# Patient Record
Sex: Female | Born: 1945 | Race: White | Hispanic: No | Marital: Married | Smoking: Former smoker
Health system: Western US, Academic
[De-identification: ages and names within clinical notes are randomized; demographics above are authoritative.]

## PROBLEM LIST (undated history)

## (undated) DIAGNOSIS — M8580 Other specified disorders of bone density and structure, unspecified site: Secondary | ICD-10-CM

## (undated) DIAGNOSIS — G8929 Other chronic pain: Secondary | ICD-10-CM

## (undated) DIAGNOSIS — N3281 Overactive bladder: Secondary | ICD-10-CM

## (undated) DIAGNOSIS — F209 Schizophrenia, unspecified: Secondary | ICD-10-CM

## (undated) DIAGNOSIS — E039 Hypothyroidism, unspecified: Secondary | ICD-10-CM

## (undated) DIAGNOSIS — G4700 Insomnia, unspecified: Secondary | ICD-10-CM

## (undated) DIAGNOSIS — F3130 Bipolar disorder, current episode depressed, mild or moderate severity, unspecified: Secondary | ICD-10-CM

## (undated) DIAGNOSIS — F429 Obsessive-compulsive disorder, unspecified: Secondary | ICD-10-CM

## (undated) HISTORY — DX: Schizophrenia, unspecified (CMS-HCC): F20.9

## (undated) HISTORY — DX: Other chronic pain: G89.29

## (undated) HISTORY — DX: Obsessive-compulsive disorder, unspecified: F42.9

## (undated) HISTORY — DX: Other specified disorders of bone density and structure, unspecified site: M85.80

## (undated) HISTORY — DX: Insomnia, unspecified: G47.00

## (undated) HISTORY — PX: TOTAL KNEE ARTHROPLASTY: SHX125

## (undated) HISTORY — DX: Hypothyroidism, unspecified: E03.9

## (undated) HISTORY — DX: Bipolar disorder, current episode depressed, mild or moderate severity, unspecified (CMS-HCC): F31.30

## (undated) HISTORY — PX: OTHER PROCEDURE: U1053

## (undated) HISTORY — DX: Overactive bladder: N32.81

## (undated) MED ORDER — TRIHEXYPHENIDYL HCL 2 MG OR TABS
ORAL_TABLET | ORAL | Status: AC
Start: 2018-04-29 — End: ?

## (undated) MED ORDER — TRIHEXYPHENIDYL HCL 1 MG OR (HALF TABLET)
1.00 mg | ORAL_TABLET | Freq: Two times a day (BID) | ORAL | 3 refills | Status: AC
Start: 2018-09-17 — End: 2019-01-15

## (undated) MED ORDER — DESVENLAFAXINE SUCCINATE 50 MG OR TB24
50.00 mg | ORAL_TABLET | Freq: Every day | ORAL | 3 refills | Status: AC
Start: 2018-09-17 — End: ?

## (undated) MED ORDER — BUPROPION XL (DAILY) 150 MG OR TB24
150.0000 mg | ORAL_TABLET | Freq: Every morning | ORAL | 0 refills | Status: AC
Start: 2019-05-26 — End: ?

## (undated) MED ORDER — ZONISAMIDE 100 MG OR CAPS
ORAL_CAPSULE | ORAL | 1 refills | Status: AC
Start: 2018-04-18 — End: ?

## (undated) MED ORDER — GABAPENTIN 600 MG OR TABS
1200.00 mg | ORAL_TABLET | Freq: Every evening | ORAL | 3 refills | Status: AC
Start: 2018-09-17 — End: 2019-09-12

## (undated) MED ORDER — SODIUM CHLORIDE FLUSH 0.9 % IV SOLN
10.0000 mL | Freq: Once | INTRAVENOUS | Status: AC
Start: 2019-07-07 — End: 2019-07-07

## (undated) MED ORDER — SODIUM CHLORIDE FLUSH 0.9 % IV SOLN
10.00 mL | Freq: Once | INTRAVENOUS | Status: AC
Start: 2018-12-16 — End: 2018-12-16

## (undated) MED ORDER — PERFLUTREN LIPID MICROSPHERE 1.1 MG/ML IV SUSP
0.4000 mL | Freq: Once | INTRAVENOUS | Status: AC
Start: 2019-07-07 — End: 2019-07-07

## (undated) MED ORDER — SERTRALINE HCL 100 MG OR TABS
100.00 mg | ORAL_TABLET | Freq: Every day | ORAL | 3 refills | Status: AC
Start: 2018-09-17 — End: ?

## (undated) MED ORDER — FLUPHENAZINE HCL 2.5 MG OR TABS
2.50 mg | ORAL_TABLET | Freq: Two times a day (BID) | ORAL | Status: AC
Start: 2018-01-31 — End: ?

## (undated) MED ORDER — PERFLUTREN LIPID MICROSPHERE 1.1 MG/ML IV SUSP
0.40 mL | Freq: Once | INTRAVENOUS | Status: AC
Start: 2018-12-16 — End: 2018-12-16

## (undated) MED ORDER — BUPROPION XL (DAILY) 150 MG OR TB24
ORAL_TABLET | ORAL | 0 refills | Status: AC
Start: 2019-05-26 — End: ?

## (undated) MED ORDER — BUPROPION XL (DAILY) 150 MG OR TB24
150.0000 mg | ORAL_TABLET | Freq: Every morning | ORAL | 0 refills | Status: AC
Start: 2019-05-23 — End: ?

## (undated) MED ORDER — GABAPENTIN 600 MG OR TABS
1200.0000 mg | ORAL_TABLET | Freq: Every evening | ORAL | 3 refills | Status: AC
Start: 2019-05-23 — End: 2020-05-17

---

## 2014-07-21 ENCOUNTER — Inpatient Hospital Stay: Admit: 2014-07-21 | Discharge: 2014-07-21 | Disposition: A | Discharge status: Home Health | Payer: Self-pay

## 2015-03-25 ENCOUNTER — Ambulatory Visit: Payer: Self-pay | Admitting: Dermatology

## 2015-06-17 ENCOUNTER — Inpatient Hospital Stay: Admit: 2015-06-17 | Discharge: 2015-06-17 | Disposition: A | Discharge status: Home Health | Payer: Self-pay

## 2016-07-28 ENCOUNTER — Inpatient Hospital Stay: Admit: 2016-07-28 | Discharge: 2016-07-28 | Disposition: A | Discharge status: Home Health | Payer: Self-pay

## 2017-05-09 ENCOUNTER — Ambulatory Visit: Payer: Medicare Other | Admitting: Psychiatry

## 2017-05-22 ENCOUNTER — Ambulatory Visit: Payer: Medicare Other | Admitting: Psychiatry

## 2017-05-24 ENCOUNTER — Ambulatory Visit: Payer: Self-pay | Admitting: Psychiatry

## 2017-06-06 ENCOUNTER — Ambulatory Visit: Payer: Medicare Other | Admitting: Psychiatry

## 2017-06-22 ENCOUNTER — Ambulatory Visit: Payer: Medicare Other | Admitting: Psychiatry

## 2017-07-02 LAB — HEMOGLOBIN A1C - EXTERNAL: Hemoglobin A1C: 5.2 % — NL (ref 4.8–5.9)

## 2017-07-04 ENCOUNTER — Ambulatory Visit (INDEPENDENT_AMBULATORY_CARE_PROVIDER_SITE_OTHER): Payer: Medicare Other | Admitting: Psychiatry

## 2017-07-04 ENCOUNTER — Encounter: Payer: Self-pay | Admitting: Psychiatry

## 2017-07-04 DIAGNOSIS — F250 Schizoaffective disorder, bipolar type: Principal | ICD-10-CM

## 2017-07-04 MED ORDER — FLUPHENAZINE HCL 2.5 MG OR TABS
2.5000 mg | ORAL_TABLET | Freq: Every evening | ORAL | 2 refills | Status: DC
Start: 2017-07-14 — End: 2017-11-09

## 2017-07-04 MED ORDER — ARIPIPRAZOLE 30 MG OR TABS
30.00 mg | ORAL_TABLET | Freq: Every day | ORAL | Status: DC
Start: ? — End: 2017-07-04

## 2017-07-04 MED ORDER — LIOTHYRONINE SODIUM 5 MCG OR TABS
5.00 ug | ORAL_TABLET | Freq: Every morning | ORAL | Status: DC
Start: ? — End: 2017-08-28

## 2017-07-04 MED ORDER — DESVENLAFAXINE SUCCINATE 50 MG OR TB24
50.0000 mg | ORAL_TABLET | Freq: Every morning | ORAL | 2 refills | Status: DC
Start: 2017-07-14 — End: 2017-10-09

## 2017-07-04 MED ORDER — ZONISAMIDE 100 MG OR CAPS
200.00 mg | ORAL_CAPSULE | Freq: Two times a day (BID) | ORAL | Status: DC
Start: ? — End: 2017-10-09

## 2017-07-04 MED ORDER — DESVENLAFAXINE SUCCINATE 50 MG OR TB24
50.00 mg | ORAL_TABLET | Freq: Every morning | ORAL | 3 refills | Status: DC
Start: 2017-06-14 — End: 2017-07-04

## 2017-07-04 MED ORDER — ARIPIPRAZOLE 30 MG OR TABS
30.0000 mg | ORAL_TABLET | Freq: Every day | ORAL | 2 refills | Status: DC
Start: 2017-07-14 — End: 2017-10-09

## 2017-07-04 MED ORDER — ZIPRASIDONE HCL 40 MG OR CAPS
80.00 mg | ORAL_CAPSULE | Freq: Every evening | ORAL | Status: DC
Start: ? — End: 2017-07-04

## 2017-07-04 MED ORDER — GABAPENTIN 600 MG OR TABS
600.0000 mg | ORAL_TABLET | Freq: Every evening | ORAL | 2 refills | Status: DC
Start: 2017-07-14 — End: 2017-07-13

## 2017-07-04 MED ORDER — LEVOTHYROXINE SODIUM 100 MCG OR TABS
112.50 ug | ORAL_TABLET | Freq: Every morning | ORAL | Status: DC
Start: ? — End: 2017-08-28

## 2017-07-04 MED ORDER — SERTRALINE HCL 50 MG OR TABS
50.0000 mg | ORAL_TABLET | Freq: Every day | ORAL | 2 refills | Status: DC
Start: 2017-07-14 — End: 2017-11-29

## 2017-07-04 MED ORDER — QUETIAPINE FUMARATE 400 MG OR TABS
400.00 mg | ORAL_TABLET | Freq: Every evening | ORAL | Status: DC
Start: ? — End: 2017-07-04

## 2017-07-04 MED ORDER — SERTRALINE HCL 50 MG OR TABS
50.00 mg | ORAL_TABLET | Freq: Every morning | ORAL | Status: DC
Start: ? — End: 2017-07-04

## 2017-07-04 MED ORDER — CLOBETASOL PROPIONATE 0.025 % EX CREA: TOPICAL_CREAM | CUTANEOUS | Status: AC

## 2017-07-04 MED ORDER — GABAPENTIN 600 MG OR TABS
600.00 mg | ORAL_TABLET | Freq: Every evening | ORAL | 4 refills | Status: DC
Start: 2017-06-14 — End: 2017-07-04

## 2017-07-04 MED ORDER — ZIPRASIDONE HCL 40 MG OR CAPS
80.0000 mg | ORAL_CAPSULE | Freq: Every evening | ORAL | 2 refills | Status: DC
Start: 2017-07-14 — End: 2017-07-13

## 2017-07-04 MED ORDER — FLUPHENAZINE HCL 2.5 MG OR TABS
2.50 mg | ORAL_TABLET | Freq: Every evening | ORAL | 3 refills | Status: DC
Start: 2017-06-14 — End: 2017-07-04

## 2017-07-04 MED ORDER — QUETIAPINE FUMARATE 400 MG OR TABS
400.0000 mg | ORAL_TABLET | Freq: Every evening | ORAL | 2 refills | Status: DC
Start: 2017-07-14 — End: 2017-10-09

## 2017-07-04 NOTE — Progress Notes (Signed)
[facility] [address] [facility] [address]  [place], [place] [address]  Fax: [phone]    Psychiatric Evaluation    Identifying Information  Julie Monroe  72 year old  [birth year]    Accompanied by other: No Referred By: Self     Chief Complaint/Reason for Referral: Initial psychiatric evaluation for schizophrenia    History of Present Illness/CURRENT CONCERNS  I had the pleasure of meeting today with Ms. Julie Monroe for an initial psychiatric evaluation. Ms. Andrew comes in today due to concern for the following:     # Schizophrenia vs Schizoaffective  - reports a h/o schizoaffective disorder vs schizophrenia since early 36s  - early 101s: onset symptoms included delusional thinking including paranoid thinking, delusions of reference, delusions about being pregant, erotomanic delusions, overlapping with manic sxs including increased energy, decreased sleep, psychomotor agitation, which culminated with hospitalization to the [facility] and 1972; context was significant for using diet pills and cannabis  - reports a life long hx of multiple episodes of depression and mania  - reports chronic symptoms of psychosis, including chronic ideation about mind reading  Current sxs:  -- rates current depression as 4/10 depression, 6/10 tiredness, critical view of self but normal levels of level of motivation, interest, ability to enjoy things   -- with persistent delusions about one specific individual inserting thoughts and being after her resulting in a degree of functional impairment [locks clothes in suitcases as she believes this individual shrinks her clothes] but otherwise not interfering with others domains of functioning  -- agrees with diagnosis and treatment recommendations and reports adherence with medications     # OCD  - also reports a long h/o checking behaviors, severe obsessive ideation age mid 76s-40s  - current OCD rated as mild, spends about 10 minutes engaging in OCD     #Neurovegative sxs  * Sleep:   - total amount: average 4-5 hrs, no interruptions  - requires sedatives: gabapentin  - time to fall asleep: average minutes to fall asleep  - feels rested upon awakening  * Appetite: "good"    Screening for other major mental health issues  GAD: denies a distinct period or currentexcessive anxiety and worry     Panic Disorder: denies past or current panic attacks    Level of functioning  *social/relationships: with husband and family  *productive activity: retired  *leisure: reading, traveling    GAD-7 4/21  Score Symptom Severity Comments  5-9 Mild   Monitor  10*-14 Moderate  Possible clinically significant condition  >15 Severe   Active treatment probably warranted  *For Panic Disorder, Social Phobia, & PTSD, cutoff score of 8 may be used    PHQ-9 15/27  Score Depression severity Comments  0-4 Minimal or none Monitor - may not require treatment  5-9 Mild   Use clinical judgment (sxs duration, functional impairment) to determine need for Rx  10-14 Moderate  15-19 Moderately severe Warrants active Rx with psychotherapy, meds, or combination  20-27 Severe    Past Psychiatric History  Past suicidal ideation or previous attempts: 37s. SA by OD to "draw attention"  Violence (to self, others/objects): denies  Past diagnoses: schizoaffective    Past Hospitalizations:   - about 7 total psychiatric hospitalizan  - 1st  - Most recently hospitalized in 2012 at [facility] for erotomanic delusions. First hospitalization: age 39    Past Outpatient Treatment: Dr. Heide Scales, Phone: [phone] Date of Last Examination: December 2018  Substance Use/Abuse History  Coffee: about 8 caffeinated drinks a day  Alcohol: negative  Tobacco: negative  Cannabis: negative  Amphetamine: negative  Cocaine: negative  Opioid: negative  Inhalants: negative  Other: negative    Past history of seizures, traumatic brain injury or prolonged loss of consciousness: denies     HIV Risk factors: (Multiple Sex Partners/Unprotected Sexual Activity/IV Drug Use/Blood Transfusions/STD): denies    Reproductive status  Sexually active: denies  Date of LMP: 42    Past Medical History:   Diagnosis Date    Hypothyroidism     Obesity        Past Surgical History:   Procedure Laterality Date    TOTAL KNEE ARTHROPLASTY Bilateral        Allergies: No Known Allergies    Current Meds  Current Outpatient Medications on File Prior to Visit   Medication Sig Dispense Refill    [DISCONTINUED] ARIPiprazole (ABILIFY) 30 MG tablet 30 mg by Oral route daily.      Clobetasol Propionate (IMPOYZ) 0.025 % CREA Apply topically.      [DISCONTINUED] desvenlafaxine (PRISTIQ) 50 MG TB24 Take 50 mg by mouth every morning.  3    [DISCONTINUED] fluphenazine (PROLIXIN) 2.5 MG tablet Take 2.5 mg by mouth at bedtime.  3    [DISCONTINUED] gabapentin (NEURONTIN) 600 MG tablet Take 600 mg by mouth at bedtime.  4    levothyroxine (SYNTHROID) 100 MCG tablet 100 mcg by Oral route every morning.      liothyronine (CYTOMEL) 5 MCG tablet 5 mcg by Oral route every morning.      [DISCONTINUED] quetiapine (SEROQUEL) 400 MG tablet 400 mg by Oral route at bedtime.      [DISCONTINUED] sertraline (ZOLOFT) 50 MG tablet 50 mg by Oral route every morning.      [DISCONTINUED] ziprasidone (GEODON) 40 MG capsule 80 mg by Oral route at bedtime.      zonisamide (ZONEGRAN) 50 MG capsule 100 mg by Oral route 2 times daily.       No current facility-administered medications on file prior to visit.        Family History of Psychiatric Illness:  Bipolar Affective Disorder: negative  Depression: negative  OCD: negative  Anxiety Disorders: negative  Anger Issues: negative  History of Suicide Attempts: negative  History of Suicide Completion: negative  History of Self-Harm: negative  History of Violence: brother  Schizophrenia: two brothers  PTSD: negative  ETOH Abuse:  mother  Other Substance Abuse: negative  Other: negative     PsychoSocial History  Current psychosocial context/Stressors::  - stable living situation, with husband  - stable finances  - married x 35 years; reports supportive relationshi[  - long hx of mental advocacy  - Three children ages 16 , 62, 58 -     Developmental Hx:   Significant maternal pregnancy or delivery events: negative  Missed milestones: negative  Childhood mood: military family; moved every 3 yrs - from birth to 9th grade  Parental conflict/divorce:   - volatile mother; lots of arguments    Trauma Hx (physical/emotional/sexual): negative    Educational Hx:  Highest educational level: college  Special Ed: denies  Learning disability: denies  Repeated grades: denies    Other:  Current Living Situation, Cohabitants: husband  Family and Social Support: husband is supportive  Relationship Hx: (single, married & number of years, number of divorces, widowed, children): married x 35 yrs   Legal Problems: denies  Financial planner: denies  Occupational History: Diplomatic Services operational officer; 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    Examination:  There were no vitals taken for this visit.  General: Cooperative and Good Eye Contact  Muscle strength/tone: No Atrophy or Abnormal Movements  Appearance: Well Groomed and Well Dressed  Speech: Normal Rate, Normal Rhythm, Normal Volume and No pressured speech  Mood: Euthymic  Affect: Restricted and Appropriate  Thought process: Linear and Goal Directed  Thought content: Denies Auditory Hallucinations, Visual Hallucinations; +Delusional Thought Reading  Suicidal Ideation: None  Homicidal Ideation: None  Cognition: Attention: Within normal limits and Ability to abstract: Intact  Insight: Fair  Judgement: Fair  Impulse control: Good    LABS/ OTHER STUDIES  CHEM 7   No results found for: NA, K, CL, CO2, BUN, CR, GLU, [place], MG  LFT's  No results found for: AST, ALT, ALP, ALB, TP, TBIL  CBC  No results found for: WBC, HGB, HCT, PLT  LIPID PANEL  No results found for: CHOL, LDLD, HDL, TRIG  HGBA1c  No components found for: HGBA1C  Miscellaneous   No results found for: TSH, FT4    Assessment   Ms. Woehler is a 72 year old year old woman with a diagnosis of schizoaffective disorder vs. schizopphrenia, presenting with stable mood and chronic, mild psychotic symptoms, on current polypharmacy psychotropic Rx.    From a biological perspective, Ms. Hooley's family history is significant for schizophrenia in two brothers and alcoholism in mother. Ms. Sans symptoms course and current presentation are consistent with a diagnosis of schizoaffective disorder.     Not enough is known at this time to present a comprehensive psychosocial formulation but from a psychological perspective, Ms. Pruiett's childhood was significant for a high level of expressed emotions, in context of mother's alcoholism and a general lack of stability in outside of home relationships as the family moved every 3 years [military].  From a social perspective, except for the chronic stress of chronic mental illness, Ms. Brooker denies any acute stress/stressors.    Suicide risk assessment:  Acute risk factors: none  Modifiable risk factors include psychiatric symptoms  Non-modifiable risk factors include one remote prior suicidal ideation and attempt by OD and history of primary psychotic disorder   Protective factors:future life plans, coping skills, therapeutic relationships, and access to health care.   Risk is mitigated by compliance with medications, engagement with treatment and sobriety.   Overall, Ms. Vanleeuwen suicide risk is assessed to be low in an outpatient treatment setting, with medication compliance and psychiatric follow-up.    Ms. Primo is not an imminent danger to self, danger to others, or gravely  disabled at this time and is safe for continued outpatient treatment.    Diagnosis    ICD-10-CM ICD-9-CM   1. Schizoaffective disorder, bipolar type (CMS-HCC) F25.0 295.70       Treatment Plan  * Level of care: Not a danger to self/others or gravely disabled. Future oriented, contracts for safety. Continue outpatient.    * Labs: CBC_WITH_DIFF, CMP, Thyroid Cascade, B12, homocysteine, folate, thiamine, vitamin D levels to rule out medical contributors - to obtain and review from PCP     * Medical:   # Obesity:  - reviewed metabolic adverse effects of quetiapine with rec for long term goal decrease/stop quetiapine    * Substance/Medication Misuse: N/A    *  Psychiatric:  - Reviewed diagnosis and prognosis. Monitor symptoms.  - Reviewed rationale for treatment, risks and benefits, and alternative treatment options, including no medications alternative. Patient verbally consented for the following psychotropic medications.  - Psychiatric Medication Management:  -- CONTINUE current psychotropics  -- Reviewed increased risk for metabolic complication including obesity with quetiapine  -- Reviewed increased risk for adverse effects with concomitant use of multiple agents from same class  Future directions  Discussed future goal of consolidating to one agent from same class; in light of prolonged stability on these meds and overall good tolerability will not make any changes today but plan for slow and gradual changes to discontiuation of 1. Quetiapine, 2. Sertraline, 3. Another neuroleptic, in this order  - Due to weigh loss benefits consider switching from gabapentin to topiramate  - Informed that provider may be contacted if symptoms worsen or side effects became problematic.    * Emergency: Instructed to call 911 or go to the nearest ED if feeling psychiatrically unstable, suicidal, or homicidal.      Cathie Hoops, MD  Attending Physician

## 2017-07-10 ENCOUNTER — Encounter

## 2017-07-13 ENCOUNTER — Telehealth: Payer: Self-pay | Admitting: Psychiatry

## 2017-07-13 ENCOUNTER — Encounter: Payer: Self-pay | Admitting: Psychiatry

## 2017-07-13 MED ORDER — ZIPRASIDONE HCL 40 MG OR CAPS
80.0000 mg | ORAL_CAPSULE | Freq: Two times a day (BID) | ORAL | 2 refills | Status: DC
Start: 2017-07-13 — End: 2017-10-09

## 2017-07-13 MED ORDER — GABAPENTIN 600 MG OR TABS
600.0000 mg | ORAL_TABLET | Freq: Every evening | ORAL | 2 refills | Status: DC
Start: 2017-07-14 — End: 2017-11-09

## 2017-07-13 NOTE — Telephone Encounter (Signed)
PHARMACY NEEDS TO VERIFY PRESCRIPTION

## 2017-07-13 NOTE — Progress Notes (Signed)
Per [facility], correct dose for ziprazidone is 80 mg BID, gabapentin 600 mg QHS. Updated Rx.

## 2017-07-31 ENCOUNTER — Inpatient Hospital Stay: Admit: 2017-07-31 | Discharge: 2017-07-31 | Disposition: A | Discharge status: Home Health | Payer: Self-pay

## 2017-08-28 ENCOUNTER — Encounter: Payer: Self-pay | Admitting: Psychiatry

## 2017-08-28 ENCOUNTER — Ambulatory Visit (INDEPENDENT_AMBULATORY_CARE_PROVIDER_SITE_OTHER): Payer: Medicare Other | Admitting: Psychiatry

## 2017-08-28 DIAGNOSIS — F250 Schizoaffective disorder, bipolar type: Principal | ICD-10-CM

## 2017-08-28 MED ORDER — LEVOTHYROXINE SODIUM 112 MCG OR TABS
112.00 ug | ORAL_TABLET | Freq: Every day | ORAL | 0 refills | Status: DC
Start: 2017-08-06 — End: 2018-09-17

## 2017-08-28 MED ORDER — CHOLECALCIFEROL 1000 UNIT OR CAPS
1000.00 [IU] | ORAL_CAPSULE | Freq: Every day | ORAL | Status: DC
Start: ? — End: 2018-10-10

## 2017-08-28 NOTE — Patient Instructions (Signed)
-   DECREASE zonisamide/Zonergan to 100 mg twice daily  - Continue other medication without change

## 2017-08-28 NOTE — Progress Notes (Signed)
[facility] [address]  [place], [place] [id number]  Office: [phone]  Fax:  [phone]      Outpatient Progress Note    Lillard AnesChristine Shehadeh   DOB: 1945/08/22    Accompanied by other: No    Chief Complaint: "about the same"/Regularly scheduled follow-up appointment    Identifying Information  Ms. Lillard AnesChristine Fecher is a 72 year old year old woman with a h/o schizoaffective disorder, OCD, and insomnia who presents today for regularly scheduled follow-up appointment.    Interval History  Ms. Habig was last seen in the clinic on 07/04/2017, when she presented with  stability in psychotic and mood symptoms.  At that time, the plan was to   Medication Adherence: Reports adherence  Adverse effects: Weight gain, decreased libido    Context/Stressors  - stable living situation  - supportive marriage    Problems addressed at this visit  #Schizoaffective Disorder  - highlight of past hx:  -- early 3820s: onset symptoms included delusional thinking including paranoid thinking, delusions of reference, delusions about being pregant, erotomanic delusions, overlapping with manic sxs including increased energy, decreased sleep, psychomotor agitation, which culminated with hospitalization to [facility] and 1972; context was significant for using diet pills and cannabis  -- life long hx of multiple episodes of depression and mania  -- reports chronic symptoms of psychosis, including chronic ideation about mind reading    - current sxs:  -- reports stable mood; denies significant depression, anxiety, panic attacks, mood lability or irritability.  -- rates current depression as stable, average 2-3/10 depression, critical view of self but normal levels of level of motivation, interest, ability to enjoy things  - rates energy decreased to 4/10 tiredness  -- denies feelings of hopelessness or SI  -- with persistent delusions about one specific individual inserting  thoughts and being after her resulting in a degree of functional impairment [locks clothes in suitcases as she believes this individual shrinks her clothes] but otherwise not interfering with others domains of functioning  - denies feelings of hopelessness or current suicidal ideation, homicidal ideation, paranoid ideation, auditory and visual hallucinations    # OCD  - long h/o checking behaviors, severe obsessive ideation age mid 5420s-40s  - current OCD rated as persistent but mild, with up 10 minutes engaging in OCD    #Neurovegative sxs  * Sleep:   - total amount: average 4-5 hrs, no interruptions  - requires sedatives: gabapentin  - time to fall asleep: average minutes to fall asleep  - feels rested upon awakening  * Appetite: "good"    Level of functioning  *Social: social isolation, spends time with husband  *Occupational functioning: disability   *Leisure: traveling    Substance Use  Alcohol: none  Coffee: 7 caffeinated drinks a day  Nicotine: none  Cannabis: none  Illicit substances: none    Allergies  No Known Allergies      Current Outpatient Medications on File Prior to Visit   Medication Sig Dispense Refill    ARIPiprazole (ABILIFY) 30 MG tablet Take 1 tablet (30 mg) by mouth daily. 30 tablet 2    Clobetasol Propionate (IMPOYZ) 0.025 % CREA Apply topically.      desvenlafaxine (PRISTIQ) 50 MG TB24 Take 1 tablet (50 mg) by mouth every morning. 30 tablet 2    fluphenazine (PROLIXIN) 2.5 MG tablet Take 1 tablet (2.5 mg) by mouth nightly. 30 tablet 2    gabapentin (NEURONTIN) 600 MG tablet  Take 1 tablet (600 mg) by mouth at bedtime. (Patient taking differently: Take 1,200 mg by mouth at bedtime. ) 60 tablet 2    [DISCONTINUED] levothyroxine (SYNTHROID) 100 MCG tablet 112.5 mcg by Oral route every morning.       levothyroxine (SYNTHROID) 112 MCG tablet Take 112 mcg by mouth daily.  0    [DISCONTINUED] liothyronine (CYTOMEL) 5 MCG tablet 5 mcg by Oral route every morning.       quetiapine (SEROQUEL) 400 MG tablet Take 1 tablet (400 mg) by mouth nightly. 30 tablet 2    sertraline (ZOLOFT) 50 MG tablet Take 1 tablet (50 mg) by mouth daily. 30 tablet 2    vitamin D3 (VITAMIN D3) 1000 units capsule Take 1,000 Units by mouth daily.      ziprasidone (GEODON) 40 MG capsule Take 2 capsules (80 mg) by mouth 2 times daily (with meals). 60 capsule 2    zonisamide (ZONEGRAN) 100 MG capsule 200 mg by Oral route 2 times daily.        No current facility-administered medications on file prior to visit.        Other Social History  Exercise: not regularly  Diet: iregular meals  Lives with: husband; stable living situation  Family/Relationships: main source of support: husband    Additional/Highlights of Past Psychiatric History  No updates    Past Psych Medication Trials  - feels quetiapine is "very effective" together with desvenlafaxine and aripiprazole  - not sure gabapentin is effective      Past Medical History  No updates    Past Surgical History  No updates    Review of Systems  Denies headaches, light-headedness, drowsiness, nausea, vomiting, constipation, diarrhea, SOB or pain    Examination  There were no vitals taken for this visit.  General: Cooperative and Good Eye Contact  Muscle strength/tone: No Atrophy or Abnormal Movements  Appearance: Well Groomed and Well Dressed  Speech: Normal Rate, Normal Rhythm, Normal Volume and No pressured speech  Mood: Euthymic  Affect: Full and Appropriate  Thought process: Linear and Goal Directed  Thought content: No Auditory Hallucinations, Visual Hallucinations, Delusions, or Obsessions  Suicidal Ideation: None  Homicidal Ideation: None  Cognition: Attention: Within normal limits, Ability to abstract: Intact and No overt cognitive deficits noted  Insight: Good  Judgement: Good  Impulse control: Good    LABS/ OTHER STUDIES  CHEM 7  No results found for: NA, K, CL, CO2, BUN, CR, GLU, [place], MG  LFT's  No results found for: AST, ALT, ALP, ALB, TP, TBIL   CBC  No results found for: WBC, HGB, HCT, PLT  LIPID PANEL  No results found for: CHOL, LDLD, HDL, TRIG  HGBA1c  No components found for: HGBA1C  Miscellaneous   No results found for: TSH, FT4    Assessment  Ms. Trivett is a 72 year old year old woman with a diagnosis of schizoaffective disorder vs. schizopphrenia, presenting with stable mood and chronic, mild psychotic symptoms, on current polypharmacy psychotropic Rx.    From a biological perspective, Ms. Loos's family history is significant for schizophrenia in two brothers and alcoholism in mother. Ms. Takach symptoms course and current presentation are consistent with a diagnosis of schizoaffective disorder. She is currently stable on a high-dose complex polypharmacy Rx including 2 atypical neuroleptics, 2 antidepressants, and 2 2nd line mood stabilizers. This combination of psychotropics appears effective but has resulted in obesity, decreased libido, and chronic tiredness.    Ms. Planck childhood was significant  for a high level of expressed emotions, in context of mother's alcoholism and a general lack of stability in outside of home relationships as the family moved every 3 years [military].  From a social perspective, except for the chronic stress of chronic mental illness, Ms. Fleisher denies any acute stress/stressors.    The goal of treatment at this time is to maximize benefits while consolidating and simplifying current psychotropic Rx to improve tolerability.    Suicide risk assessment:  Acute risk factors: none  Modifiable risk factors include psychiatric symptoms  Non-modifiable risk factors include one remote prior suicidal ideation and attempt by OD and history of primary psychotic disorder   Protective factors:future life plans, coping skills, therapeutic relationships, and access to health care.  Risk is mitigated by compliance with medications, engagement with treatment and sobriety.    Overall, Ms. Wissmann suicide risk is assessed to below in an outpatient treatment setting, with medication compliance and psychiatric follow-up.    Ms. Riga is not an imminent danger to self, danger to others, or gravely disabled at this time and is safe for continued outpatient treatment..    Diagnosis    ICD-10-CM ICD-9-CM   1. Schizoaffective disorder, bipolar type (CMS-HCC) F25.0 295.70   2. Obsessive-compulsive disorder, unspecified type F42.9 300.3   3. Insomnia, unspecified type G47.00 780.52   4. Morbid obesity (CMS-HCC) E66.01 278.01       Treatment Plan  * Level of care: Not a danger to self/others or gravely disabled. Future oriented, contracts for safety. Continue outpatient.    * Labs: CBC_WITH_DIFF, CMP, Thyroid Cascade, B12, homocysteine, folate, thiamine, vitamin D - to obtain and review from PCP     * Medical:  # Obesity:  - reviewed diet + exercise recs with goal of 15-30 minutes exercise QD  - reviewed metabolic adverse effects of quetiapine with rec for long term goal decrease/stop quetiapine    * Substance/Medication Misuse: N/A    * Psychiatric:  - Reviewed diagnosis and prognosis. Monitor symptoms.  - Rationale for treatment, risks and benefits, and alternatives treatment options, including no medications alternative were previously discussed with Ms. Raz and she consents to regimen.  Psychiatric Medication Management:  -- DECREASE zonisamide to 100 mg BID  -- Continue fluphenazine 2.5 mg QD, aripiprazole 30 mg QD, ziprazidone 80 mg BID, and quetiapine 400 mg QHS for psychotic symptoms  -- Continue sertraline 50 mg Qd and desvenlafaxine 50 mg QD for OCD and depression  -- Continue gabapentin 1200 mg QHS for mood stabilization and insomnia  Informed that provider may be contacted if symptoms worsen or side effects became problematic.    Therapy:  Continue therapy with me.    * Psychoeducation:  - Reviewed importance of regular physical exercise and diet controlled in  sugar, fat, and calories in context of primary mental health issues and secondary metabolic problems due to psychotropics adverse effects.    * Emergency: Instructed to call 911 or go to the nearest ED if feeling psychiatrically unstable, suicidal, or homicidal.    In-Session Psychotherapy:  I spend 38 minutes providing psychotherapy for this encounter.    Treatment modality(ies) employed: Psychoeducation and supportive    Progress to date: arginal coping skills    Treatment plan: Continue psychoeducation and supportive    Follow-up  Return to follow up in 1 month    Cathie Hoops, MD  Attending Physician

## 2017-10-09 ENCOUNTER — Ambulatory Visit (INDEPENDENT_AMBULATORY_CARE_PROVIDER_SITE_OTHER): Payer: Medicare Other | Admitting: Psychiatry

## 2017-10-09 ENCOUNTER — Encounter: Payer: Self-pay | Admitting: Psychiatry

## 2017-10-09 ENCOUNTER — Other Ambulatory Visit: Payer: Self-pay | Admitting: Psychiatry

## 2017-10-09 DIAGNOSIS — F429 Obsessive-compulsive disorder, unspecified: Secondary | ICD-10-CM

## 2017-10-09 DIAGNOSIS — F250 Schizoaffective disorder, bipolar type: Secondary | ICD-10-CM

## 2017-10-09 DIAGNOSIS — G4700 Insomnia, unspecified: Secondary | ICD-10-CM

## 2017-10-09 MED ORDER — ZONISAMIDE 100 MG OR CAPS
200.0000 mg | ORAL_CAPSULE | Freq: Two times a day (BID) | ORAL | 1 refills | Status: DC
Start: 2017-10-09 — End: 2017-10-09

## 2017-10-09 MED ORDER — DESVENLAFAXINE SUCCINATE 50 MG OR TB24
ORAL_TABLET | ORAL | 2 refills | Status: DC
Start: 2017-10-09 — End: 2018-01-02

## 2017-10-09 MED ORDER — ARIPIPRAZOLE 30 MG OR TABS
30.0000 mg | ORAL_TABLET | Freq: Every day | ORAL | 2 refills | Status: DC
Start: 2017-10-09 — End: 2017-10-09

## 2017-10-09 MED ORDER — ZIPRASIDONE HCL 40 MG OR CAPS
80.0000 mg | ORAL_CAPSULE | Freq: Two times a day (BID) | ORAL | 2 refills | Status: DC
Start: 2017-10-09 — End: 2018-01-02

## 2017-10-09 MED ORDER — QUETIAPINE FUMARATE 400 MG OR TABS
ORAL_TABLET | ORAL | 2 refills | Status: DC
Start: 2017-10-09 — End: 2018-02-20

## 2017-10-09 MED ORDER — QUETIAPINE FUMARATE 400 MG OR TABS
400.0000 mg | ORAL_TABLET | Freq: Every evening | ORAL | 2 refills | Status: DC
Start: 2017-10-09 — End: 2017-10-09

## 2017-10-09 MED ORDER — DESVENLAFAXINE SUCCINATE 50 MG OR TB24
50.0000 mg | ORAL_TABLET | Freq: Every morning | ORAL | 2 refills | Status: DC
Start: 2017-10-09 — End: 2017-10-09

## 2017-10-09 MED ORDER — ZONISAMIDE 100 MG OR CAPS
ORAL_CAPSULE | ORAL | 1 refills | Status: DC
Start: 2017-10-09 — End: 2017-11-29

## 2017-10-09 MED ORDER — ARIPIPRAZOLE 30 MG OR TABS
ORAL_TABLET | ORAL | 2 refills | Status: DC
Start: 2017-10-09 — End: 2017-11-29

## 2017-10-09 NOTE — Progress Notes (Signed)
Outpatient Psychiatry Progress Note      Chief Complaint: Regularly scheduled follow-up appointment      Identifying Information:  Julie Monroe is a 72 year old female last seen on Visit date not found who presents today for a follow-up appointment.     Pt is a 72 y/o F with hx of schizoaffective disorder and obsessive compulsive disorder who transferred care recently to Dr. Hazle Nordmann. Goal has been to simplify her very complex med regimen.     Interval History:   Patient presents for follow up today (regularly sees Dr. Hazle Nordmann as outpatient provider). At time of last visit her zonisamide was decreased and today she reports she tolerated this med adjustment without issue. General plan is to clean up her very complex medication regimen to optimize treatment and reduce pill burden.     Patient without specific complaints today and reports she is feeling well.     Patient has had a hard time getting motivated to exercise. Reports even a ten minute walk is very challenging for her. Asked her to explore other options and she reports she could use the recumbent bike at the gym or use the swimming pool. Validated these as excellent ideas.     Reports a desire to reduce sertraline at present given sexual dysfunction as a side effect.     Reports good sleep and does not endorse depressed mood, s/s c/w mania or any psychotic symptoms.    She denies side effects from and is compliant with current medication regimen.     Substance Use: Denies all.    Reproduction: Not currently pregnant nor is planning on becoming pregnant  Contraception used: Not applicable      Review of Systems:  Denies headaches, nausea, and light-headedness.    Current Outpatient Medications:    Current Outpatient Medications:   .  ARIPiprazole (ABILIFY) 30 MG tablet, Take 1 tablet (30 mg) by mouth daily., Disp: 30 tablet, Rfl: 2  .  Clobetasol Propionate (IMPOYZ) 0.025 % CREA, Apply topically., Disp: , Rfl:   .  desvenlafaxine (PRISTIQ) 50 MG TB24, Take 1  tablet (50 mg) by mouth every morning., Disp: 30 tablet, Rfl: 2  .  fluphenazine (PROLIXIN) 2.5 MG tablet, Take 1 tablet (2.5 mg) by mouth nightly., Disp: 30 tablet, Rfl: 2  .  gabapentin (NEURONTIN) 600 MG tablet, Take 1 tablet (600 mg) by mouth at bedtime. (Patient taking differently: Take 1,200 mg by mouth at bedtime. ), Disp: 60 tablet, Rfl: 2  .  levothyroxine (SYNTHROID) 112 MCG tablet, Take 112 mcg by mouth daily., Disp: , Rfl: 0  .  quetiapine (SEROQUEL) 400 MG tablet, Take 1 tablet (400 mg) by mouth nightly., Disp: 30 tablet, Rfl: 2  .  sertraline (ZOLOFT) 50 MG tablet, Take 1 tablet (50 mg) by mouth daily., Disp: 30 tablet, Rfl: 2  .  vitamin D3 (VITAMIN D3) 1000 units capsule, Take 1,000 Units by mouth daily., Disp: , Rfl:   .  ziprasidone (GEODON) 40 MG capsule, Take 2 capsules (80 mg) by mouth 2 times daily (with meals)., Disp: 60 capsule, Rfl: 2  .  zonisamide (ZONEGRAN) 100 MG capsule, 200 mg by Oral route 2 times daily. , Disp: , Rfl:     Vital Signs:  There were no vitals taken for this visit.    Mental Status Exam:    Appearance: As stated age, good grooming/hygiene, wearing appropriate clothing, obese caucasian female.  Behavior: Cooperative. Fair eye contact. Calm.  Gait: Steady gait  Muscle Strength/Tone: No atrophy or abnormal movements  Speech: Normal rate, and volume. Monotone for the most part.   Mood: "alright"  Affect: Constricted.   Thought Process: Linear, goal-directed         - Associations: Intact  Thought Content: No suicidal or homicidal ideation. Not responding to internal stimuli. No auditory or visual hallucinations.  Attention Span and Concentration: Not distracted in conversation  Language: Able to name objects  Fund of Knowledge: Average  Memory: Both recent and remote memories are intact in context of conversation  Orientation: Intact to person, place  Insight/Judgment: fair      LABS/ OTHER STUDIES    CHEM 7  No results found for: NA, K, CL, CO2, BUN, CR, GLU, [place],  MG  LFT's  No results found for: AST, ALT, ALP, ALB, TP, TBIL  CBC  No results found for: WBC, HGB, HCT, PLT  LIPID PANEL  No results found for: CHOL, LDLD, HDL, TRIG  HGBA1c  No components found for: A1C;3  Miscellaneous   No results found for: TSH, FT4         Scales:   Did not complete    Assessment:   Julie Monroe is demonstrating stability in psychiatric symptoms. Pt is a [age] year old schizoaffective disorder and obsessive compulsive disorder who presents for follow up. Patient responded well to med adjustments at time of last visit and will benefit from continued reduction in the number and dose of medications she is currently taking. Not endorsing affective or psychotic symptoms at present. Will decrease sertraline to 25 mg for two weeks and then have patient discontinue.     She is assessed to be at a low acute risk of suicide.     Clinical Global Impression - Severity: 4= Moderately ill  :Suggested Guidelines= Overt symptomatology causing noticeable, but modest, functional impairment. .    Diagnosis/Diagnoses:     ICD-10-CM ICD-9-CM   1. Obsessive-compulsive disorder, unspecified type F42.9 300.3   2. Schizoaffective disorder, bipolar type (CMS-HCC) F25.0 295.70   3. Insomnia, unspecified type G47.00 780.52       Plan:   Psychiatric Medication Management:    Continue Zonisamide 100 mg BID   Ctoninue fluphenazine 2.5 mg qdaily   Continue aripiprazole30 mg qdaily   Continue ziprasidone 80 mg qdialy    Decrase sertraline to 25 mg qdaily for 2 weeks and then discontinue   Conitnue gabapentin 1200 mg qhs for mood and insomnia   Continue desvenlafaxine 50 mg qdaily   Plan to continue to reduce antipsychotic load at f/u   Medical Issues: Obesity; discussed importance of diet and exercise. Hypothyroidism.   Labs: reviewed.   Psychotherapy: Continue therapy with me.   Counseled on diet and exercise   Instructed patient to call 911 or go to the nearest ED if feeling psychiatrically unstable, suicidal, or  homicidal.   RTC in 4 weeks or sooner PRN    Risks, benefits, and alternatives of medications were discussed today with Julie Monroe and she consents to regimen.    Therapy:  Continue therapy with me.       In-session add-on psychotherapy:  Goals: improve psychoeducation, self-esteem.    Treatment modality(ies) employed: Supportive therapy    Progress to date: more assertive    Treatment plan: Continue psychotherapy to augment medication management      I spent 17 minutes providing psychotherapy during this encounter.    The patient was seen and examined with Attending Physician, Dr. Hazle NordmannPreda, who was present and  participated in the key and critical portions of the exam. We jointly formulated the assessment and plan, discussed the patient's case, and agree with the finding as documented above.    Note Author: Frutoso Chase, MD  Resident Physician  Pager ID: [phone]

## 2017-10-09 NOTE — Telephone Encounter (Signed)
90 day supply approved

## 2017-10-14 NOTE — Addendum Note (Signed)
Addended by: Cathie Hoops on: 10/14/2017 11:06 AM     Modules accepted: Level of Service

## 2017-10-14 NOTE — Progress Notes (Signed)
Attending Attestation:  I was present with the resident during the the key and critical portions of history and exam. I discussed the case with the resident and agree with the findings, assessment, and plan as documented by the resident. I provided supervision for supportive and other psychotherapy modalities employed to augment medication management. My additions or revisions are included in the record.     Julie Monroe presents for scheduled follow-up in our outpatient clinic. She endorses stability in her psychiatric symptoms.     Assessment  The primary encounter diagnosis was Obsessive-compulsive disorder, unspecified type. Diagnoses of Schizoaffective disorder, bipolar type (CMS-HCC) and Insomnia, unspecified type were also pertinent to this visit.     Plan  Continue outpatient.  I agree with plan to:  ? Continue Zonisamide 100 mg BID  ? Ctoninue fluphenazine 2.5 mg qdaily  ? Continue aripiprazole30 mg qdaily  ? Continue ziprasidone 80 mg qdialy   ? Decrase sertraline to 25 mg qdaily for 2 weeks and then discontinue  ? Conitnue gabapentin 1200 mg qhs for mood and insomnia  ? Continue desvenlafaxine 50 mg qdaily    Reviewed rationale for treatment, risks and benefits, and alternative treatment options, including no medications alternative.    Other recommendations as per the resident's note.    Cathie Hoops, MD   Attending Physician

## 2017-11-06 ENCOUNTER — Ambulatory Visit: Payer: Medicare Other | Admitting: Psychiatry

## 2017-11-09 ENCOUNTER — Encounter: Payer: Self-pay | Admitting: Psychiatry

## 2017-11-09 MED ORDER — GABAPENTIN 600 MG OR TABS
1200.0000 mg | ORAL_TABLET | Freq: Every evening | ORAL | 0 refills | Status: DC
Start: 2017-11-09 — End: 2017-11-29

## 2017-11-09 MED ORDER — FLUPHENAZINE HCL 2.5 MG OR TABS
2.5000 mg | ORAL_TABLET | Freq: Every evening | ORAL | 0 refills | Status: DC
Start: 2017-11-09 — End: 2017-11-29

## 2017-11-09 NOTE — Progress Notes (Signed)
Renewed Rx 90-day for fluphenazine 2.5 mg QD and gabapentin 1200 mg QHS.

## 2017-11-16 ENCOUNTER — Ambulatory Visit: Payer: Medicare Other | Admitting: Psychiatry

## 2017-11-29 ENCOUNTER — Encounter: Payer: Self-pay | Admitting: Psychiatry

## 2017-11-29 ENCOUNTER — Ambulatory Visit (INDEPENDENT_AMBULATORY_CARE_PROVIDER_SITE_OTHER): Payer: Medicare Other | Admitting: Psychiatry

## 2017-11-29 DIAGNOSIS — G4700 Insomnia, unspecified: Secondary | ICD-10-CM

## 2017-11-29 DIAGNOSIS — F250 Schizoaffective disorder, bipolar type: Secondary | ICD-10-CM

## 2017-11-29 DIAGNOSIS — F429 Obsessive-compulsive disorder, unspecified: Secondary | ICD-10-CM

## 2017-11-29 MED ORDER — ZONISAMIDE 100 MG OR CAPS
200.0000 mg | ORAL_CAPSULE | Freq: Every day | ORAL | 1 refills | Status: DC
Start: 2017-11-29 — End: 2018-04-03

## 2017-11-29 MED ORDER — ARIPIPRAZOLE 30 MG OR TABS
30.00 mg | ORAL_TABLET | Freq: Every day | ORAL | 2 refills | Status: DC
Start: 2017-11-29 — End: 2018-01-02

## 2017-11-29 MED ORDER — TRIHEXYPHENIDYL HCL 1 MG OR (HALF TABLET)
1.0000 mg | ORAL_TABLET | Freq: Every day | ORAL | 2 refills | Status: DC
Start: 2017-11-29 — End: 2018-01-02

## 2017-11-29 MED ORDER — SERTRALINE HCL 50 MG OR TABS
50.0000 mg | ORAL_TABLET | Freq: Every day | ORAL | 2 refills | Status: DC
Start: 2017-11-29 — End: 2018-02-07

## 2017-11-29 MED ORDER — FLUPHENAZINE HCL 2.5 MG OR TABS
2.5000 mg | ORAL_TABLET | Freq: Every evening | ORAL | 0 refills | Status: DC
Start: 2017-11-29 — End: 2018-01-31

## 2017-11-29 MED ORDER — GABAPENTIN 600 MG OR TABS
1200.0000 mg | ORAL_TABLET | Freq: Every evening | ORAL | 1 refills | Status: DC
Start: 2017-11-29 — End: 2018-03-12

## 2017-11-29 NOTE — Progress Notes (Signed)
Outpatient Psychiatry Progress Note      Chief Complaint: Regularly scheduled follow-up appointment      Identifying Information:  Julie Monroe is a 72 year old female last seen on Visit date not found who presents today for a follow-up appointment.     Pt is a 72 y/o F with hx of schizoaffective disorder and obsessive compulsive disorder who transferred care recently to Dr. Hazle Nordmann. Goal has been to simplify her very complex med regimen.     Interval History:   Patient presents for follow up today. At time of last visit sertraline was discontinued (sexual side effects).     Today pt reports some lower mood w/o improved libido since stopping the sertraline.     Mood: notes lower mood since stopping the sertraline. Sleep is okay but some sadness and feeling withdrawn. Does not endorse SI/HI. Does not endorse hopelessness or guilt.     Sleep: no impairments at present.     Psychosis: does not endorse AVH/PI today. Appears preoccupied at times.     Mania: does not endorse grandiosity, does not endorse reduced need for sleep, does not endorse pressured speech, does not endorse increased impulsive behavior.     She denies side effects from and is compliant with current medication regimen.     Substance Use: Denies all.    Reproduction: Not currently pregnant nor is planning on becoming pregnant  Contraception used: Not applicable      Review of Systems:  Denies headaches, nausea, and light-headedness.    Current Outpatient Medications:    Current Outpatient Medications:   .  ARIPiprazole (ABILIFY) 30 MG tablet, TAKE 1 TABLET(30 MG) BY MOUTH DAILY, Disp: 90 tablet, Rfl: 2  .  Clobetasol Propionate (IMPOYZ) 0.025 % CREA, Apply topically., Disp: , Rfl:   .  desvenlafaxine (PRISTIQ) 50 MG TB24, TAKE 1 TABLET(50 MG) BY MOUTH EVERY MORNING, Disp: 90 tablet, Rfl: 2  .  fluphenazine (PROLIXIN) 2.5 MG tablet, Take 1 tablet (2.5 mg) by mouth nightly., Disp: 90 tablet, Rfl: 0  .  gabapentin (NEURONTIN) 600 MG tablet, Take 2  tablets (1,200 mg) by mouth at bedtime., Disp: 180 tablet, Rfl: 0  .  levothyroxine (SYNTHROID) 112 MCG tablet, Take 112 mcg by mouth daily., Disp: , Rfl: 0  .  quetiapine (SEROQUEL) 400 MG tablet, TAKE 1 TABLET(400 MG) BY MOUTH EVERY NIGHT, Disp: 90 tablet, Rfl: 2  .  sertraline (ZOLOFT) 50 MG tablet, Take 1 tablet (50 mg) by mouth daily., Disp: 30 tablet, Rfl: 2  .  vitamin D3 (VITAMIN D3) 1000 units capsule, Take 1,000 Units by mouth daily., Disp: , Rfl:   .  ziprasidone (GEODON) 40 MG capsule, Take 2 capsules (80 mg) by mouth 2 times daily (with meals)., Disp: 60 capsule, Rfl: 2  .  zonisamide (ZONEGRAN) 100 MG capsule, TAKE 2 CAPSULES(200 MG) BY MOUTH TWICE DAILY, Disp: 385 capsule, Rfl: 1    Vital Signs:  There were no vitals taken for this visit.    Mental Status Exam:    Appearance: As stated age, good grooming/hygiene, wearing appropriate clothing, obese caucasian female.  Behavior: Cooperative. Fair eye contact. Calm.  Gait: Steady gait  Muscle Strength/Tone: No atrophy or abnormal movements  Speech: Normal rate, and volume. Monotone for the most part.   Mood: pretty good but a little low"  Affect: Constricted.   Thought Process: Linear, goal-directed         - Associations: Intact  Thought Content: No suicidal or homicidal  ideation. Not responding to internal stimuli. No auditory or visual hallucinations.  Attention Span and Concentration: Not distracted in conversation  Language: Able to name objects  Fund of Knowledge: Average  Memory: Both recent and remote memories are intact in context of conversation  Orientation: Intact to person, place  Insight/Judgment: fair      LABS/ OTHER STUDIES    CHEM 7  No results found for: NA, K, CL, CO2, BUN, CR, GLU, [place], MG  LFT's  No results found for: AST, ALT, ALP, ALB, TP, TBIL  CBC  No results found for: WBC, HGB, HCT, PLT  LIPID PANEL  No results found for: CHOL, LDLD, HDL, TRIG  HGBA1c  No components found for: A1C;3  Miscellaneous   No results found for: TSH,  FT4         Scales:   Did not complete    Assessment:   [name] is demonstrating stability in psychiatric symptoms. Pt is a 73 year old schizoaffective disorder and obsessive compulsive disorder who presents for follow up. Patient responded well to med adjustments at time of last visit and will benefit from continued reduction in the number and dose of medications she is currently taking.     Today patient reports some worsening depression since stopping the sertraline without benefit with respect to libido. Otherwise without complaint and stable. Will restart sertraline and assess at follow up.     She is assessed to be at a low acute risk of suicide.     Clinical Global Impression - Severity: 4= Moderately ill  :Suggested Guidelines= Overt symptomatology causing noticeable, but modest, functional impairment. .    Diagnosis/Diagnoses:     ICD-10-CM ICD-9-CM   1. Schizoaffective disorder, bipolar type (CMS-HCC) F25.0 295.70   2. Obsessive-compulsive disorder, unspecified type F42.9 300.3   3. Insomnia, unspecified type G47.00 780.52       Plan:   Psychiatric Medication Management:    Continue Zonisamide 100 mg BID   Ctoninue fluphenazine 2.5 mg qdaily   Continue aripiprazole30 mg qdaily   Continue ziprasidone 80 mg qdialy    Conitnue gabapentin 1200 mg qhs for mood and insomnia   Continue desvenlafaxine 50 mg qdaily   Restart Artane 1 mg qdaily for EPS   Restart sertraline 50 mg qdaily (depression worsened after stopping medication).   Medical Issues: Obesity; discussed importance of diet and exercise. Hypothyroidism.   Labs: reviewed.   Psychotherapy: Continue therapy with me.   Counseled on diet and exercise   Instructed patient to call 911 or go to the nearest ED if feeling psychiatrically unstable, suicidal, or homicidal.   RTC in 4 weeks or sooner PRN    Risks, benefits, and alternatives of medications were discussed today with [name] and she consents to regimen.    Therapy:  Continue  therapy with me.       In-session add-on psychotherapy:  Goals: improve psychoeducation, self-esteem.    Treatment modality(ies) employed: Supportive therapy    Progress to date: more assertive    Treatment plan: Continue psychotherapy to augment medication management      I spent 17 minutes providing psychotherapy during this encounter.    The patient was seen and examined with Attending Physician, Dr. Hazle Nordmann, who was present and participated in the key and critical portions of the exam. We jointly formulated the assessment and plan, discussed the patient's case, and agree with the finding as documented above.    Note Author: Jenne Campus, MD  Resident Physician  Pager ID: [phone]

## 2017-12-03 NOTE — Progress Notes (Signed)
Attending Attestation:  I was present with the resident during the the key and critical portions of history and exam. I discussed the case with the resident and agree with the findings, assessment, and plan as documented by the resident. I provided supervision for supportive and other psychotherapy modalities employed to augment medication management. My additions or revisions are included in the record.     Julie Monroe presents for scheduled follow-up in our outpatient clinic. She endorses stability in her psychiatric symptoms.     Assessment  The primary encounter diagnosis was Schizoaffective disorder, bipolar type (CMS-HCC). Diagnoses of Obsessive-compulsive disorder, unspecified type and Insomnia, unspecified type were also pertinent to this visit.     Plan  Continue outpatient.  I agree with plan to:   ? Continue Zonisamide 100 mg BID  ? Ctoninue fluphenazine 2.5 mg qdaily  ? Continue aripiprazole30 mg qdaily  ? Continue ziprasidone 80 mg qdialy   ? Conitnue gabapentin 1200 mg qhs for mood and insomnia  ? Continue desvenlafaxine 50 mg qdaily  ? Restart Artane 1 mg qdaily for EPS  ? Restart sertraline 50 mg qdaily (depression worsened after stopping medication).    Reviewed rationale for treatment, risks and benefits, and alternative treatment options, including no medications alternative.    Other recommendations as per the resident's note.    Cathie Hoops, MD   Attending Physician

## 2017-12-03 NOTE — Addendum Note (Signed)
Addended by: Cathie Hoops on: 12/03/2017 12:54 PM     Modules accepted: Level of Service

## 2017-12-05 LAB — HEMOGLOBIN A1C - EXTERNAL: Hemoglobin A1C: 5.2 % — NL (ref 4.8–5.9)

## 2018-01-02 ENCOUNTER — Ambulatory Visit (INDEPENDENT_AMBULATORY_CARE_PROVIDER_SITE_OTHER): Payer: Medicare Other | Admitting: Psychiatry

## 2018-01-02 DIAGNOSIS — F250 Schizoaffective disorder, bipolar type: Secondary | ICD-10-CM

## 2018-01-02 DIAGNOSIS — F419 Anxiety disorder, unspecified: Secondary | ICD-10-CM | POA: Insufficient documentation

## 2018-01-02 DIAGNOSIS — F429 Obsessive-compulsive disorder, unspecified: Secondary | ICD-10-CM

## 2018-01-02 MED ORDER — ARIPIPRAZOLE 30 MG OR TABS
30.0000 mg | ORAL_TABLET | Freq: Every day | ORAL | 2 refills | Status: DC
Start: 2018-01-02 — End: 2018-07-11

## 2018-01-02 MED ORDER — DESVENLAFAXINE SUCCINATE 50 MG OR TB24
50.0000 mg | ORAL_TABLET | Freq: Every day | ORAL | 2 refills | Status: DC
Start: 2018-01-02 — End: 2018-03-12

## 2018-01-02 MED ORDER — TRIHEXYPHENIDYL HCL 1 MG OR (HALF TABLET)
1.0000 mg | ORAL_TABLET | Freq: Every day | ORAL | 2 refills | Status: DC
Start: 2018-01-02 — End: 2018-02-07

## 2018-01-02 MED ORDER — ZIPRASIDONE HCL 40 MG OR CAPS
80.0000 mg | ORAL_CAPSULE | Freq: Two times a day (BID) | ORAL | 2 refills | Status: DC
Start: 2018-01-02 — End: 2018-08-27

## 2018-01-02 NOTE — Progress Notes (Addendum)
Outpatient Psychiatry Progress Note      Chief Complaint:   Regularly scheduled follow-up appointment    Identifying Information:  Julie Monroe is a 72 year old female last seen on Visit date not found who presents today for a follow-up appointment.     Interval History:  Julie Monroe is a 72 year old F with a past psychiatric history of schizoaffective d/o, bipolar type, OCD and unspecified insomnia who presents for follow-up, accompanied by pt husband. Pt reports mood, "Good", and denies suicidal ideation, intent and plan. Pt reports mood "good" overall, with intermittent "depression and anxiety". Pt vaguely reports on "a relationship I've had since the 1980s", and states that it is "a complicated relationship". Pt unwilling to provide more details, but reports that pt husband is aware of "relationship". Pt husband, Julie Monroe, and previous provider (Dr. Cathie Hoops) later verified that pt is referring to long-standing delusion. Pt reports that other partner in this relationship is "transmitting thoughts", though pt denies pt is being commanded, esp to take self-harm or assaultive action. Pt denies suicidal, homicidal and assaultive ideation, intent and plan. Pt reports compliance with medications as directed, and denies AEs. Pt reports, however, ongoing lip and L>R upper extremity tremulousness, intervally improved since starting Artane. Pt reports tremor does not impair her functionality. Pt reports first psychotic episode 1971, and reports no past neuroleptic trial of clozapine. Pt and pt husband discussed risks, benefits and alternate treatments wrt clozapine, incl discussion of scheduled labs and increased contact with provider. Pt and pt husband also provided printed materials regarding clozapine. Pt and pt husband amenable to considering starting at future encounter.    Psychiatric Review of Systems: Pt does not endorse current or recent decreased need for sleep or increased energy > 4 d,  hyperverbality, racing thoughts, grandiosity and goal-directed bevavior c/w manic or hypomanic episode at this encounter.    Depressive Neurovegetative Symptoms Screen: Pt does not endorse decreased energy, difficulty sleeping, decreased interest and noticeable psychomotor retardation at this encounter.    Review of Systems:  Denies fever, chills, chest pain, dyspnea, headaches, nausea, and light-headedness.      Current Outpatient Medications:   .  ARIPiprazole (ABILIFY) 30 MG tablet, Take 1 tablet (30 mg) by mouth daily., Disp: 90 tablet, Rfl: 2  .  Clobetasol Propionate (IMPOYZ) 0.025 % CREA, Apply topically., Disp: , Rfl:   .  desvenlafaxine (PRISTIQ) 50 MG TB24, Take 1 tablet (50 mg) by mouth daily., Disp: 90 tablet, Rfl: 2  .  fluphenazine (PROLIXIN) 2.5 MG tablet, Take 1 tablet (2.5 mg) by mouth nightly., Disp: 90 tablet, Rfl: 0  .  gabapentin (NEURONTIN) 600 MG tablet, Take 2 tablets (1,200 mg) by mouth at bedtime., Disp: 180 tablet, Rfl: 1  .  levothyroxine (SYNTHROID) 112 MCG tablet, Take 112 mcg by mouth daily., Disp: , Rfl: 0  .  quetiapine (SEROQUEL) 400 MG tablet, TAKE 1 TABLET(400 MG) BY MOUTH EVERY NIGHT, Disp: 90 tablet, Rfl: 2  .  sertraline (ZOLOFT) 50 MG tablet, Take 1 tablet (50 mg) by mouth daily., Disp: 90 tablet, Rfl: 2  .  trihexyphenidyl (ARTANE) 1 MG tablet, Take 1 each (1 mg) by mouth daily., Disp: 90 tablet, Rfl: 2  .  vitamin D3 (VITAMIN D3) 1000 units capsule, Take 1,000 Units by mouth daily., Disp: , Rfl:   .  ziprasidone (GEODON) 40 MG capsule, Take 2 capsules (80 mg) by mouth 2 times daily (with meals)., Disp: 60 capsule, Rfl: 2  .  zonisamide (ZONEGRAN) 100 MG capsule, Take 2 capsules (200 mg) by mouth daily., Disp: 180 capsule, Rfl: 1      Vital Signs:  There were no vitals taken for this visit.      Mental Status Exam:    Appearance: As stated age, good grooming/hygiene, wearing appropriate clothing  Behavior: Cooperative. Fair eye contact. Calm.  Gait: Steady gait  Muscle  Strength/Tone: No atrophy or abnormal movements  Speech: Normal rate, tone and volume  Mood: "Good", though reports intermittent "depression and anxiety", as above  Affect: Mildly to moderately blunted  Thought Process: Superficially linear       - Associations: Intact, with mild loosening  Thought Content: No auditory or visual hallucinations, though reports thought transmission and endorses stable, long-standing delusion.    Denies suicidal or homicidal ideation. Not responding to internal stimuli.   Attention Span and Concentration: Not distracted in conversation  Language: Able to name objects  Fund of Knowledge: Average  Memory: Both recent and remote memories are intact in context of conversation  Orientation: Intact to person, place and situation  Insight/Judgment: Impaired    Gross Neurological Exam:  Lip, lingular and L>R upper extremity tremor  Blunted facies  No muscle stiffness, and mild bradykinesia oberved    LABS/ OTHER STUDIES    CHEM 7  No results found for: NA, K, CL, CO2, BUN, CR, GLU, [place], MG  LFT's  No results found for: AST, ALT, ALP, ALB, TP, TBIL  CBC  No results found for: WBC, HGB, HCT, PLT  LIPID PANEL  No results found for: CHOL, LDLD, HDL, TRIG  HGBA1c  No components found for: A1C;3  Miscellaneous   No results found for: TSH, FT4     Scales:   No flowsheet data found.  No flowsheet data found.      Assessment:   Julie Monroe is a 72 year old F with a past psychiatric history of schizoaffective d/o, bipolar type, OCD and unspecified insomnia who presents for follow-up, accompanied by pt husband. Pt demonstrates chronic, stable psychotic delusion, with some evidence of cognitive decline, thought process disorganization and affective blunting. Pt started and stabilized on multiple antipsychotics by outside provider. 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 Risk-benefit analysis is considered to favor continued trihedyphenidyl use esp given improvement in EPS, though anticholinergic burden should be minimized when able.     She is assessed to be at a low acute risk of suicide.   Clinical Global Impression - Severity: 4= Moderately ill  :Suggested Guidelines= Overt symptomatology causing noticeable, but modest, functional impairment. .    Diagnosis/Diagnoses:     ICD-10-CM ICD-9-CM   1. Schizoaffective disorder, bipolar type (CMS-HCC) F25.0 295.70   2. Obsessive-compulsive disorder, unspecified type F42.9 300.3   3. Anxiety F41.9 300.00       Plan:  Medication Management: Risks, benefits, and alternatives of medications were discussed today with Julie Monroe and she consents to regimen.   Order CBC + diff & CMP   Order EKG  ? Defer to PCP   Consider clozapine, pending baseline labs and EKG   Continue fluphenazine 2.5 mg PO qDaily   Continue aripiprazole 30 mg PO qDaily   Continue ziprasidone 80 mg PO qDaily    Continue trihedyphenidyl (Artane) 1 mg PO qDaily for EPS   Continue gabapentin 1200 mg PO QHS for insomnia   Continue desvenlafaxine 50 mg  qdaily   Continue sertraline 50 mg qdaily   ? Note: Depression worsened after stopping medication    In-session psychotherapy:  -- Extensive psychoeducation re clozapine and cognitive effects of anticholinergics discussed  Progress to date: Improving  Treatment plan: Continue psychoeducation and supportive    Note Author:  Elayne GuerinAndrew Chunkil Jaylissa Felty, MD  Resident/Fellow Physician

## 2018-01-09 NOTE — Addendum Note (Signed)
Addended by: Cathie HoopsPREDA, Kareen Hitsman on: 01/09/2018 04:21 PM     Modules accepted: Level of Service

## 2018-01-09 NOTE — Progress Notes (Signed)
Attending Attestation:  I was present with the trainee during the the key portions of the exam.  I participated in the medical decision making and management of the patient. I agree with the findings and plan as documented.  I provided supervision for supportive and other psychotherapy modalities employed. My additions or revisions are included in the record as needed.     Jane Birkel, MD  Attending Physician  Professor of Clinical Psychiatry

## 2018-01-21 ENCOUNTER — Ambulatory Visit: Payer: Medicare Other | Admitting: Urology

## 2018-01-28 ENCOUNTER — Telehealth: Payer: Self-pay | Admitting: Urology

## 2018-01-28 ENCOUNTER — Ambulatory Visit (INDEPENDENT_AMBULATORY_CARE_PROVIDER_SITE_OTHER): Payer: Medicare Other | Admitting: Urology

## 2018-01-28 ENCOUNTER — Encounter: Payer: Self-pay | Admitting: Urology

## 2018-01-28 ENCOUNTER — Ambulatory Visit: Payer: Medicare Other | Admitting: Urology

## 2018-01-28 VITALS — BP 138/77 | HR 68 | Temp 98.3°F | Ht 66.0 in | Wt 250.0 lb

## 2018-01-28 DIAGNOSIS — N3941 Urge incontinence: Principal | ICD-10-CM

## 2018-01-28 LAB — URINALYSIS 8, POINT OF CARE TESTING
Bld, UA POCT: NEGATIVE
Glucose, UA POCT: NEGATIVE mg/dL
Ketone, UA POCT: NEGATIVE mg/dL
Leukocytes, UA POCT: NEGATIVE
Nitrite, UA POCT: NEGATIVE
Protein, UA POCT: NEGATIVE mg/dL
Specific Gravity, UA (POCT): 1.02 (ref 1.003–1.030)
pH, UA POCT: 7 (ref 5.0–8.0)

## 2018-01-28 MED ORDER — LIDOCAINE HCL 2 % EX GEL (UROJET) (~~LOC~~)
1.00 | Freq: Once | CUTANEOUS | Status: AC
Start: 2018-01-28 — End: 2018-01-28
  Administered 2018-01-28: 1 via URETHRAL

## 2018-01-28 MED ORDER — TROSPIUM CHLORIDE 60 MG OR CP24
60.0000 mg | ORAL_CAPSULE | Freq: Every day | ORAL | 2 refills | Status: DC
Start: 2018-01-28 — End: 2018-01-30

## 2018-01-28 NOTE — Patient Instructions (Signed)
Conservative Treatment for Overactive Bladder      Many people experience symptoms of overactive bladder, including urinary frequency, urgency, and urine leakage. Approximately 50% will improve with conservative measures only.     1. Monitor your fluid intake  a. Most people require 4-8 glasses of fluid daily. Drink to thirst and stay hydrated such that your urine is a light lemonade color.   b. Avoid drinking large volumes of fluid all at once.   c. Avoid caffeinated beverages and alcohol, especially at night.  d. If you wake up more than once per night to urinate, stop drinking fluids after dinner time. If you find that you are thirsty or need to take a medication, take very small sips of fluid only.    2. Bladder Retraining  a. Keep a voiding diary to record your frequency   b. Use a timed voiding schedule, typically starting with 15 minutes longer than your normal frequency. For example, if you usually void every hour start with every hour and 15 minutes.  c. If you feel the urge to urinate prior to the designated time try to distract yourself. Typically, the urge will subside in several minutes. Try counting backwards from 100 until the urge subsides.  d. When you feel an urge to urinate try to stay relaxed and still. Some people find it helpful to cross their legs or lean forward a bit.  e. Start by extending the voiding interval by 5 minutes. Once you are able to go an extra 5 minutes gradually increase the interval by 15 minutes each week.  f. When possible, avoid "convenience" trips to the bathroom when you have just emptied your bladder. Most people can hold more than 12 ounces of urine and you are teaching your bladder that it can do the same.  g. Bladder retraining can take 6-8 weeks prior to seeing results. Avoid feeling discouraged and keep trying!    3. Pelvic Floor Muscle Exercises may be used with or without pelvic floor physical therapy    4. Weight Loss can be very helpful for patients who are  overweight or obese.       We will discuss your progress at your follow up appointment. If you do not have satisfactory improvement in your symptoms, many more options are available.

## 2018-01-28 NOTE — Progress Notes (Signed)
[facility]  [address]  [place], [place] [id number]  [phone]      Urology Initial Visit    Visit Date: 01/28/2018    Referring Provider: Ronalee RedBilan, Natalia, MD  [phone]  [place], [place] [id number]    Dear Dr. Cecilio AsperBilan,    Thank you for your referral of Ms. Ramp to our office. My progress note is below.  Please contact me with any questions or concerns, and I will keep you apprised of her progress.    Sincerely,  Dennie Bibleena E Alianna Wurster, MD    Primary Care Provider: Ronalee RedBilan, Natalia    History of Present Illness    Ms. Kesecker is a 72 year old G4P3  who describes a 1 year history of  urinary incontinence requiring 1 pad per day for protection which is about 50%soaked. She says she usually does not feel the urge to urinate and leaks.           She  denies dysuria or hematuria.  She feels as though she is emptying her bladder to completion.  She has denies obstructive voiding symptoms with hesitancy,  intermittency, and a decreased force of stream.       Daytime frequency is every 2 hours. Denies nocturia but has enuresis because she does not feel the urge to urinate.   She consumes 16oz of caffeine and 2 cans of soda daily. She does not drink water. Denies alcohol intake.      She denies symptoms of pelvic prolapse.  She denies stool trapping requiring splinting with bowel movements.      She denies fecal incontinence.    She is not sexually active    Past Medical History:   Diagnosis Date   . Hypothyroid    . Hypothyroidism    . Schizophrenia (CMS-HCC)        Past Surgical History:   Procedure Laterality Date   . Biilat. Knee replacement     . Rt toe surgery     . TOTAL KNEE ARTHROPLASTY Bilateral          Current Outpatient Medications:   .  ARIPiprazole (ABILIFY) 30 MG tablet, Take 1 tablet (30 mg) by mouth daily., Disp: 90 tablet, Rfl: 2  .  Clobetasol Propionate (IMPOYZ) 0.025 % CREA, Apply topically., Disp: , Rfl:   .  desvenlafaxine (PRISTIQ) 50 MG TB24, Take 1 tablet  (50 mg) by mouth daily., Disp: 90 tablet, Rfl: 2  .  fluphenazine (PROLIXIN) 2.5 MG tablet, Take 1 tablet (2.5 mg) by mouth nightly., Disp: 90 tablet, Rfl: 0  .  gabapentin (NEURONTIN) 600 MG tablet, Take 2 tablets (1,200 mg) by mouth at bedtime., Disp: 180 tablet, Rfl: 1  .  levothyroxine (SYNTHROID) 112 MCG tablet, Take 112 mcg by mouth daily., Disp: , Rfl: 0  .  quetiapine (SEROQUEL) 400 MG tablet, TAKE 1 TABLET(400 MG) BY MOUTH EVERY NIGHT, Disp: 90 tablet, Rfl: 2  .  sertraline (ZOLOFT) 50 MG tablet, Take 1 tablet (50 mg) by mouth daily., Disp: 90 tablet, Rfl: 2  .  trihexyphenidyl (ARTANE) 1 MG tablet, Take 1 each (1 mg) by mouth daily., Disp: 90 tablet, Rfl: 2  .  trospium (SANCTURA XR) 60 MG Sustained-Release capsule, Take 1 capsule (60 mg) by mouth daily., Disp: 30 capsule, Rfl: 2  .  vitamin D3 (VITAMIN D3) 1000 units capsule, Take 1,000 Units by mouth daily., Disp: , Rfl:   .  ziprasidone (  GEODON) 40 MG capsule, Take 2 capsules (80 mg) by mouth 2 times daily (with meals)., Disp: 60 capsule, Rfl: 2  .  zonisamide (ZONEGRAN) 100 MG capsule, Take 2 capsules (200 mg) by mouth daily., Disp: 180 capsule, Rfl: 1  No current facility-administered medications for this visit.     Social History     Socioeconomic History   . Marital status: Married     Spouse name: Not on file   . Number of children: Not on file   . Years of education: Not on file   . Highest education level: Not on file   Occupational History   . Not on file   Tobacco Use   . Smoking status: Former Smoker     Packs/day: 2.00     Years: 30.00     Pack years: 60.00   . Smokeless tobacco: Never Used   Substance and Sexual Activity   . Alcohol use: No     Frequency: Never   . Drug use: No   . Sexual activity: Never   Social Activities of Daily Living Present   . Not on file   Social History Narrative   . Not on file       Family History   Problem Relation Name Age of Onset   . Cancer Mother          Leukemia   . Cancer Brother          Lukemia       No  Known Allergies    Review of Systems - A 12-point revew of systems was completed and was negative except that which is stated in the HPI.       PHYSICAL EXAMINATION   Temperature: 98.3 F (36.8 C) (12/16 1002)  Blood pressure (BP): 138/77 (12/16 1002)  Heart Rate: 68 (12/16 1002)  General:  This is a well-developed, well-nourished, very pleasant female in no acute distress.   Psych: Normal mood and affect  Skin : No visible rashes or lesions  HEENT: Normocephalic, atraumatic, EOMI  Chest: non-labored, even respirations  Cor: Regular rate, rhythm  Abdomen:  Soft, nontender, nondistended, with no palpable masses or organomegaly.   No obvious hernias are present.    Neuro: non focal; normal sensation to light touch in the perineal and perianal regions and BLE   Pelvic examination:  External genitalia are normal except for an epidermal inclusion cyst on the right labia majora.  The urethra and meatus are normal.  The urethra is not hypermobile with stress and cough stress test  is negative.  The anus and perineum are normal.   Estrogen status is atrophic.  There is no prolapse and no pelvic floor muscle tenderness      Labs:   Urine dip today negative      Assessment:  7y F with urgency incontinence    Plan:   Today we discussed in detail the patient's symptoms and diagnosis. We discussed treatment options for OAB which include:    1. First-line therapy including fluid and behavioral modifications. We discussed bladder retraining as well as the option of doing pelvic floor physical therapy to enhance first line treatment.    2. Medical therapy which may include anticholinergic or beta 3 agonist medications, with the addition of local hormone replacement.    3. Third line therapy including posterior tibial nerve stimulation, sacral neuromodulation, and intra-detrusor injection of onabotulinumtoxinA.    I emphasized first line treatment and asked her to stop drinking  soda altogether which will likely improve her  symptoms. I also prescribed trospium. She will follow up in six weeks.    Dennie Bible, MD

## 2018-01-28 NOTE — Telephone Encounter (Signed)
Patient says the medication she ws prescribed today was denied by her insurance so she needs a new order for a different medication . Please call patient to update. Thank you.

## 2018-01-28 NOTE — Progress Notes (Signed)
Procedure Date: 01/28/2018    Pre Procedure Diagnosis:  Urgency urinary incontinence    Post Procedure Diagnosis:  Urgency urinary incontinence    Procedure Performed: Cystourethroscopy    Description of Procedure:  Ms. Sherald HessDisher was taken to the cystoscopy suite where her identity was confirmed and a time out was performed.  She was positioned in the supine frog leg position and prepped and draped in the standard sterile fashion.  Two percent lidocaine gel was passed into the urethra for local anesthesia.  The cystoscope was passed atraumatically and the bladder and the urethra were carefully examined.   No tumors, stones, or foreign bodies were identified. The bladder mucosa was normal appearing.  The bilateral ureteral orifices are visualized in their orthotopic position with clear efflux of urine.  The urethra was normal with no masses, tenderness, or strictures.   The patient tolerated the procedure well.

## 2018-01-29 NOTE — Telephone Encounter (Signed)
2nd message   Patient says the medication she ws prescribed today was denied by her insurance so she needs a new order for a different medication . Please call patient to update. Thank you.

## 2018-01-30 ENCOUNTER — Ambulatory Visit: Payer: Medicare Other | Admitting: Psychiatry

## 2018-01-30 ENCOUNTER — Other Ambulatory Visit: Payer: Self-pay

## 2018-01-30 MED ORDER — OXYBUTYNIN CHLORIDE 10 MG OR TB24
10.0000 mg | ORAL_TABLET | Freq: Every day | ORAL | 2 refills | Status: DC
Start: 2018-01-30 — End: 2018-03-18

## 2018-01-30 NOTE — Telephone Encounter (Signed)
Fwd msg to MD to send alt med

## 2018-01-30 NOTE — Telephone Encounter (Signed)
Patient's husband is requesting a call back from nurse to check update on patient's medication.     [id number] - [place], [place] - [address] [facility] [phone]

## 2018-01-30 NOTE — Telephone Encounter (Signed)
LVM for pt Per MD  "Sent oxybutynin to pharmacy as an alternative. Please let him know. "

## 2018-01-31 MED ORDER — TRIFLUOPERAZINE HCL 5 MG OR TABS
2.5000 mg | ORAL_TABLET | Freq: Every evening | ORAL | 1 refills | Status: DC
Start: 2018-01-31 — End: 2018-02-07

## 2018-02-07 ENCOUNTER — Ambulatory Visit (INDEPENDENT_AMBULATORY_CARE_PROVIDER_SITE_OTHER): Payer: Medicare Other | Admitting: Psychiatry

## 2018-02-07 ENCOUNTER — Other Ambulatory Visit: Payer: Self-pay | Admitting: Psychiatry

## 2018-02-07 DIAGNOSIS — G259 Extrapyramidal and movement disorder, unspecified: Secondary | ICD-10-CM

## 2018-02-07 DIAGNOSIS — F419 Anxiety disorder, unspecified: Secondary | ICD-10-CM

## 2018-02-07 DIAGNOSIS — F250 Schizoaffective disorder, bipolar type: Secondary | ICD-10-CM

## 2018-02-07 MED ORDER — SERTRALINE HCL 50 MG OR TABS
50.0000 mg | ORAL_TABLET | Freq: Every day | ORAL | 0 refills | Status: DC
Start: 2018-02-07 — End: 2018-02-07

## 2018-02-07 MED ORDER — TRIHEXYPHENIDYL HCL 2 MG OR TABS
2.0000 mg | ORAL_TABLET | Freq: Every day | ORAL | 0 refills | Status: DC
Start: 2018-02-07 — End: 2018-02-07

## 2018-02-07 MED ORDER — SERTRALINE HCL 100 MG OR TABS
100.0000 mg | ORAL_TABLET | Freq: Every day | ORAL | 0 refills | Status: DC
Start: 2018-02-07 — End: 2018-03-12

## 2018-02-07 MED ORDER — FLUPHENAZINE HCL 2.5 MG OR TABS
2.5000 mg | ORAL_TABLET | Freq: Two times a day (BID) | ORAL | 1 refills | Status: DC
Start: 2018-02-07 — End: 2018-04-03

## 2018-02-07 MED ORDER — TRIHEXYPHENIDYL HCL 1 MG OR (HALF TABLET)
1.0000 mg | ORAL_TABLET | Freq: Two times a day (BID) | ORAL | 1 refills | Status: DC
Start: 2018-02-07 — End: 2018-03-12

## 2018-02-07 NOTE — Patient Instructions (Addendum)
--   Increase to sertraline 100 mg PO qDaily  -- Increase to trihexyphenidly 2 mg PO qDaily

## 2018-02-07 NOTE — Progress Notes (Signed)
Outpatient Psychiatry Progress Note      Chief Complaint: Regularly scheduled follow-up appointment      Identifying Information:  Julie Monroe is a 72 year old female last seen on 01/02/2018 who presents today for a follow-up appointment.     Interval History:  Julie Monroe is a 72 year old F with a past psychiatric history of schizoaffective d/o, bipolar type, OCD and unspecified insomnia who presents for follow-up, accompanied by pt husband. Pt reports mood, "Not good", though denies suicidal ideation, intent and plan. Pt reports mood significantly, intervally worsened, with associated depressive symptoms incl decreased motivation, decreased energy, difficulty sleeping and hopelessness. Pt reports circadian cycle, reporting feeling "OK" in the mornings and progressively worse throughout the day. Pt reports decreased motivation have resulted in not exercising, despite having a free gym membership and enjoying reclined cycling (due to back pain). Pt reports worsening mood corresponds with discontinuing sertraline. Pt reports compliance with medications as directed (except for self-uptitration to fluphenazine 2.5 mg PO BID in response to "psychotic symptoms"), and denies AEs. Gross neurological exam notable for involuntary perioral and facial twitches and bilateral upper extremity tremor. Pt reports past trials of "all antidepressants" incl bupropion, though cannot recall response and tolerability wrt bupropion specifically. Pt reports being administered quetiapine "for years" and reports positive response wrt psychotic symptoms following stating quetiapine and fluphenazine. Pt reports unknown response to aripiprazole and ziprasidone. Discussed risks, benefits and alternate treatments wrt to current psychotropic regimen, which pt appears to understand. Pt reports concern for weight gain and sedation. Discussed risks, benefits and alternate treatments wrt to clozapine, and pt reports preference for not  starting at this time.    Review of Systems:  Denies fever, chills, chest pain, dyspnea, headaches, nausea, and light-headedness.    Current Outpatient Medications:    Current Outpatient Medications:   .  ARIPiprazole (ABILIFY) 30 MG tablet, Take 1 tablet (30 mg) by mouth daily., Disp: 90 tablet, Rfl: 2  .  Clobetasol Propionate (IMPOYZ) 0.025 % CREA, Apply topically., Disp: , Rfl:   .  desvenlafaxine (PRISTIQ) 50 MG TB24, Take 1 tablet (50 mg) by mouth daily., Disp: 90 tablet, Rfl: 2  .  fluphenazine (PROLIXIN) 2.5 MG tablet, Take 1 tablet (2.5 mg) by mouth 2 times daily., Disp: 60 tablet, Rfl: 1  .  gabapentin (NEURONTIN) 600 MG tablet, Take 2 tablets (1,200 mg) by mouth at bedtime., Disp: 180 tablet, Rfl: 1  .  levothyroxine (SYNTHROID) 112 MCG tablet, Take 112 mcg by mouth daily., Disp: , Rfl: 0  .  oxybutynin (DITROPAN XL) 10 MG Controlled-Release tablet, Take 1 tablet (10 mg) by mouth daily., Disp: 30 tablet, Rfl: 2  .  quetiapine (SEROQUEL) 400 MG tablet, TAKE 1 TABLET(400 MG) BY MOUTH EVERY NIGHT, Disp: 90 tablet, Rfl: 2  .  sertraline (ZOLOFT) 50 MG tablet, Take 1 tablet (50 mg) by mouth daily., Disp: 30 tablet, Rfl: 0  .  trihexyphenidyl (ARTANE) 1 MG tablet, Take 1 each (1 mg) by mouth 2 times daily., Disp: 60 tablet, Rfl: 1  .  vitamin D3 (VITAMIN D3) 1000 units capsule, Take 1,000 Units by mouth daily., Disp: , Rfl:   .  ziprasidone (GEODON) 40 MG capsule, Take 2 capsules (80 mg) by mouth 2 times daily (with meals)., Disp: 60 capsule, Rfl: 2  .  zonisamide (ZONEGRAN) 100 MG capsule, Take 2 capsules (200 mg) by mouth daily., Disp: 180 capsule, Rfl: 1    Vital Signs:  There were no vitals taken  for this visit.    Mental Status Exam:    Appearance: As stated age, good grooming/hygiene, wearing appropriate clothing  Behavior: Cooperative. Fair eye contact. Calm.  Gait: Steady gait  Muscle Strength/Tone: Regular, involuntary perioral and facial twitching and bilateral upper extremity tremor  Speech: Normal  rate, tone and volume  Mood: "Not good"  Affect: Mildly constricted  Thought Process: Linear, goal-directed         - Associations: Intact  Thought Content: No suicidal or homicidal ideation. Not responding to internal stimuli. No auditory or visual hallucinations.  Attention Span and Concentration: Not distracted in conversation  Language: Able to name objects  Fund of Knowledge: Average  Memory: Both recent and remote memories are intact in context of conversation  Orientation: Intact to person, place  Insight/Judgment: Fair    AIMS (02/07/18):  Facial & Oral Movements: 6  Extremity movements: 3  Trunk Movement: 0  Global Judgment: 1  Total: 10    LABS/ OTHER STUDIES    CHEM 7  No results found for: NA, K, CL, CO2, BUN, CR, GLU, [place], MG  LFT's  No results found for: AST, ALT, ALP, ALB, TP, TBIL  CBC  No results found for: WBC, HGB, HCT, PLT  LIPID PANEL  No results found for: CHOL, LDLD, HDL, TRIG  HGBA1c  No components found for: A1C;3  Miscellaneous   No results found for: TSH, FT4     Scales:   No flowsheet data found.  No flowsheet data found.      Assessment:   Julie Monroe is a 72 year old F with a past psychiatric history of schizoaffective d/o, bipolar type, OCD and unspecified insomnia who presents for follow-up, accompanied by pt husband. Pt demonstrates chronic, stable psychotic delusion, with some evidence of cognitive decline, thought process disorganization and affective blunting. Pt reports worsening mood and associated depressive symptoms, however. Plan to uptitrate sertraline as tolerated, as pt reports previous, long-term mood stability on dual antidepressants. Pt currently demonstrates involuntary perioral and facial twitching and bilateral upper extremity tremor. Ddx incl TD and EPS. Plan to trial trihexyphenidyl uptitration (as tolerated), as empiric treatment for EPS (as pt recently self-uptitrated fluphenazine). Plan to consider considering quetiapine at future encounters, as pt expresses  specific concern for weight gain and sedation.    She is assessed to be at alow acute risk of suicide.   Clinical Global Impression - Severity:4= Moderately ill  :Suggested Guidelines= Overt symptomatology causing noticeable, but modest, functional impairment.    Diagnosis/Diagnoses:     ICD-10-CM ICD-9-CM   1. Schizoaffective disorder, bipolar type (CMS-HCC) F25.0 295.70   2. 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   Continue fluphenazine 2.5 mg PO BID  ? Note, pt recently self-uptitrated   Continue aripiprazole 30 mg PO qDaily   Continue ziprasidone 80 mg PO qDaily   Continue quetiapine 400 mg PO qDaily  ? Consider downtitrating at future encounters    Increase to trihedyphenidyl(Artane) 1 mg PO BID for EPS, from 1 mg PO qDaily   Continue gabapentin 1200 mg PO QHS for insomnia   Continue desvenlafaxine 50 mg qDaily   Increase tosertraline 100 mg qDaily, from 50 mg  PO qDaily   ? Note: Depression worsened after stopping medication    In-session psychotherapy:  In-session psychotherapy provided for 39 minutes  at this encounter  Progress to date: Improving  -- Psychoeducation regarding current regimen, and possible future plans to reduce polypharmacy to meet pt therapeutic goals to reduce weight gain and sedation  -- F/u SMART goals     -- Walk of any length at least one per week     -- Go to gym of any length at least once per week  -- Extensive psychoeducation re clozapine and cognitive effects of anticholinergics discussed  Treatment plan: Continue psychoeducation and supportive    Note Author:  Elayne GuerinAndrew Chunkil Tiphany Fayson, MD  Resident/Fellow Physician

## 2018-02-11 NOTE — Progress Notes (Signed)
Attending Attestation:  I was present with the trainee during the the key portions of the exam.  I participated in the medical decision making and management of the patient. I agree with the findings and plan as documented.  I provided supervision for supportive and other psychotherapy modalities employed. My additions or revisions are included in the record as needed.     Cammeron Greis, MD  Attending Physician

## 2018-02-20 ENCOUNTER — Ambulatory Visit (INDEPENDENT_AMBULATORY_CARE_PROVIDER_SITE_OTHER): Payer: Medicare Other | Admitting: Psychiatry

## 2018-02-20 DIAGNOSIS — G4700 Insomnia, unspecified: Secondary | ICD-10-CM

## 2018-02-20 DIAGNOSIS — F250 Schizoaffective disorder, bipolar type: Secondary | ICD-10-CM

## 2018-02-20 DIAGNOSIS — F419 Anxiety disorder, unspecified: Secondary | ICD-10-CM

## 2018-02-20 DIAGNOSIS — G259 Extrapyramidal and movement disorder, unspecified: Secondary | ICD-10-CM

## 2018-02-20 MED ORDER — QUETIAPINE FUMARATE 300 MG OR TABS
300.0000 mg | ORAL_TABLET | Freq: Every evening | ORAL | 0 refills | Status: DC
Start: 2018-02-20 — End: 2018-03-12

## 2018-02-20 NOTE — Patient Instructions (Signed)
--   Decrease to quetiapine 300 mg PO QHS for insomnia     Please have Julie Monroe notify Dr. Willaim Bane regarding sleep in 1 week  -- Increase to Artane 1 mg PO twice daily    -- Referral made to [facility] in [facility] area      If not contacted within 1 week, please notify Dr. Willaim Bane

## 2018-02-20 NOTE — Progress Notes (Signed)
Outpatient Psychiatry Progress Note      Chief Complaint: Regularly scheduled follow-up appointment      Identifying Information:  Julie Monroe is a 73 year old female last seen on 02/07/2018 who presents today for a follow-up appointment.     Interval History:  Julie Monroe a72 year oldF with a past psychiatric history of schizoaffective d/o, bipolar type, OCD and unspecified insomnia who presents for follow-up. Pt reports mood, "Better", and denies suicidal ideation, intent and plan. Pt reports "getting through the holidays... Maybe even feeling a little better" wrt mood and anxiety. Pt reports subjective, interval improvement in perioral and upper extremity tremor. Pt reports compliance with medications as directed, without noticeable AEs. Note, however, that pt reports administering trihexyphenidyl 1 mg PO QHS since last encounter. Pt reports improved auditory hallucinations, since self-uptitrating fluphenazine (prior to last encounter), though continues to endorse belief that "I have a stalker". Discussed risks, benefits and alternate treatments wrt quetiapine for insomnia, and pt is amenable to trial'ing downtitration of quetiapine at this time. Pt reports starting quetiapine for insomnia per outside provider > 25 ya, and starting gabapentin for insomnia subsequent to that.     Review of Systems:  Denies fever, chills, chest pain, dyspnea, headaches, nausea, and light-headedness.    Current Outpatient Medications:    Current Outpatient Medications:   .  ARIPiprazole (ABILIFY) 30 MG tablet, Take 1 tablet (30 mg) by mouth daily., Disp: 90 tablet, Rfl: 2  .  Clobetasol Propionate (IMPOYZ) 0.025 % CREA, Apply topically., Disp: , Rfl:   .  desvenlafaxine (PRISTIQ) 50 MG TB24, Take 1 tablet (50 mg) by mouth daily., Disp: 90 tablet, Rfl: 2  .  fluphenazine (PROLIXIN) 2.5 MG tablet, Take 1 tablet (2.5 mg) by mouth 2 times daily., Disp: 60 tablet, Rfl: 1  .  gabapentin (NEURONTIN) 600 MG tablet, Take 2  tablets (1,200 mg) by mouth at bedtime., Disp: 180 tablet, Rfl: 1  .  levothyroxine (SYNTHROID) 112 MCG tablet, Take 112 mcg by mouth daily., Disp: , Rfl: 0  .  oxybutynin (DITROPAN XL) 10 MG Controlled-Release tablet, Take 1 tablet (10 mg) by mouth daily., Disp: 30 tablet, Rfl: 2  .  QUEtiapine (SEROQUEL) 300 MG tablet, Take 1 tablet (300 mg) by mouth nightly., Disp: 30 tablet, Rfl: 0  .  sertraline (ZOLOFT) 100 MG tablet, Take 1 tablet (100 mg) by mouth daily., Disp: 30 tablet, Rfl: 0  .  trihexyphenidyl (ARTANE) 1 MG tablet, Take 1 each (1 mg) by mouth 2 times daily., Disp: 60 tablet, Rfl: 1  .  vitamin D3 (VITAMIN D3) 1000 units capsule, Take 1,000 Units by mouth daily., Disp: , Rfl:   .  ziprasidone (GEODON) 40 MG capsule, Take 2 capsules (80 mg) by mouth 2 times daily (with meals)., Disp: 60 capsule, Rfl: 2  .  zonisamide (ZONEGRAN) 100 MG capsule, Take 2 capsules (200 mg) by mouth daily., Disp: 180 capsule, Rfl: 1    Vital Signs:  There were no vitals taken for this visit.    Appearance: As stated age, good grooming/hygiene, wearing appropriate clothing  Behavior: Cooperative. Fair eye contact. Calm.  Gait: Steady gait  Muscle Strength/Tone: Resolved perioral and facial twitching and intervally improved bilateral upper extremity tremor  Speech: Normal rate, tone and volume  Mood: "Better"  Affect: Mildly constricted  Thought Process: Linear, goal-directed         - Associations: Intact  Thought Content: No suicidal or homicidal ideation. Not responding to internal stimuli. No  auditory or visual hallucinations.  Attention Span and Concentration: Not distracted in conversation  Language: Able to name objects  Fund of Knowledge: Average  Memory: Both recent and remote memories are intact in context of conversation  Orientation: Intact to person, place  Insight/Judgment: Improved    AIMS (02/07/18):  Facial & Oral Movements: 6  Extremity movements: 3  Trunk Movement: 0  Global Judgment: 1  Total: 10    AIMS  (02/20/17):  Facial & Oral Movements: 0  Extremity movements: 2  Trunk Movement: 0  Global Judgment: 1  Total: 3    LABS/ OTHER STUDIES    CHEM 7  No results found for: NA, K, CL, CO2, BUN, CR, GLU, [place], MG  LFT's  No results found for: AST, ALT, ALP, ALB, TP, TBIL  CBC  No results found for: WBC, HGB, HCT, PLT  LIPID PANEL  No results found for: CHOL, LDLD, HDL, TRIG  HGBA1c  No components found for: A1C;3  Miscellaneous   No results found for: TSH, FT4     Scales:   No flowsheet data found.  No flowsheet data found.      Assessment:   Julie Monroe a72 year oldF with a past psychiatric history of schizoaffective d/o, bipolar type, OCD and unspecified insomnia who presents for follow-up.Pt demonstrates chronic, stable psychotic delusion, with some evidence of cognitive decline, thought process disorganization and affective blunting. Pt reports intervally improved mood and associated depressive symptoms, however, since last encounter. Plan to maintain sertraline at current dosage, as pt reports previous, long-term mood stability on dual antidepressants. Pt demonstrates intervally improved involuntary perioral and facial twitching and bilateral upper extremity tremor on trihexyphenidyl, which support diagnosis of EPS. Plan to downtitrate quetiapine for insomnia, as pt expresses specific concern for weight gain and sedation.    She is assessed to be at a low acute risk of suicide.   Clinical Global Impression - Severity: 4= Moderately ill  :Suggested Guidelines= Overt symptomatology causing noticeable, but modest, functional impairment. .    Diagnosis/Diagnoses:     ICD-10-CM ICD-9-CM   1. Schizoaffective disorder, bipolar type (CMS-HCC) F25.0 295.70   2. 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 Anxiety F41.9 300.00       Plan:  Medication Management:Risks, benefits, and alternatives of medications were discussed today with Julie Monroe and she consents  to regimen.   Continue fluphenazine 2.5 mgPOBID  ? Note, pt recently self-uptitrated   Continue aripiprazole 30 mgPOqDaily   Continue ziprasidone 80 mgPOqDaily   Decrease to quetiapine 300 mg PO QHS, from 400 mg PO QHS   Increase to trihedyphenidyl(Artane)1 mg POBID for EPS, from 1 mg PO qDaily  ? Note, pt reports not uptitrating since last encounter   Continue gabapentin 1200 mgPO QHS forinsomnia   Continue desvenlafaxine 50 mg qDaily   Continuesertraline 100 mg qDaily  ? Note: Depression worsened after recently stopping medication    In-session psychotherapy:  In-session psychotherapy provided for 21 minutes at this encounter  Progress to date: Improving  -- Psychoeducation regarding current regimen, esp sleep aids, and AEs of antipsychotics incl weight gain  -- Psychoeducation regarding current regimen, and possible future plans to reduce polypharmacy to meet pt therapeutic goals to reduce weight gain and sedation  -- F/u SMART goals     -- Walk of any length at least one per week     -- Go to gym of any length at least once  per week  -- Extensive psychoeducation re clozapine and cognitive effects of anticholinergics discussed  Treatment plan: Continue psychoeducation and supportive    Note Author:  Elayne Guerin, MD  Resident/Fellow Physician

## 2018-02-25 ENCOUNTER — Telehealth: Payer: Self-pay | Admitting: Psychiatry

## 2018-02-25 DIAGNOSIS — Z0000 Encounter for general adult medical examination without abnormal findings: Secondary | ICD-10-CM

## 2018-02-25 NOTE — Telephone Encounter (Addendum)
Order placed for referral to [facility] PCP new patient evaluation, upon pt request.          Pt unable to leave voicemail. No answer at your extension. Pt asking for a call back regarding referral you gave, rx refill and health status update.

## 2018-02-26 ENCOUNTER — Telehealth: Payer: Self-pay | Admitting: Urology

## 2018-02-26 ENCOUNTER — Telehealth: Payer: Self-pay | Admitting: Internal Medicine

## 2018-02-26 NOTE — Telephone Encounter (Signed)
Patient requesting a call back from office concerning questions on pills she is currently taking. She needs to know how many pills she needs to take prior to coming to appointment. Thank you

## 2018-02-26 NOTE — Telephone Encounter (Signed)
PATIENT IS CALLING STATED THAT SHE RECEIVED A CALL A EMAIL AND IT TOLD HER TO SEE MYCHART BY SHE CAN ACCESS MYCHART. FOR AN APPOINTMENT ON WAITLIST. PLEASE CALL PATIENT WOULD LIKE SOONER APPOINTMENT. PLEASE CALL PATIENT THANK YOU

## 2018-02-27 ENCOUNTER — Encounter: Payer: Self-pay | Admitting: Psychiatry

## 2018-02-27 NOTE — Telephone Encounter (Signed)
Tori, Please let her know we'll add her to Dr. Orland Dec wait list and contact her should something sooner open up. Thank you.

## 2018-02-28 NOTE — Telephone Encounter (Signed)
LVM to stating per instructions to continue taking 1 tablet daily until follow up appointment

## 2018-03-01 NOTE — Telephone Encounter (Signed)
Lvm msg informing patient of below info.

## 2018-03-11 ENCOUNTER — Encounter: Payer: Self-pay | Admitting: Urology

## 2018-03-11 ENCOUNTER — Ambulatory Visit: Payer: Medicare Other | Admitting: Urology

## 2018-03-11 ENCOUNTER — Telehealth: Payer: Self-pay | Admitting: Psychiatry

## 2018-03-11 DIAGNOSIS — F419 Anxiety disorder, unspecified: Secondary | ICD-10-CM

## 2018-03-11 DIAGNOSIS — G4700 Insomnia, unspecified: Secondary | ICD-10-CM

## 2018-03-11 DIAGNOSIS — G259 Extrapyramidal and movement disorder, unspecified: Secondary | ICD-10-CM

## 2018-03-11 DIAGNOSIS — F250 Schizoaffective disorder, bipolar type: Secondary | ICD-10-CM

## 2018-03-11 DIAGNOSIS — F429 Obsessive-compulsive disorder, unspecified: Secondary | ICD-10-CM

## 2018-03-11 NOTE — Telephone Encounter (Signed)
ZOLOFT PRISTIQ AND ARTANE REFILL REQUEST

## 2018-03-11 NOTE — Telephone Encounter (Signed)
Spoke with patient and offered her 1440 opening with Dr. [name] today and she declined. Very appreciative and wants to remain on wait list.

## 2018-03-11 NOTE — Progress Notes (Signed)
Attending Attestation:  I was present with the trainee during the the key portions of the exam.  I participated in the medical decision making and management of the patient. I agree with the findings and plan as documented.  I provided supervision for supportive and other psychotherapy modalities employed. My additions or revisions are included in the record as needed.     Issiac Jamar, MD  Attending Physician

## 2018-03-11 NOTE — Addendum Note (Signed)
Addended byAsa Lente, [place] Leinen on: 03/11/2018 10:52 AM     Modules accepted: Level of Service

## 2018-03-11 NOTE — Telephone Encounter (Signed)
Error

## 2018-03-12 MED ORDER — QUETIAPINE FUMARATE 300 MG OR TABS
300.0000 mg | ORAL_TABLET | Freq: Every evening | ORAL | 3 refills | Status: DC
Start: 2018-03-12 — End: 2018-05-16

## 2018-03-12 MED ORDER — DESVENLAFAXINE SUCCINATE 50 MG OR TB24
50.0000 mg | ORAL_TABLET | Freq: Every day | ORAL | 3 refills | Status: DC
Start: 2018-03-12 — End: 2019-03-18

## 2018-03-12 MED ORDER — TRIHEXYPHENIDYL HCL 1 MG OR (HALF TABLET)
1.0000 mg | ORAL_TABLET | Freq: Two times a day (BID) | ORAL | 3 refills | Status: DC
Start: 2018-03-12 — End: 2018-10-10

## 2018-03-12 MED ORDER — SERTRALINE HCL 100 MG OR TABS
100.0000 mg | ORAL_TABLET | Freq: Every day | ORAL | 3 refills | Status: DC
Start: 2018-03-12 — End: 2018-05-06

## 2018-03-12 MED ORDER — GABAPENTIN 600 MG OR TABS
1200.0000 mg | ORAL_TABLET | Freq: Every evening | ORAL | 3 refills | Status: DC
Start: 2018-03-12 — End: 2018-05-06

## 2018-03-12 NOTE — Telephone Encounter (Signed)
Refills ordered per request.

## 2018-03-12 NOTE — Telephone Encounter (Signed)
Spoke with patient 03/11/2018 and offered 1440 and she declined.

## 2018-03-12 NOTE — Telephone Encounter (Signed)
Spoke with patient and offered her tomorrow, 03/13/2018, with Dr. [name] at 1000 and she accepted. Very appreciative. Appt rescheduled.

## 2018-03-13 ENCOUNTER — Other Ambulatory Visit: Payer: Self-pay | Admitting: Psychiatry

## 2018-03-13 ENCOUNTER — Encounter: Payer: Self-pay | Admitting: Internal Medicine

## 2018-03-13 ENCOUNTER — Ambulatory Visit: Payer: Medicare Other | Attending: Internal Medicine | Admitting: Internal Medicine

## 2018-03-13 VITALS — BP 138/65 | HR 71 | Temp 98.0°F | Resp 16 | Ht 66.0 in | Wt 274.1 lb

## 2018-03-13 DIAGNOSIS — M545 Low back pain: Secondary | ICD-10-CM | POA: Insufficient documentation

## 2018-03-13 DIAGNOSIS — H539 Unspecified visual disturbance: Secondary | ICD-10-CM | POA: Insufficient documentation

## 2018-03-13 DIAGNOSIS — M8580 Other specified disorders of bone density and structure, unspecified site: Principal | ICD-10-CM | POA: Insufficient documentation

## 2018-03-13 DIAGNOSIS — R7303 Prediabetes: Secondary | ICD-10-CM | POA: Insufficient documentation

## 2018-03-13 DIAGNOSIS — F250 Schizoaffective disorder, bipolar type: Secondary | ICD-10-CM | POA: Insufficient documentation

## 2018-03-13 DIAGNOSIS — F429 Obsessive-compulsive disorder, unspecified: Secondary | ICD-10-CM | POA: Insufficient documentation

## 2018-03-13 DIAGNOSIS — R799 Abnormal finding of blood chemistry, unspecified: Secondary | ICD-10-CM | POA: Insufficient documentation

## 2018-03-13 DIAGNOSIS — R011 Cardiac murmur, unspecified: Secondary | ICD-10-CM | POA: Insufficient documentation

## 2018-03-13 DIAGNOSIS — G259 Extrapyramidal and movement disorder, unspecified: Secondary | ICD-10-CM

## 2018-03-13 DIAGNOSIS — E039 Hypothyroidism, unspecified: Secondary | ICD-10-CM | POA: Insufficient documentation

## 2018-03-13 DIAGNOSIS — G4700 Insomnia, unspecified: Secondary | ICD-10-CM | POA: Insufficient documentation

## 2018-03-13 DIAGNOSIS — M85872 Other specified disorders of bone density and structure, left ankle and foot: Secondary | ICD-10-CM | POA: Insufficient documentation

## 2018-03-13 DIAGNOSIS — G8929 Other chronic pain: Secondary | ICD-10-CM | POA: Insufficient documentation

## 2018-03-13 DIAGNOSIS — R9389 Abnormal findings on diagnostic imaging of other specified body structures: Secondary | ICD-10-CM | POA: Insufficient documentation

## 2018-03-13 MED ORDER — VITAMIN D3 2000 UNIT OR CAPS
1.0000 | ORAL_CAPSULE | Freq: Every day | ORAL | Status: DC
Start: ? — End: 2020-01-29

## 2018-03-13 MED ORDER — RED YEAST RICE PO
1.0000 | Freq: Every day | ORAL | Status: DC
Start: ? — End: 2018-10-10

## 2018-03-13 MED ORDER — TRIHEXYPHENIDYL HCL 2 MG OR TABS
ORAL_TABLET | ORAL | 2 refills | Status: DC
Start: 2018-03-13 — End: 2018-09-17

## 2018-03-13 MED ORDER — BIOTIN PO
1.0000 | Freq: Every day | ORAL | Status: DC
Start: ? — End: 2019-09-16

## 2018-03-13 MED ORDER — CALCIUM 1200 PO
1.0000 | Freq: Every day | ORAL | Status: DC
Start: ? — End: 2019-09-16

## 2018-03-13 MED ORDER — PERFLUTREN LIPID MICROSPHERE 1.1 MG/ML IV SUSP
0.4000 mL | Freq: Once | INTRAVENOUS | Status: AC
Start: 2018-03-13 — End: 2018-03-14

## 2018-03-13 MED ORDER — METAMUCIL FIBER PO
1.0000 | Freq: Every day | ORAL | Status: DC
Start: ? — End: 2018-10-10

## 2018-03-13 MED ORDER — OMEGA-3 FISH OIL 1000 MG PO CAPS
1.0000 g | ORAL_CAPSULE | Freq: Every day | ORAL | Status: DC
Start: ? — End: 2018-10-10

## 2018-03-13 MED ORDER — SODIUM CHLORIDE FLUSH 0.9 % IV SOLN
10.0000 mL | Freq: Once | INTRAVENOUS | Status: AC
Start: 2018-03-13 — End: 2018-03-14

## 2018-03-13 MED ORDER — ASPIRIN 81 MG OR TABS
81.0000 mg | ORAL_TABLET | Freq: Every day | ORAL | Status: DC
Start: ? — End: 2019-09-16

## 2018-03-13 NOTE — Progress Notes (Signed)
ESTABLISH CARE VISIT NOTE    Date of Visit: March 14, 2018    Chief Complaint: Establish Care      History of Present Illness: Julie Monroe is a 73 year old female here for Establish Care    72yof who is here to establish care, last seen by previous PCP (Dr[facility] Cecilio [facility]ilan) [facility]      # Schizoaffective d/o - diagnosed at age 73, also bipolar, OCD and insomnia, seeing Dr. Kandyce RudPereda and Dr. Willaim BanePark. On various medications, feels like her symptoms are overall doing better, and in the process of lowering her Seroquel dose further.  Next sees psych in mid-February. Gets therapy through Dr. Willaim BanePark, and Dr. Kandyce RudPereda manages medications.    Current regimen includes:   Continue fluphenazine 2.5 mgPOBID   Continue aripiprazole 30 mgPOqDaily   Continue ziprasidone 80 mgPOqDaily   Decrease to quetiapine 300 mg PO QHS, from 400 mg PO QHS   Increase totrihedyphenidyl(Artane)1 mg POBIDfor EPS, from 1 mg PO qDaily  ? Note, pt reports not uptitrating since last encounter   Continue gabapentin 1200 mgPO QHS forinsomnia   Continue desvenlafaxine 50 mgqDaily   Continuesertraline 100 mg qDaily  ? Note: Depression worsened after recently stopping medication    # OAB - for over 1-2 yrs, seeing Dr. Cecilie Lowersena Moskowitz and started on Oxybutynin ER 10mg  QAM; has taken med for one month and has not noticed much of a change.  Has an appt with her to f/u in 1 month.    # Lump on left foot - does not feel it today but previously noticed it for 1-2 months, has never been been painful, not changing in size until this morning -- has not noticed it.    # Left foot callus - has not seen a podiatrist yet.  No infection.    # Osteopenia - last bone density was > 2 yrs. Takes Calcium 1,200mg  and Vit D 2,000 international units per day.    # Left hand palmar eczema - mostly itchy, has tried clobetasol which helps with sxs when they occur.    # Vision changes - nearly every morning wakes up and has completed  blindness in both eyes for 5 minutes, and after that vision returns and has blurred vision.  Has seen an ophthalmologist in the past year or two.     # Hx of Bilat TKA     # Chronic back pain - has been present for some time.  Cannot stand for prolonged periods of time, has to take very frequent rest.  MRI was done one year ago with outside provider.  No numbness or tingling of LE or weakness.  Has urge incontinence and some leakage. Has not had PT, does "not believe" in her back.  Sxs have not changed in the last one year.     # Hypothyroidism - on Synthroid 125mcg, sees endo yearly for this, next appt in 07/2018.    Healthcare Maintenance:  Last colonoscopy one week ago - 2 polyps, recall 5 years  Last pap smear - sees outside GYN, will see her in 2 yrs, last was 2 yrs ago  Last mammogram - 1 yr ago, was normal      Past Medical History:  Patient Active Problem List   Diagnosis   . Schizoaffective disorder, bipolar type (CMS-HCC)   . Insomnia   . Morbid obesity (CMS-HCC)   . Tremor   . Osteoarthrosis   . Hypothyroidism   .  Obsessive-compulsive disorder, unspecified type   . Anxiety   . Extrapyramidal and movement disorder       Past Surgical History:  Past Surgical History:   Procedure Laterality Date   . Biilat. Knee replacement     . Rt toe surgery     . TOTAL KNEE ARTHROPLASTY Bilateral        Social Health:  Social History     Socioeconomic History   . Marital status: Married     Spouse name: Not on file   . Number of children: Not on file   . Years of education: Not on file   . Highest education level: Not on file   Occupational History   . Not on file   Social Needs   . Financial resource strain: Not on file   . Food insecurity:     Worry: Not on file     Inability: Not on file   . Transportation needs:     Medical: Not on file     Non-medical: Not on file   Tobacco Use   . Smoking status: Former Smoker     Packs/day: 2.00     Years: 30.00     Pack years: 60.00   . Smokeless tobacco: Never Used   Substance  and Sexual Activity   . Alcohol use: No     Frequency: Never   . Drug use: No   . Sexual activity: Never   Lifestyle   . Physical activity:     Days per week: Not on file     Minutes per session: Not on file   . Stress: Not on file   Relationships   . Social connections:     Talks on phone: Not on file     Gets together: Not on file     Attends religious service: Not on file     Active member of club or organization: Not on file     Attends meetings of clubs or organizations: Not on file     Relationship status: Not on file   . Intimate partner violence:     Fear of current or ex partner: Not on file     Emotionally abused: Not on file     Physically abused: Not on file     Forced sexual activity: Not on file   Other Topics Concern   . Not on file   Social History Narrative   . Not on file       Family Health:  Family History   Problem Relation Age of Onset   . Other Mother         Wegener   . Cancer Brother         Leukemia   . Cancer Father         Leukemia   . Heart Disease Father    . Hypertension Father    . No Known Problems Sister    . Cancer Brother         Bladder Cancer       Allergies:   No Known Allergies      Current Meds:   Outpatient Medications Marked as Taking for the 03/13/18 encounter (Office Visit) with Leonia Reader, MD   Medication Sig Dispense Refill   . ARIPiprazole (ABILIFY) 30 MG tablet Take 1 tablet (30 mg) by mouth daily. 90 tablet 2   . aspirin 81 MG tablet Take 81 mg by mouth daily.     Marland Kitchen  BIOTIN PO Take 1 capsule by mouth daily.     . Calcium Carbonate-Vit D-Min (CALCIUM 1200 PO) Take 1 tablet by mouth daily.     . Cholecalciferol (VITAMIN D3) 50 MCG (2000 UT) capsule Take 1 capsule by mouth daily.     . Clobetasol Propionate (IMPOYZ) 0.025 % CREA Apply topically.     Marland Kitchen desvenlafaxine (PRISTIQ) 50 MG TB24 Take 1 tablet (50 mg) by mouth daily. 90 tablet 3   . fluphenazine (PROLIXIN) 2.5 MG tablet Take 1 tablet (2.5 mg) by mouth 2 times daily. 60 tablet 1   . gabapentin (NEURONTIN) 600 MG  tablet Take 2 tablets (1,200 mg) by mouth at bedtime. 180 tablet 3   . levothyroxine (SYNTHROID) 112 MCG tablet Take 112 mcg by mouth daily.  0   . omega-3 fatty acids, OTC, 1000 MG CAPS Take 1 g by mouth daily (with food).     Marland Kitchen oxybutynin (DITROPAN XL) 10 MG Controlled-Release tablet Take 1 tablet (10 mg) by mouth daily. 30 tablet 2   . Psyllium (METAMUCIL FIBER PO) Take 1 capsule by mouth daily.     . QUEtiapine (SEROQUEL) 300 MG tablet Take 1 tablet (300 mg) by mouth nightly. 30 tablet 3   . Red Yeast Rice Extract (RED YEAST RICE PO) Take 1 tablet by mouth daily.     . sertraline (ZOLOFT) 100 MG tablet Take 1 tablet (100 mg) by mouth daily. 30 tablet 3   . trihexyphenidyl (ARTANE) 1 MG tablet Take 1 each (1 mg) by mouth 2 times daily. 60 tablet 3   . trihexyphenidyl (ARTANE) 2 MG tablet TAKE 1/2 TABLET BY MOUTH TWICE DAILY 90 tablet 2   . vitamin D3 (VITAMIN D3) 1000 units capsule Take 1,000 Units by mouth daily.     . ziprasidone (GEODON) 40 MG capsule Take 2 capsules (80 mg) by mouth 2 times daily (with meals). 60 capsule 2   . zonisamide (ZONEGRAN) 100 MG capsule Take 2 capsules (200 mg) by mouth daily. 180 capsule 1     Current Facility-Administered Medications for the 03/13/18 encounter (Office Visit) with Leonia Reader, MD   Medication Dose Route Frequency Provider Last Rate Last Dose   . [EXPIRED] perflutren lipid microspheres (DEFINITY) injection 0.44 mg  0.4 mL IntraVENOUS Once Leonia Reader, MD       . [EXPIRED] sodium chloride 0.9 % flush 10 mL  10 mL IntraVENOUS Once Leonia Reader, MD           Review of Systems: 12-point ROS is negative except as stated above in the HPI    Vital Signs:  BP 138/65 (BP Location: Left arm, BP Patient Position: Sitting, BP cuff size: Regular)   Pulse 71   Temp 98 F (36.7 C) (Temporal)   Resp 16   Ht 5\' 6"  (1.676 m)   Wt 124.3 kg (274 lb 2.3 oz)   LMP  (LMP Unknown)   Breastfeeding No   BMI 44.25 kg/m     PHYSICAL EXAM:  General: Alert, cooperative, and  interactive  Head/Ears/Nose/Throat: normocephalic; nasal turbinates non-edematous, oropharynx clear  Eyes: pupils equally round and reactive to light.  Pulmonary: clear bilaterally; no wheezes, rales or rhonchi  Cardiovascular: regular rhythm; regular rate; normal S1,S2; 3/6 murmur best heard at RUSB  Abdominal: soft; obese; nondistended; nontender to palpation; normal bowel sounds  Extremities: 1+ bilat nonpitting edema in LE  Neurological: alert and oriented times 3  Psychiatric Details:  Affect normal; mood matches affect  Diagnostic Data:  No results found for: ALB, ALT, AST, BUN, Archer, CL, CHOL, CO2, CREAT, GFR, GFRAA, GFRNON, GLU, HDL, HGB, A1C, LDL, MG, PHOS, PLT, PLCTEL, K, PSA, NA, SODIUM, TRIG, WBC    Imaging Data:      Assessment and Plan:  1. Osteopenia, unspecified location  Will repeat dexa, encourage ongoing calcium 1200mg , Vit D 1000 international units and daily weight bearing exercising.  - X-RAY Dexa (Bone Density) Skeletal; Future  - CBC w/ Diff Lavender; Future  - Vitamin D, 25-OH Total Yellow serum separator tube; Future    2. Changes in vision  Unclear etiology of her sxs, so will refer her to Ophthalmology clinic asap.  - Consult/Referral to Ophthalmology Clinic    3. Hypothyroidism, unspecified type  Continue Synthroid daily, will check updated TFTs.    4. Schizoaffective disorder, bipolar type (CMS-HCC)  Per psych mgmt.  See meds above.  No HI/SI.  - TSH, Blood - See Instructions; Future  - Free Thyroxine, Blood - See Instructions; Future    5. Obsessive-compulsive disorder, unspecified type  Per psych mgmt.    6. Insomnia, unspecified type  Per psych mgmt.    7. Morbid obesity (CMS-HCC)  LM for now, will check updated labs, screen for metabolic syndrome.  - Comprehensive Metabolic Panel - See Instructions; Future  - Lipid Panel Green Plasma Separator Tube; Future  - Glycosylated Hgb(A1C), Blood Lavender; Future  - Random Urine Microalb/Creat Ratio Panel; Future    8. Chronic low  back pain, unspecified back pain laterality, unspecified whether sciatica present  Will start with XR L spine, no red flag s/s noted today.   - Vitamin D, 25-OH Total Yellow serum separator tube; Future  - X-Ray Lumbar Spine Complete Incl Bending; Future    9. Prediabetes   LM for now, will check updated labs, weight loss strongly advised.  - Free Thyroxine, Blood - See Instructions; Future    10. Undiagnosed cardiac murmurs  No recent cardiac workup, is asymptomatic.  Will check 2D echo.  - Complete 2D ECHO with Strain w [place] Image Enhancement Agent if Needed; Future  - perflutren lipid microspheres (DEFINITY) injection 0.44 mg  - sodium chloride 0.9 % flush 10 mL      > 60 minutes spent in direct patient care and coordination of care for this medically complex patient and > 50% of that time was spent in counseling for concerns as listed above, namely her psych history, joint pain, OAB, hypothyroidism, diagnostic screenings, and other problems as noted above.

## 2018-03-13 NOTE — Patient Instructions (Addendum)
Please follow up with a local podiatrist for your feet.    Please get your Shingrix (Shingles) vaccine through [facility].    Please see your gynecologist for your pap smear and mammogram.    Please get your bone density test and spine xray done prior to your next visit with me.  Please get your fasting blood tests done within the next 1-2 weeks.     Please get your heart ultrasound done prior to our next visit.     Please contact radiology at [phone] or [phone]

## 2018-03-14 ENCOUNTER — Encounter: Payer: Self-pay | Admitting: Internal Medicine

## 2018-03-18 ENCOUNTER — Ambulatory Visit (INDEPENDENT_AMBULATORY_CARE_PROVIDER_SITE_OTHER): Payer: Medicare Other | Admitting: Urology

## 2018-03-18 ENCOUNTER — Telehealth: Payer: Self-pay | Admitting: Urology

## 2018-03-18 ENCOUNTER — Ambulatory Visit: Payer: Medicare Other | Admitting: Urology

## 2018-03-18 ENCOUNTER — Encounter: Payer: Self-pay | Admitting: Urology

## 2018-03-18 VITALS — BP 132/73 | HR 72 | Temp 97.5°F | Ht 67.0 in | Wt 260.0 lb

## 2018-03-18 DIAGNOSIS — N3941 Urge incontinence: Principal | ICD-10-CM

## 2018-03-18 MED ORDER — SOLIFENACIN SUCCINATE 5 MG OR TABS
5.0000 mg | ORAL_TABLET | Freq: Every day | ORAL | 3 refills | Status: DC
Start: 2018-03-18 — End: 2018-03-20

## 2018-03-18 NOTE — Telephone Encounter (Signed)
Patient requesting a call back from office concerning procedure doctor offered to her. She states it is a shot in the inner ankle. She want to know what is name of procedure also  Procedure code and  diagnosis code to see if insurance will cover for it. Thank you

## 2018-03-18 NOTE — Progress Notes (Signed)
[facility]  [address]  [place], [place] [address]  [phone]      Urology Follow Up Note      HISTORY OF PRESENT ILLNESS:  Julie Monroe is a 73 year old-year-old woman here for follow up. She has a history of urgency urinary incontinence. Cystoscopy on 01/28/18 demonstrated no lesions. She has been treated with oxybutynin. She was also advised to stop drinking soda.    Today she reports no change in symptoms with medication. She drinks two large diet cokes in the morning plus 1-2 small bottles in the afternoon. She also drinks an iced coffee in the morning. She has little awareness of when she needs to void and has to put pads down on chairs for sitting.    Review of Systems - no fevers or dysuria      Current Outpatient Medications:   .  ARIPiprazole (ABILIFY) 30 MG tablet, Take 1 tablet (30 mg) by mouth daily., Disp: 90 tablet, Rfl: 2  .  aspirin 81 MG tablet, Take 81 mg by mouth daily., Disp: , Rfl:   .  BIOTIN PO, Take 1 capsule by mouth daily., Disp: , Rfl:   .  Calcium Carbonate-Vit D-Min (CALCIUM 1200 PO), Take 1 tablet by mouth daily., Disp: , Rfl:   .  Cholecalciferol (VITAMIN D3) 50 MCG (2000 UT) capsule, Take 1 capsule by mouth daily., Disp: , Rfl:   .  Clobetasol Propionate (IMPOYZ) 0.025 % CREA, Apply topically., Disp: , Rfl:   .  desvenlafaxine (PRISTIQ) 50 MG TB24, Take 1 tablet (50 mg) by mouth daily., Disp: 90 tablet, Rfl: 3  .  fluphenazine (PROLIXIN) 2.5 MG tablet, Take 1 tablet (2.5 mg) by mouth 2 times daily., Disp: 60 tablet, Rfl: 1  .  gabapentin (NEURONTIN) 600 MG tablet, Take 2 tablets (1,200 mg) by mouth at bedtime., Disp: 180 tablet, Rfl: 3  .  levothyroxine (SYNTHROID) 112 MCG tablet, Take 112 mcg by mouth daily., Disp: , Rfl: 0  .  omega-3 fatty acids, OTC, 1000 MG CAPS, Take 1 g by mouth daily (with food)., Disp: , Rfl:   .  Psyllium (METAMUCIL FIBER PO), Take 1 capsule by mouth daily., Disp: , Rfl:   .  QUEtiapine (SEROQUEL) 300 MG tablet, Take 1 tablet (300  mg) by mouth nightly., Disp: 30 tablet, Rfl: 3  .  Red Yeast Rice Extract (RED YEAST RICE PO), Take 1 tablet by mouth daily., Disp: , Rfl:   .  sertraline (ZOLOFT) 100 MG tablet, Take 1 tablet (100 mg) by mouth daily., Disp: 30 tablet, Rfl: 3  .  solifenacin (VESICARE) 5 MG tablet, Take 1 tablet (5 mg) by mouth daily., Disp: 30 tablet, Rfl: 3  .  trihexyphenidyl (ARTANE) 1 MG tablet, Take 1 each (1 mg) by mouth 2 times daily., Disp: 60 tablet, Rfl: 3  .  trihexyphenidyl (ARTANE) 2 MG tablet, TAKE 1/2 TABLET BY MOUTH TWICE DAILY, Disp: 90 tablet, Rfl: 2  .  vitamin D3 (VITAMIN D3) 1000 units capsule, Take 1,000 Units by mouth daily., Disp: , Rfl:   .  ziprasidone (GEODON) 40 MG capsule, Take 2 capsules (80 mg) by mouth 2 times daily (with meals)., Disp: 60 capsule, Rfl: 2  .  zonisamide (ZONEGRAN) 100 MG capsule, Take 2 capsules (200 mg) by mouth daily., Disp: 180 capsule, Rfl: 1    Past Medical History:   Diagnosis Date   . Bipolar affect, depressed (CMS-HCC)    . Chronic  back pain    . Hypothyroidism    . Insomnia    . OAB (overactive bladder)    . OCD (obsessive compulsive disorder)    . Osteopenia    . Schizophrenia (CMS-HCC)        No Known Allergies    PHYSICAL EXAMINATION:  Temperature: 97.5 F (36.4 C) (02/03 1400)  Blood pressure (BP): 132/73 (02/03 1400)  Heart Rate: 72 (02/03 1400)  GENERAL:  Patient is a well appearing and in no acute distress.   RESPIRATIONS:  No respiratory distress, breathing non-labored.  CARDIOVASCULAR:  Regular rate, rhythm.   ABDOMEN:  Soft, nontender, nondistended  PSYCH: Normal mood and affect  NEURO: A&O x 3; normal gait        ASSESSMENT:  Julie Monroe is a 73 year old woman with urgency urinary incontinence, at times without sensory awareness.      PLAN:  - Discussed options including observation, change of medication, decreased caffeine intake, and third line therapies  - I emphasized the importance of decreasing caffeine intake  - For third line therapy we discussed PTNS,  botox, and interstim. I discussed the pros and cons of each in detail with her today  - At this time does not wish to pursue invasive treatment though I did advise I recommend UDS prior to these  - Will change medication to vesicare, pending insurance coverage. I asked her to call me if not covered.  - Follow up 2 months

## 2018-03-18 NOTE — Telephone Encounter (Signed)
PTNS  Cpt [id number]  Dx code N39.41    Proven pt with information above.

## 2018-03-19 ENCOUNTER — Telehealth: Payer: Self-pay | Admitting: Urology

## 2018-03-19 NOTE — Telephone Encounter (Signed)
Patient states insurance does not cover the vesicare and would like an alternate medication called into her pharmacy. Please call and advise, thanks

## 2018-03-19 NOTE — Telephone Encounter (Signed)
Fwd msg to MD

## 2018-03-20 ENCOUNTER — Telehealth: Payer: Self-pay | Admitting: Urology

## 2018-03-20 MED ORDER — SOLIFENACIN SUCCINATE 5 MG OR TABS
5.0000 mg | ORAL_TABLET | Freq: Every day | ORAL | 3 refills | Status: DC
Start: 2018-03-20 — End: 2018-04-08

## 2018-03-20 NOTE — Telephone Encounter (Signed)
Patient requesting for doctor to send RX for generic of VESICARE. She states generic brand on cost $15. Brand cost $300. Please assist.

## 2018-03-20 NOTE — Telephone Encounter (Signed)
Fwd msg to MD

## 2018-03-21 ENCOUNTER — Telehealth: Payer: Self-pay | Admitting: Urology

## 2018-03-21 ENCOUNTER — Ambulatory Visit: Payer: Medicare Other | Attending: Ophthalmology | Admitting: Ophthalmology

## 2018-03-21 DIAGNOSIS — H2513 Age-related nuclear cataract, bilateral: Secondary | ICD-10-CM | POA: Insufficient documentation

## 2018-03-21 DIAGNOSIS — H04129 Dry eye syndrome of unspecified lacrimal gland: Principal | ICD-10-CM | POA: Insufficient documentation

## 2018-03-21 DIAGNOSIS — H539 Unspecified visual disturbance: Secondary | ICD-10-CM | POA: Insufficient documentation

## 2018-03-21 NOTE — Progress Notes (Signed)
1. Dry eyes  2. MGD  -List of pf ats given use often    3. Cataracts, both eyes  -to see optom  -not yet Vs yet    I performed the above HPI. I reviewed and confirmed the techs review of systems, past histories, and readings.    Prentice Docker, M.D.  Assistant Professor of Ophthalmology  [facility]

## 2018-03-21 NOTE — Telephone Encounter (Signed)
Spoke Julie Monroe ref [phone]    [phone]  Julie Monroe

## 2018-03-21 NOTE — Telephone Encounter (Signed)
2nd call     Please see previous message. Patient wanting to schedule as soon as possible.

## 2018-03-21 NOTE — Telephone Encounter (Signed)
Spoke with pharmacy, stated will need PA.    Submitted with covermymeds KEY #[id number]

## 2018-03-21 NOTE — Telephone Encounter (Signed)
Patient would like to schedule PTNS appointment and would like to know if ptns is available fridays. Please call patient to schedule, thanks

## 2018-03-22 NOTE — Telephone Encounter (Signed)
Patient is requesting a call back from nurse to inquire about setting up PTNS appointments. Thank you.

## 2018-03-22 NOTE — Telephone Encounter (Signed)
Appointment made for 2/10, pt has questions for MD

## 2018-03-25 ENCOUNTER — Telehealth: Payer: Self-pay | Admitting: Urology

## 2018-03-25 ENCOUNTER — Encounter: Payer: Self-pay | Admitting: Urology

## 2018-03-25 ENCOUNTER — Ambulatory Visit (INDEPENDENT_AMBULATORY_CARE_PROVIDER_SITE_OTHER): Payer: Medicare Other | Admitting: Urology

## 2018-03-25 ENCOUNTER — Telehealth: Payer: Self-pay | Admitting: Internal Medicine

## 2018-03-25 VITALS — BP 138/76 | HR 66 | Temp 97.0°F | Ht 67.0 in | Wt 260.0 lb

## 2018-03-25 DIAGNOSIS — N3941 Urge incontinence: Principal | ICD-10-CM

## 2018-03-25 NOTE — Telephone Encounter (Signed)
Patient states her endocrinologist faxed over labs she had done back in October. She wants to know should she redo blood work that [facility] ordered.    Please assist

## 2018-03-25 NOTE — Telephone Encounter (Signed)
SPOKE TO PATIENT AND INFORMED THAT MEDICARE DOES NOT REQUIRE AUTHORIZATION FOR PTNS. HER SECONDARY INSURANCE ALSO DOES NOT REQUIRE PRE-CERTIFICATION. PATIENT VERBALIZED UNDERSTANDING.

## 2018-03-25 NOTE — Telephone Encounter (Signed)
I reviewed her labs from 12/05/2017 which I will scan into her chart.  Overall look well controlled -- thyroid was abnl but I see that her endocrinologist is managing that. So,that said, she can do her labs that I ordered on 03/13/2018 as a six month f/u to her labs from 12/05/2017 (so in 05/2018) and can coordinate with her labs with her endocrinologist.     Thank you.    CBC: nl  B12: 307  AIC: 5.2  Chol: TC 206, TG 72, HDL 70, LDL 122  CMP: nl  TSH: 0.656  FT4: 1.27  FT3: 2.4

## 2018-03-25 NOTE — Telephone Encounter (Signed)
Placed on your desk last week. Please advise.

## 2018-03-25 NOTE — Telephone Encounter (Signed)
Patient is requesting a call back from authorization coordinator regarding authorization for PTNS for today. Please contact patient for further assistance. Thank you.

## 2018-03-25 NOTE — Progress Notes (Signed)
Julie Monroe  [address]  [place], [place] [address]  [phone]      Urology Follow Up Note      HISTORY OF PRESENT ILLNESS:  Julie Monroe is a 73 year old-year-old woman here for follow up. She has a history of urgency urinary incontinence. Cystoscopy on 01/28/18 demonstrated no lesions. She has been treated with oxybutynin as well as myrbetriq in the past, each for >8 weeks. She was also advised to stop drinking soda. She had no change in symptoms with medication.     Today she reports she has considered third line therapy and would like to proceed with PTNS.    Review of Systems - no fevers or dysuria      Current Outpatient Medications:   .  ARIPiprazole (ABILIFY) 30 MG tablet, Take 1 tablet (30 mg) by mouth daily., Disp: 90 tablet, Rfl: 2  .  aspirin 81 MG tablet, Take 81 mg by mouth daily., Disp: , Rfl:   .  BIOTIN PO, Take 1 capsule by mouth daily., Disp: , Rfl:   .  Calcium Carbonate-Vit D-Min (CALCIUM 1200 PO), Take 1 tablet by mouth daily., Disp: , Rfl:   .  Cholecalciferol (VITAMIN D3) 50 MCG (2000 UT) capsule, Take 1 capsule by mouth daily., Disp: , Rfl:   .  Clobetasol Propionate (IMPOYZ) 0.025 % CREA, Apply topically., Disp: , Rfl:   .  desvenlafaxine (PRISTIQ) 50 MG TB24, Take 1 tablet (50 mg) by mouth daily., Disp: 90 tablet, Rfl: 3  .  fluphenazine (PROLIXIN) 2.5 MG tablet, Take 1 tablet (2.5 mg) by mouth 2 times daily., Disp: 60 tablet, Rfl: 1  .  gabapentin (NEURONTIN) 600 MG tablet, Take 2 tablets (1,200 mg) by mouth at bedtime., Disp: 180 tablet, Rfl: 3  .  levothyroxine (SYNTHROID) 112 MCG tablet, Take 112 mcg by mouth daily., Disp: , Rfl: 0  .  omega-3 fatty acids, OTC, 1000 MG CAPS, Take 1 g by mouth daily (with food)., Disp: , Rfl:   .  Psyllium (METAMUCIL FIBER PO), Take 1 capsule by mouth daily., Disp: , Rfl:   .  QUEtiapine (SEROQUEL) 300 MG tablet, Take 1 tablet (300 mg) by mouth nightly., Disp: 30 tablet, Rfl: 3  .  Red Yeast Rice Extract (RED YEAST RICE PO), Take  1 tablet by mouth daily., Disp: , Rfl:   .  sertraline (ZOLOFT) 100 MG tablet, Take 1 tablet (100 mg) by mouth daily., Disp: 30 tablet, Rfl: 3  .  solifenacin (VESICARE) 5 MG tablet, Take 1 tablet (5 mg) by mouth daily., Disp: 30 tablet, Rfl: 3  .  trihexyphenidyl (ARTANE) 1 MG tablet, Take 1 each (1 mg) by mouth 2 times daily., Disp: 60 tablet, Rfl: 3  .  trihexyphenidyl (ARTANE) 2 MG tablet, TAKE 1/2 TABLET BY MOUTH TWICE DAILY, Disp: 90 tablet, Rfl: 2  .  vitamin D3 (VITAMIN D3) 1000 units capsule, Take 1,000 Units by mouth daily., Disp: , Rfl:   .  ziprasidone (GEODON) 40 MG capsule, Take 2 capsules (80 mg) by mouth 2 times daily (with meals)., Disp: 60 capsule, Rfl: 2  .  zonisamide (ZONEGRAN) 100 MG capsule, Take 2 capsules (200 mg) by mouth daily., Disp: 180 capsule, Rfl: 1    Past Medical History:   Diagnosis Date   . Bipolar affect, depressed (CMS-HCC)    . Chronic back pain    . Hypothyroidism    . Insomnia    . OAB (overactive  bladder)    . OCD (obsessive compulsive disorder)    . Osteopenia    . Schizophrenia (CMS-HCC)        No Known Allergies    PHYSICAL EXAMINATION:  Temperature: 97 F (36.1 C) (02/10 1529)  Blood pressure (BP): 138/76 (02/10 1529)  Heart Rate: 66 (02/10 1529)  GENERAL:  Patient is a well appearing and in no acute distress.   RESPIRATIONS:  No respiratory distress, breathing non-labored.  CARDIOVASCULAR:  Regular rate, rhythm.   ABDOMEN:  Soft, nontender, nondistended  PSYCH: Normal mood and affect  NEURO: A&O x 3; normal gait        ASSESSMENT:  Julie Monroe is a 73 year old woman with urgency urinary incontinence, refractory to medications.      PLAN:  Risks/benefits/alternatives of PTNS discussed in detail today with patient and her husband  Start PTNS today. PTNS note per Elain. I supervised this procedure  Reviewed again that she should cut down on caffeine intake.  Follow up for weekly PTNS

## 2018-03-25 NOTE — Telephone Encounter (Signed)
Detailed message left for pt, as previously authorized, with Dr. Sasson's response below.

## 2018-03-25 NOTE — Interdisciplinary (Signed)
Office Visit. PTNS Tx# 1. Pt placed in an upright sitting position with bilateral extremities elevated. Implementing clean technique, area of needle placement and sole of left foot cleansed with alcohol wipe. 0.22 X 40G sterile needle electrode placed at a point 3 fingerbreadths above and posterior to the medial malleolous. Ground placed to sole of left foot. Neuromodulation system attached to the placed needle electrode. PTNS Tx begun. Pt verbalized positive sensation to at inner heel to toes at Level 19. Pt left in room for 30 minutes.    After 30 minutes Neuromodulation system removed from placed needle, ground removed and needle removed. Pt tolerated PTNS Tx well. Advised to call with any questions or concerns. Pt verbalized understanding. Next PTNS Tx has been scheduled.

## 2018-03-26 ENCOUNTER — Telehealth: Payer: Self-pay | Admitting: Internal Medicine

## 2018-03-26 NOTE — Telephone Encounter (Signed)
Spoke with patient and questions answered. She gets her labs done at [facility], she states. Encouraged her to contact her insurance and let them know she had labs drawn in October, list the labs orders by Dr. [name], and ask if they will be covered in April. Some insurance cover once a calendar year as opposed to 12 month cycle. She verbalized understanding and agreed to call her insurance.

## 2018-03-26 NOTE — Telephone Encounter (Signed)
Patient Is calling states she would like to speak to leslie regarding labs states that medicare will not cover labs in 6 months     transferred call to the clinic

## 2018-03-27 ENCOUNTER — Telehealth: Payer: Self-pay | Admitting: Urology

## 2018-03-27 ENCOUNTER — Telehealth: Payer: Self-pay

## 2018-03-27 NOTE — Telephone Encounter (Signed)
Speak with Diane from [facility] at [phone] in regard to Solifenacin 5 mg.      A prior auth for Solifenacin is generated thru the phone with a reference # [id number].      Status is pending.

## 2018-03-27 NOTE — Telephone Encounter (Signed)
Spoke w/ pt all questions and concerns addressed, appts scheduled for PTNS

## 2018-03-27 NOTE — Telephone Encounter (Signed)
Patient requesting a call back from office concerning having questions on PTNS.   She also would like to reschedule her appointment that she currently has for 04/02/2018 to 04/03/2018 if possible. she states she would like to schedule her 3rd PTN appointment for Monday 04/08/2018 around 9am.  Patient has questions concerning if she should take RX Solifenacin in conjunction with PTNS injection. She also has question concerning intensity of injection. Please call her for assistance. thank you

## 2018-03-28 NOTE — Telephone Encounter (Signed)
Received a document from [facility] notifying that it is denied for Solifenacin 5 mg.  Dr. Cecilie Lowers is notified for possible appeal.

## 2018-03-28 NOTE — Telephone Encounter (Signed)
Per Dr. Cecile Hearing: "Can you just find out what alternatives other than oxybutynin are covered?"      Call the [facility] pharmacy and speak with [facility] who does not have any information about another coverage.

## 2018-03-29 ENCOUNTER — Ambulatory Visit: Payer: Medicare Other | Admitting: Urology

## 2018-04-02 ENCOUNTER — Ambulatory Visit: Payer: Medicare Other | Admitting: Urology

## 2018-04-03 ENCOUNTER — Encounter: Payer: Self-pay | Admitting: Urology

## 2018-04-03 ENCOUNTER — Ambulatory Visit (INDEPENDENT_AMBULATORY_CARE_PROVIDER_SITE_OTHER): Payer: Medicare Other | Admitting: Psychiatry

## 2018-04-03 ENCOUNTER — Ambulatory Visit (INDEPENDENT_AMBULATORY_CARE_PROVIDER_SITE_OTHER): Payer: Medicare Other | Admitting: Urology

## 2018-04-03 VITALS — BP 134/74 | HR 68 | Temp 97.8°F | Resp 18

## 2018-04-03 DIAGNOSIS — G4700 Insomnia, unspecified: Secondary | ICD-10-CM

## 2018-04-03 DIAGNOSIS — N3941 Urge incontinence: Principal | ICD-10-CM

## 2018-04-03 DIAGNOSIS — F250 Schizoaffective disorder, bipolar type: Secondary | ICD-10-CM

## 2018-04-03 DIAGNOSIS — F419 Anxiety disorder, unspecified: Secondary | ICD-10-CM

## 2018-04-03 MED ORDER — FLUPHENAZINE HCL 5 MG OR TABS
5.0000 mg | ORAL_TABLET | Freq: Every day | ORAL | 0 refills | Status: DC
Start: 2018-04-03 — End: 2018-06-13

## 2018-04-03 NOTE — Progress Notes (Signed)
Julie Glasgow, MD  Assistant Clinical Professor of Urology  [facility] -- [address]  [address], [place] [address]  [phone]      Follow-up Visit    Visit Date: 04/03/2018    Primary Care Physician: Leonia Reader     Chief Complaint/Reason for Visit: urgency incontinence.    History of Present Illness:  Julie Monroe is a 73 year old female who initially presented with history of urgency incontinence, has undergone evaluation which included cystoscopy, and trials of anticholinergic medications and Myrbetriq in the past.  Ultimately after failing medical management or not satisfied in her response to medical management or lifestyle modification she has initiated PTNS.    Follows up today for her 2nd treatment reporting that she thinks he has already seen some improvement since starting treatment 1 or 2 weeks ago, denies any dysuria gross hematuria that she is reporting some swelling in her ankles which he just noticed upon arrival to our office today after a 2 hour car ride..    Past Medical History:  Patient Active Problem List   Diagnosis   . Schizoaffective disorder, bipolar type (CMS-HCC)   . Insomnia   . Morbid obesity (CMS-HCC)   . Tremor   . Osteoarthrosis   . Hypothyroidism   . Obsessive-compulsive disorder, unspecified type   . Anxiety   . Extrapyramidal and movement disorder     Past Medical History:   Diagnosis Date   . Bipolar affect, depressed (CMS-HCC)    . Chronic back pain    . Hypothyroidism    . Insomnia    . OAB (overactive bladder)    . OCD (obsessive compulsive disorder)    . Osteopenia    . Schizophrenia (CMS-HCC)        Medications:  Current Outpatient Medications   Medication Sig Dispense Refill   . ARIPiprazole (ABILIFY) 30 MG tablet Take 1 tablet (30 mg) by mouth daily. 90 tablet 2   . aspirin 81 MG tablet Take 81 mg by mouth daily.     Marland Kitchen BIOTIN PO Take 1 capsule by mouth daily.     . Calcium Carbonate-Vit D-Min (CALCIUM 1200 PO) Take 1 tablet by mouth  daily.     . Cholecalciferol (VITAMIN D3) 50 MCG (2000 UT) capsule Take 1 capsule by mouth daily.     . Clobetasol Propionate (IMPOYZ) 0.025 % CREA Apply topically.     Marland Kitchen desvenlafaxine (PRISTIQ) 50 MG TB24 Take 1 tablet (50 mg) by mouth daily. 90 tablet 3   . fluPHENAZine (PROLIXIN) 5 MG tablet Take 1 tablet (5 mg) by mouth daily. 90 tablet 0   . gabapentin (NEURONTIN) 600 MG tablet Take 2 tablets (1,200 mg) by mouth at bedtime. 180 tablet 3   . levothyroxine (SYNTHROID) 112 MCG tablet Take 112 mcg by mouth daily.  0   . omega-3 fatty acids, OTC, 1000 MG CAPS Take 1 g by mouth daily (with food).     . Psyllium (METAMUCIL FIBER PO) Take 1 capsule by mouth daily.     . QUEtiapine (SEROQUEL) 300 MG tablet Take 1 tablet (300 mg) by mouth nightly. 30 tablet 3   . Red Yeast Rice Extract (RED YEAST RICE PO) Take 1 tablet by mouth daily.     . sertraline (ZOLOFT) 100 MG tablet Take 1 tablet (100 mg) by mouth daily. 30 tablet 3   . solifenacin (VESICARE) 5 MG tablet Take 1 tablet (5 mg) by mouth daily.  30 tablet 3   . trihexyphenidyl (ARTANE) 1 MG tablet Take 1 each (1 mg) by mouth 2 times daily. 60 tablet 3   . trihexyphenidyl (ARTANE) 2 MG tablet TAKE 1/2 TABLET BY MOUTH TWICE DAILY 90 tablet 2   . vitamin D3 (VITAMIN D3) 1000 units capsule Take 1,000 Units by mouth daily.     . ziprasidone (GEODON) 40 MG capsule Take 2 capsules (80 mg) by mouth 2 times daily (with meals). 60 capsule 2     No current facility-administered medications for this visit.         Allergies:  No Known Allergies     Physical Examination:  Vital Signs: BP 134/74 (BP Location: Right arm, BP Patient Position: Sitting, BP cuff size: Regular)   Pulse 68   Temp 97.8 F (36.6 C) (Oral)   Resp 18   LMP  (LMP Unknown)   General: Awake, alert, obese, appears to be approximate age  HEENT: Normocephalic, atraumatic  Neck: Supple, trachea midline  Lungs: Normal respiratory effort  Abdomen: Soft, nontender, and nondistended  Extremities: 1+bilateral edema  LE. Symmetrical, no calf TTP    Treatment in R ankle today      Assessment: 72 year old female with urgency incontinence, responding (very early) to PTNS    Plan:    Total data review of medical history and data, as documented above, and development of new diagnosis and/or the decision making process for this patient (as documented below) to determine appropriate course of further evaluation and treatment is deemed as moderate.     Urge incontinence:  I briefly reviewed this diagnosis with the patient, her current treatment plan, anticipated response, which is typically only after 6 or 8 weeks of treatment, but somewhat reassuring that she is already responding this early on in her treatment.  I again reviewed with her how she is anticipated to have 12 weekly treatments over the course of the next 2-3 months, and she may continue to see gradual improvement during that time.  She will follow up in approximately 1 week for her next treatment and was encouraged to contact our office with any questions or changes.    Although this is an established urology patient, this patient is new to me as an examiner, and so review of history, both urological and non-urological added to the time and complexity of this visit.      At the conclusion of this encounter all of the patient's questions were answered to satisfaction, encouraged to contact our office at any time if there are any further questions or issues, and we will follow up as described above.    Portions of documentation was done with dictation software, and so there may be errors despite best efforts to review and edit.      Summary:   Continue with PTNS    Julie Monroe, M.D.  04/03/2018

## 2018-04-03 NOTE — Interdisciplinary (Signed)
Office Visit. PTNS Tx# 2. Pt placed in an upright sitting position with bilateral extremities elevated. Implementing clean technique, area of needle placement and sole of left foot cleansed with alcohol wipe. 0.22 X 40G sterile needle electrode placed at a point 3 fingerbreadths above and posterior to the medial malleolous. Ground placed to sole of left foot. Neuromodulation system attached to the placed needle electrode. PTNS Tx begun. Pt verbalized positive sensation toat inner heel to toesat Level19. Pt left in room for 30 minutes.    After 30 minutes Neuromodulation system removed from placed needle, ground removed and needle removed. Pt tolerated PTNS Tx well. Advised to call with any questions or concerns. Pt verbalized understanding. Next PTNS Tx has been scheduled.

## 2018-04-03 NOTE — Progress Notes (Addendum)
Outpatient Psychiatry Progress Note      Chief Complaint: Regularly scheduled follow-up appointment      Identifying Information:  Julie Monroe is a 73 year old female last seen on 02/20/2018 who presents today for a follow-up appointment.     Interval History:  Julie Monroe 73 year oldF with a past psychiatric history of schizoaffective d/o, bipolar type, OCD and unspecified insomnia who presents for follow-up. Pt reports mood, "Good", and denies suicidal ideation, intent and plan. Pt denies associated depressive symptoms incl amotivation, anhedonia, decreased interest, decreased interest and hopelessness. Pt reports stable anxiety. Pt denies auditory and visual hallucinations, and does not endorse obvious delusions at this encounter. Pt denies current or recent decreased need for sleep or increased energy > 4 d, hyperverbality, racing thoughts, grandiosity and goal-directed bevavior c/w manic or hypomanic episode at this encounter. Pt reports improved quality of sleep, and reports regular sleep schedule. Pt reports compliance with psychotropic medications as directed, and denies AEs. Discussed indication for zonisamide. Pt reports zonisamide started for mood stabilization, and denies h/o seizures. Pt reports zonisamide started with bupropion, given outside provider concern for lowered seizure threshold. Discussed risks, benefits and alternate treatments, and shared decision made to discontinue at this time. Discussed stressor of recently increased pricing of fluphenazine, and provider provided pt with GoodRx coupon and offer alternatives to seeking better coverage for medication (esp given stability afforded with this medication).     Review of Systems:  Denies fever, chills, chest pain, dyspnea, dizziness, syncope, recent falls, headaches, nausea, and vomiting.    Current Outpatient Medications:    Current Outpatient Medications:   .  ARIPiprazole (ABILIFY) 30 MG tablet, Take 1 tablet (30 mg) by mouth  daily., Disp: 90 tablet, Rfl: 2  .  aspirin 81 MG tablet, Take 81 mg by mouth daily., Disp: , Rfl:   .  BIOTIN PO, Take 1 capsule by mouth daily., Disp: , Rfl:   .  Calcium Carbonate-Vit D-Min (CALCIUM 1200 PO), Take 1 tablet by mouth daily., Disp: , Rfl:   .  Cholecalciferol (VITAMIN D3) 50 MCG (2000 UT) capsule, Take 1 capsule by mouth daily., Disp: , Rfl:   .  Clobetasol Propionate (IMPOYZ) 0.025 % CREA, Apply topically., Disp: , Rfl:   .  desvenlafaxine (PRISTIQ) 50 MG TB24, Take 1 tablet (50 mg) by mouth daily., Disp: 90 tablet, Rfl: 3  .  fluPHENAZine (PROLIXIN) 5 MG tablet, Take 1 tablet (5 mg) by mouth daily., Disp: 90 tablet, Rfl: 0  .  gabapentin (NEURONTIN) 600 MG tablet, Take 2 tablets (1,200 mg) by mouth at bedtime., Disp: 180 tablet, Rfl: 3  .  levothyroxine (SYNTHROID) 112 MCG tablet, Take 112 mcg by mouth daily., Disp: , Rfl: 0  .  omega-3 fatty acids, OTC, 1000 MG CAPS, Take 1 g by mouth daily (with food)., Disp: , Rfl:   .  oxybutynin (DITROPAN XL) 10 MG Controlled-Release tablet, TAKE 1 TABLET BY MOUTH EVERY DAY, Disp: 90 tablet, Rfl: 3  .  Psyllium (METAMUCIL FIBER PO), Take 1 capsule by mouth daily., Disp: , Rfl:   .  QUEtiapine (SEROQUEL) 300 MG tablet, Take 1 tablet (300 mg) by mouth nightly., Disp: 30 tablet, Rfl: 3  .  Red Yeast Rice Extract (RED YEAST RICE PO), Take 1 tablet by mouth daily., Disp: , Rfl:   .  sertraline (ZOLOFT) 100 MG tablet, Take 1 tablet (100 mg) by mouth daily., Disp: 30 tablet, Rfl: 3  .  trihexyphenidyl (ARTANE) 1 MG  tablet, Take 1 each (1 mg) by mouth 2 times daily., Disp: 60 tablet, Rfl: 3  .  trihexyphenidyl (ARTANE) 2 MG tablet, TAKE 1/2 TABLET BY MOUTH TWICE DAILY, Disp: 90 tablet, Rfl: 2  .  vitamin D3 (VITAMIN D3) 1000 units capsule, Take 1,000 Units by mouth daily., Disp: , Rfl:   .  ziprasidone (GEODON) 40 MG capsule, Take 2 capsules (80 mg) by mouth 2 times daily (with meals)., Disp: 60 capsule, Rfl: 2    Vital Signs:  LMP  (LMP Unknown)     Mental Status  Exam:    Appearance: As stated age, good grooming/hygiene, wearing appropriate clothing  Behavior: Cooperative. Fair eye contact. Calm.  Gait: Steady gait  Muscle Strength/Tone:Resolved perioral and facial twitching and intervally improved bilateral upper extremity tremor  Speech: Normal rate, tone and volume  Mood: "Good"  Affect:Mildly constricted  Thought Process: Linear, goal-directed   - Associations: Intact  Thought Content: No suicidal or homicidal ideation. Not responding to internal stimuli. No auditory or visual hallucinations.  Attention Span and Concentration: Not distracted in conversation  Language: Able to name objects  Fund of Knowledge: Average  Memory: Both recent and remote memories are intact in context of conversation  Orientation: Intact to person, place  Insight/Judgment:Improved    LABS/ OTHER STUDIES    CHEM 7  No results found for: NA, K, CL, CO2, BUN, CR, GLU, [place], MG  LFT's  No results found for: AST, ALT, ALP, ALB, TP, TBIL  CBC  No results found for: WBC, HGB, HCT, PLT  LIPID PANEL  No results found for: CHOL, LDLD, HDL, TRIG  HGBA1c  No components found for: A1C;3  Miscellaneous   No results found for: TSH, FT4     Scales:   No flowsheet data found.  No flowsheet data found.    AIMS (02/07/18):  Facial & Oral Movements: 6  Extremity movements: 3  Trunk Movement: 0  Global Judgment: 1  Total: 10    AIMS (02/20/17):  Facial & Oral Movements: 0  Extremity movements: 2  Trunk Movement: 0  Global Judgment: 1  Total: 3    Assessment:   Lache Monroe a73 year oldF with a past psychiatric history of schizoaffective d/o, bipolar type, OCD and unspecified insomnia who presents for follow-up.Pt demonstrates chronic, stable psychotic delusion, with some evidence of cognitive decline, thought process disorganization and affective blunting.Pt reports stably improved mood and associated depressive symptoms. Plan to maintain sertraline at current dosage, as pt reports previous,  long-term mood stability on dual antidepressants. Plan to discontinue zonisamide given unclear benefit for mood stabilization. At future encounters, plan to downtitrate quetiapine for insomnia, as pt expresses specific concern for weight gain and sedation.    She is assessed to be at alow acute risk of suicide.   Clinical Global Impression - Severity:4= Moderately ill  :Suggested Guidelines= Overt symptomatology causing noticeable, but modest, functional impairment. .    Diagnosis/Diagnoses:     ICD-10-CM ICD-9-CM   1. Schizoaffective disorder, bipolar type (CMS-HCC) F25.0 295.70   2. Anxiety F41.9 300.00   3. Insomnia, unspecified type G47.00 780.52   4. Extrapyramidal and movement disorder G25.9 333.90       Plan:  Medication Management: Risks, benefits, and alternatives of medications were discussed today with Wynona Caneshristine and she consents to regimen.   Continue fluphenazine 2.5 mgPOBID   Continue aripiprazole 30 mgPOqDaily   Continue ziprasidone 80 mgPOqDaily   Continue quetiapine 300 mg PO QHS  ? Plan to  downtitrate at future encounter   Continuetrihedyphenidyl(Artane)1 mg POBIDfor EPS   Continue gabapentin 1200 mgPO QHS forinsomnia   Continue desvenlafaxine 50 mgqDaily   Continuesertraline 100 mg qDaily  ? Note: Depression worsened after recently stopping medication   D/c zonisamide    In-session psychotherapy:  I spent 19 minutes providing psychotherapy for this encounter.  Treatment modality(ies) employed: Psychoeducation and supportive  Progress to date: Improving  -- Discussed financial stressor of recently increased medication prices, and offered alternatives to seeking better coverage in addition to [employer] coupon  -- Psychoeducation regarding current regimen, esp sleep aids, and AEs of antipsychotics incl weight gain  -- Psychoeducation regarding current regimen, and possible future plans to reduce polypharmacy to meet pt therapeutic goals to reduce weight gain and sedation  --  F/u SMART goals  -- Walk of any length at least one per week  -- Go to gym of any length at least once per week  -- Extensive psychoeducation re clozapine and cognitive effects of anticholinergics discussed  Treatment plan: Continue psychoeducation and supportive  Level of Care: Pt currently does not meet criteria for inpatient psychiatric hospitalization.    Note Author:  Elayne Guerin, MD  Resident/Fellow Physician

## 2018-04-04 NOTE — Telephone Encounter (Signed)
Speak with Julie Monroe from [facility] stating that the her medical insurance covers Toviaz 4 mg and 8 mg and Myrbetriq 25 mg and 50 mg.

## 2018-04-06 ENCOUNTER — Telehealth: Payer: Self-pay | Admitting: Student in an Organized Health Care Education/Training Program

## 2018-04-06 NOTE — Telephone Encounter (Signed)
Called complaining of right foot pain after PTNS treatment. Explained of management options of her symptoms and discomfort including RICE. The patient is amenable to these options.     She would like additional supervision during PTNS treatment on Monday at 9AM. Was informed that I documented her request here in Epic, and she was agreeable to this.     Cain Sieve, MD

## 2018-04-08 ENCOUNTER — Encounter: Payer: Self-pay | Admitting: Urology

## 2018-04-08 ENCOUNTER — Telehealth: Payer: Self-pay | Admitting: Urology

## 2018-04-08 ENCOUNTER — Encounter (INDEPENDENT_AMBULATORY_CARE_PROVIDER_SITE_OTHER): Payer: Medicare Other | Admitting: Cardiovascular Disease

## 2018-04-08 ENCOUNTER — Other Ambulatory Visit: Payer: Self-pay | Admitting: Urology

## 2018-04-08 ENCOUNTER — Ambulatory Visit (INDEPENDENT_AMBULATORY_CARE_PROVIDER_SITE_OTHER): Payer: Medicare Other | Admitting: Urology

## 2018-04-08 ENCOUNTER — Other Ambulatory Visit: Payer: Medicare Other

## 2018-04-08 VITALS — BP 132/72 | HR 74 | Temp 97.8°F | Resp 16 | Ht 67.0 in | Wt 257.9 lb

## 2018-04-08 DIAGNOSIS — R011 Cardiac murmur, unspecified: Principal | ICD-10-CM

## 2018-04-08 DIAGNOSIS — N3941 Urge incontinence: Principal | ICD-10-CM

## 2018-04-08 MED ORDER — OXYBUTYNIN CHLORIDE 10 MG OR TB24
10.0000 mg | ORAL_TABLET | Freq: Every day | ORAL | 3 refills | Status: DC
Start: 2018-04-08 — End: 2018-04-08

## 2018-04-08 MED ORDER — OXYBUTYNIN CHLORIDE 10 MG OR TB24
ORAL_TABLET | ORAL | 3 refills | Status: DC
Start: 2018-04-08 — End: 2018-07-22

## 2018-04-08 NOTE — Telephone Encounter (Signed)
Patient called to advice that she will be late to appointment today for PTNS. she will be here at 9:30am. Called office to advice spoke Reme who stated that would be fine.  Advised patient and she  Understood. Thank you

## 2018-04-08 NOTE — Progress Notes (Signed)
Patient here today for PTNS #3. She had some foot numbness after the last treatment which remains but is improved. She is still taking oxybutynin. She notes improvement on PTNS. Will cont with treatment and voiding diary given to her today.

## 2018-04-08 NOTE — Interdisciplinary (Signed)
Office Visit and PTNS Tx #3. Pt positioned sitting upright with bilateral extremities elevated on exam table. Area of needle placement and ground placement cleansed with alcohol wipe. 0.22 X 40G sterile needle electrode placed at a point 3 fingerbreadths above and posterior to the medial malleolous, ground placed to sole of left foot and Neuromodulation system attached to the placed needle electrode. PTNS Tx begun. Pt verbalized positive sensation to the toes of left foot at Level 4. Pt left in room for 30 minutes per protocol.    After 30 minutes Neuromodulation system detached from placed needle, ground removed and needle removed. Pt tolerated PTNS Tx well. Appt has been made for next PTNS Tx. Advised to call with any questions or concerns. Pt verbalized understanding.

## 2018-04-09 ENCOUNTER — Telehealth: Payer: Self-pay | Admitting: Internal Medicine

## 2018-04-09 NOTE — Progress Notes (Signed)
Attending Note:    Subjective:  I reviewed the history.  Patient interviewed and examined with the resident.    Review of Systems (ROS): As per the resident's note.  Past Medical, Family, Social History: As per the resident's note.    Objective:  I have examined the patient and I concur with the resident's exam.    Assessment and Plan:  Reviewed with the resident physician.  I agree with the resident's plan as documented.    See the resident's note for further details.    Tiona Ruane, M.D.  Attending Physician  Professor of Clinical Psychiatry

## 2018-04-09 NOTE — Addendum Note (Signed)
Addended by: Cathie Hoops on: 04/09/2018 04:45 PM     Modules accepted: Level of Service

## 2018-04-09 NOTE — Telephone Encounter (Signed)
Pt is calling requesting for sooner appt. Currently scheduled on 3/18. Also requesting to get the echocardiogram results she had yesterday. Please call pt back for sooner appt and advise. Thank you

## 2018-04-09 NOTE — Telephone Encounter (Signed)
Please let her know her study results:    Summary:   1. Normal left ventricular systolic function. The left ventricular ejection fraction is 58% by Biplane.   2. Mild aortic valve stenosis.   3. Mildly enlarged right ventricle.   4. Left atrium mildly dilated.    These are mild changes, no significantly concerning findings, esp given her age and lack of symptoms.  Ideally, we would be able to review her labs as well as her echo results, but she has not done her labs yet.  Please convey her echo results to her, and then we can see her in person after she has also finished her labs, etc.      Thank you.

## 2018-04-10 ENCOUNTER — Telehealth: Payer: Self-pay | Admitting: Urology

## 2018-04-10 ENCOUNTER — Telehealth: Payer: Self-pay | Admitting: Internal Medicine

## 2018-04-10 NOTE — Telephone Encounter (Signed)
Message left for pt that we'll respond to her phone inquiry via MyChart since we did not reach her in person this morning. Also encouraged to get labs done before her mid-March appt so PCP can review all results with her at that visit.

## 2018-04-10 NOTE — Telephone Encounter (Signed)
Changed appt to 9am per pt's request

## 2018-04-10 NOTE — Telephone Encounter (Signed)
Returning call. Requesting to speak with Julie Monroe. Transfer to clinic

## 2018-04-10 NOTE — Telephone Encounter (Signed)
Spoke with patient and relayed results of her echo with her as outlined by Dr. [name] in phone note yesterday. She verbalized understanding and was appreciative. States she doesn't know what she was thinking when she asked for sooner appt. She acutally wants to rescheduled from March to early May. Appt rescheduled per her request. States she'll get labs drawn about a week before. Very appreciative.

## 2018-04-10 NOTE — Telephone Encounter (Signed)
Patient is requesting a call back from nurse to reschedule PTNS appointment from 03/03. Thank you.

## 2018-04-15 ENCOUNTER — Ambulatory Visit: Payer: Medicare Other | Admitting: Internal Medicine

## 2018-04-15 ENCOUNTER — Ambulatory Visit: Payer: Medicare Other | Admitting: Urology

## 2018-04-16 ENCOUNTER — Encounter: Payer: Self-pay | Admitting: Urology

## 2018-04-16 ENCOUNTER — Ambulatory Visit (INDEPENDENT_AMBULATORY_CARE_PROVIDER_SITE_OTHER): Payer: Medicare Other | Admitting: Urology

## 2018-04-16 ENCOUNTER — Telehealth: Payer: Self-pay | Admitting: Urology

## 2018-04-16 DIAGNOSIS — N3281 Overactive bladder: Principal | ICD-10-CM

## 2018-04-16 NOTE — Progress Notes (Signed)
Ptns#4 today

## 2018-04-16 NOTE — Telephone Encounter (Signed)
LVM for pt stating there MD not in clinic on fridays at [facility], she is only in clinic on mondays.

## 2018-04-16 NOTE — Telephone Encounter (Signed)
Patient would like to know if there are PTNS appointments available with Dr. Cecilie Lowers on 03/13 in [facility]. Thank you.

## 2018-04-16 NOTE — Telephone Encounter (Signed)
Fwd msg to MD

## 2018-04-16 NOTE — Telephone Encounter (Signed)
Pt called in requesting callback in regards to ptns injections. She's requesting to speak with MD regarding ptns procedure. Please call patient back at [phone]

## 2018-04-16 NOTE — Interdisciplinary (Signed)
Office Visit and PTNS Tx #4. Pt positioned sitting upright with bilateral extremities elevated on exam table. Area of needle placement and ground placement cleansed with alcohol wipe. 0.22 X 40G sterile needle electrode placed at a point 3 fingerbreadths above and posterior to the medial malleolous, ground placed to sole of right foot and Neuromodulation system attached to the placed needle electrode. PTNS Tx begun. Pt verbalized positive sensation to the toes of left foot at Level 9. Pt left in room for 30 minutes per protocol.    After 30 minutes Neuromodulation system detached from placed needle, ground removed and needle removed. Pt tolerated PTNS Tx well. Appt has been made for next PTNS Tx. Advised to call with any questions or concerns. Pt verbalized understanding.

## 2018-04-16 NOTE — Telephone Encounter (Signed)
Patient called in to advice she woke up late and she has appointment today for PTNS at 9am. She Wants to know if she can come in at 10am. called offoce and per Judeth Cornfield yes per Gigi Gin okay for her to come in today 04/16/2018 @ 10am.   adviced patient and she is aware. Thank you

## 2018-04-17 NOTE — Telephone Encounter (Signed)
Called patient and answered her questions regarding PTNS.

## 2018-04-18 ENCOUNTER — Other Ambulatory Visit: Payer: Self-pay | Admitting: Psychiatry

## 2018-04-22 ENCOUNTER — Telehealth: Payer: Self-pay | Admitting: Urology

## 2018-04-22 NOTE — Telephone Encounter (Signed)
Pt called advising that she needed to cancel upcoming appt for ptns. No further action is needed at this time.

## 2018-04-22 NOTE — Telephone Encounter (Signed)
Appointment canceled.

## 2018-04-23 NOTE — Telephone Encounter (Signed)
Per Dr. Cecile Hearing: "Patient improving on PTNS. No new medication needed. Thanks!"

## 2018-04-26 ENCOUNTER — Ambulatory Visit: Payer: Medicare Other | Admitting: Urology

## 2018-04-29 ENCOUNTER — Ambulatory Visit: Payer: Medicare Other | Admitting: Urology

## 2018-05-01 ENCOUNTER — Ambulatory Visit: Payer: Medicare Other | Admitting: Internal Medicine

## 2018-05-02 ENCOUNTER — Telehealth: Payer: Self-pay | Admitting: Urology

## 2018-05-02 NOTE — Telephone Encounter (Signed)
Patient states she stopped the PTNS shots about a week ago and wanted Dr. Cecile Hearing to be aware. (No return call needed)

## 2018-05-05 ENCOUNTER — Other Ambulatory Visit: Payer: Self-pay

## 2018-05-06 ENCOUNTER — Telehealth: Payer: Medicare Other | Admitting: Psychiatry

## 2018-05-06 ENCOUNTER — Other Ambulatory Visit: Payer: Self-pay

## 2018-05-06 DIAGNOSIS — F429 Obsessive-compulsive disorder, unspecified: Secondary | ICD-10-CM

## 2018-05-06 DIAGNOSIS — F419 Anxiety disorder, unspecified: Secondary | ICD-10-CM

## 2018-05-06 DIAGNOSIS — F250 Schizoaffective disorder, bipolar type: Secondary | ICD-10-CM

## 2018-05-06 DIAGNOSIS — G4700 Insomnia, unspecified: Secondary | ICD-10-CM

## 2018-05-06 MED ORDER — QUETIAPINE FUMARATE 100 MG OR TABS
100.0000 mg | ORAL_TABLET | Freq: Two times a day (BID) | ORAL | 0 refills | Status: DC
Start: 2018-05-06 — End: 2018-05-16

## 2018-05-06 MED ORDER — LEVOTHYROXINE SODIUM 125 MCG OR TABS
ORAL_TABLET | ORAL | Status: DC
Start: 2018-03-14 — End: 2018-11-19

## 2018-05-06 MED ORDER — SERTRALINE HCL 100 MG OR TABS
100.0000 mg | ORAL_TABLET | Freq: Every day | ORAL | 3 refills | Status: DC
Start: 2018-05-06 — End: 2018-10-14

## 2018-05-06 MED ORDER — ZONISAMIDE 100 MG OR CAPS
100.0000 mg | ORAL_CAPSULE | Freq: Every day | ORAL | 0 refills | Status: DC
Start: 2018-05-06 — End: 2018-05-16

## 2018-05-06 MED ORDER — GABAPENTIN 600 MG OR TABS
1200.0000 mg | ORAL_TABLET | Freq: Every evening | ORAL | 3 refills | Status: DC
Start: 2018-05-06 — End: 2019-05-26

## 2018-05-06 MED ORDER — ZONISAMIDE 100 MG OR CAPS
ORAL_CAPSULE | ORAL | Status: DC
Start: 2018-05-02 — End: 2018-05-06

## 2018-05-06 NOTE — Progress Notes (Signed)
Outpatient Psychiatry Progress Note      Chief Complaint:   Regularly scheduled follow-up appointment    Identifying Information:  Julie Monroe is a 73 year old female who presents today for a follow-up appointment.     Interval History:  Julie Monroe MYC VIDEO VISIT Q   Due to COVID-19 pandemic and a federally declared state of public health emergency, this service is being conducted via Telephone. Patient was unable to access technology for video  -- Pt reports she is in state of [place]  -- Pt provides consent for conducing encounter by telephone    Julie Monroe a72 year oldF with a past psychiatric history of schizoaffective d/o, bipolar type, OCD and unspecified insomnia who presents for follow-up. Pt reports mood, "I'm doing fine", and denies suicidal ideation, intent and plan. Pt denies associated depressive symptoms incl amotivation, anhedonia, decreased interest, decreased interest and hopelessness. Pt reports stable anxiety. Pt denies auditory and visual hallucinations, and does not endorse obvious delusions at this encounter. Pt denies current or recent decreased need for sleep or increased energy > 4 d, hyperverbality, racing thoughts, grandiosity and goal-directed bevavior c/w manic or hypomanic episode at this encounter. Pt reports stably improved quality of sleep, and reports regular sleep schedule. Pt reports compliance with psychotropic medications as directed, and denies AEs. Discussed indication for zonisamide. Pt reports zonisamide started for mood stabilization, and denies h/o seizures. Pt reports zonisamide started with bupropion, given outside provider concern for lowered seizure threshold. Pt reports strong preference for continuing zonisamide (which pt reports not having discontinued after last encounter) due to attribution of improved on medication, after discussing risks, benefits and alternate treatments, which the patient appeared to understand. Discussed polypharmacy, and pt  reports she is amenable to tapering quetiapine, after discussing risks, benefits and alternate treatments, which the patient appeared to understand. Pt reports COVID-19 associated quarantining has not been major stressor as, pt states, "Julie Monroe and I love each other and enjoy each other's company".     Review of Systems:  Denies fever, chills, chest pain, dyspnea, dizziness, syncope, recent falls, headaches, nausea, and vomiting.    Current Outpatient Medications:    Current Outpatient Medications:   .  ARIPiprazole (ABILIFY) 30 MG tablet, Take 1 tablet (30 mg) by mouth daily., Disp: 90 tablet, Rfl: 2  .  aspirin 81 MG tablet, Take 81 mg by mouth daily., Disp: , Rfl:   .  BIOTIN PO, Take 1 capsule by mouth daily., Disp: , Rfl:   .  Calcium Carbonate-Vit D-Min (CALCIUM 1200 PO), Take 1 tablet by mouth daily., Disp: , Rfl:   .  Cholecalciferol (VITAMIN D3) 50 MCG (2000 UT) capsule, Take 1 capsule by mouth daily., Disp: , Rfl:   .  Clobetasol Propionate (IMPOYZ) 0.025 % CREA, Apply topically., Disp: , Rfl:   .  desvenlafaxine (PRISTIQ) 50 MG TB24, Take 1 tablet (50 mg) by mouth daily., Disp: 90 tablet, Rfl: 3  .  fluPHENAZine (PROLIXIN) 5 MG tablet, Take 1 tablet (5 mg) by mouth daily., Disp: 90 tablet, Rfl: 0  .  gabapentin (NEURONTIN) 600 MG tablet, Take 2 tablets (1,200 mg) by mouth at bedtime., Disp: 180 tablet, Rfl: 3  .  levothyroxine (SYNTHROID) 112 MCG tablet, Take 112 mcg by mouth daily., Disp: , Rfl: 0  .  omega-3 fatty acids, OTC, 1000 MG CAPS, Take 1 g by mouth daily (with food)., Disp: , Rfl:   .  oxybutynin (DITROPAN XL) 10 MG Controlled-Release tablet, TAKE 1 TABLET BY  MOUTH EVERY DAY, Disp: 90 tablet, Rfl: 3  .  Psyllium (METAMUCIL FIBER PO), Take 1 capsule by mouth daily., Disp: , Rfl:   .  QUEtiapine (SEROQUEL) 300 MG tablet, Take 1 tablet (300 mg) by mouth nightly., Disp: 30 tablet, Rfl: 3  .  Red Yeast Rice Extract (RED YEAST RICE PO), Take 1 tablet by mouth daily., Disp: , Rfl:   .  sertraline  (ZOLOFT) 100 MG tablet, Take 1 tablet (100 mg) by mouth daily., Disp: 30 tablet, Rfl: 3  .  trihexyphenidyl (ARTANE) 1 MG tablet, Take 1 each (1 mg) by mouth 2 times daily., Disp: 60 tablet, Rfl: 3  .  trihexyphenidyl (ARTANE) 2 MG tablet, TAKE 1/2 TABLET BY MOUTH TWICE DAILY, Disp: 90 tablet, Rfl: 2  .  vitamin D3 (VITAMIN D3) 1000 units capsule, Take 1,000 Units by mouth daily., Disp: , Rfl:   .  ziprasidone (GEODON) 40 MG capsule, Take 2 capsules (80 mg) by mouth 2 times daily (with meals)., Disp: 60 capsule, Rfl: 2    Vital Signs:  LMP  (LMP Unknown)     Mental Status Exam:    Appearance: Unable to assess  Behavior: Cooperative. Calm.  Gait: Unable to assess  Muscle Strength/Tone:Unable to assess  Speech: Normal rate, tone and volume  Mood: "I'm doing fine"  Affect:Unable to assess  Thought Process: Linear, goal-directed   - Associations: Intact  Thought Content: No suicidal or homicidal ideation. No auditory or visual hallucinations.  Attention Span and Concentration: Not distracted in conversation  Language: Able to name objects  Fund of Knowledge: Average  Memory: Both recent and remote memories are intact in context of conversation  Orientation: Intact to person, place  Insight/Judgment:Improved    LABS/ OTHER STUDIES    CHEM 7  No results found for: NA, K, CL, CO2, BUN, CR, GLU, [place], MG  LFT's  No results found for: AST, ALT, ALP, ALB, TP, TBIL  CBC  No results found for: WBC, HGB, HCT, PLT  LIPID PANEL  No results found for: CHOL, LDLD, HDL, TRIG  HGBA1c  No components found for: A1C;3  Miscellaneous   No results found for: TSH, FT4     Scales:   No flowsheet data found.  No flowsheet data found.    AIMS (02/07/18):  Facial & Oral Movements: 6  Extremity movements: 3  Trunk Movement: 0  Global Judgment: 1  Total: 10    AIMS (02/20/17):  Facial & Oral Movements: 0  Extremity movements: 2  Trunk Movement: 0  Global Judgment: 1  Total: 3    Assessment:   Julie Monroe a72 year oldF with a  past psychiatric history of schizoaffective d/o, bipolar type, OCD and unspecified insomnia who presents for follow-up.Pt demonstrates chronic, stable psychotic delusion, with some evidence of cognitive decline, thought process disorganization and (previously observed) affective blunting.Pt reports stably improved mood and associated depressive symptoms. Plan to maintain sertraline at current dosage, as pt reports previous, long-term mood stability on dual antidepressants. Plan to taper and discontinue quetiapine in efforts to reduce polypharmacy. Note, previous plan to discontinue zonisamide given unclear benefit for mood stabilization not undertaken (though may address at future encounter).     She is assessed to be at alow acute risk of suicide.   Clinical Global Impression - Severity:4= Moderately ill  :Suggested Guidelines= Overt symptomatology causing noticeable, but modest, functional impairment. .    Diagnosis/Diagnoses:   No diagnosis found.    Plan:  Medication Management: Risks,  benefits, and alternatives of medications were discussed today with Julie Julie and she consents to regimen.   Continue fluphenazine 2.5 mgPOBID   Continue aripiprazole 30 mgPOqDaily   Continue ziprasidone 80 mgPOqDaily   Decrease to quetiapine 200 mg PO QHS x 1 w  ? Then, decrease to quetiapine 100 mg PO QHS x 1 w  ? Then, discontinue, as tolerated   Continuetrihedyphenidyl(Artane)1 mg POBIDfor EPS   Continue gabapentin 1200 mgPO QHS forinsomnia   Continue desvenlafaxine 50 mgqDaily   Continuesertraline 100 mg qDaily  ? Note: Depression worsened after recently stopping medication   Continue zonisamide 100 mg PO qDaily for mood stabilization (off-label)  ? Consider discontinuing at future encounter    In-session psychotherapy:  I spent 16 minutes providing psychotherapy for this encounter.  Treatment modality(ies) employed: Psychoeducation and supportive  Progress to date: Improving  -- Discussed  COVID-19 associated quarantine, and impact on livelihood and daily activities  -- Discussed financial stressor of recently increased medication prices, and offered alternatives to seeking better coverage in addition to [employer] coupon  -- Psychoeducation regarding current regimen, esp sleep aids, and AEs of antipsychotics incl weight gain  -- Psychoeducation regarding current regimen, and possible future plans to reduce polypharmacy to meet pt therapeutic goals to reduce weight gain and sedation  -- F/u SMART goals  -- Walk of any length at least one per week  -- Go to gym of any length at least once per week  -- Extensive psychoeducation re clozapine and cognitive effects of anticholinergics discussed  Treatment plan: Continue psychoeducation and supportive  Level of Care: Pt currently does not meet criteria for inpatient psychiatric hospitalization.    Note Author:  Elayne Guerin, MD  Resident/Fellow Physician

## 2018-05-08 ENCOUNTER — Ambulatory Visit: Payer: Medicare Other | Admitting: Psychiatry

## 2018-05-13 NOTE — Progress Notes (Signed)
I was present with the resident during the history and exam.  I discussed the case with Andrew Chunkil [place], MD and agree with the findings and plan as documented by the resident.  I provided supervision for supportive and other psychotherapy modalities employed to augment medication management. My additions or revisions are included in the record. A brief summary of the encounter follows.    Due to COVID-19 pandemic and a federally declared state of public health emergency, this service is being conducted via video.    At this time patient is at low acute risk for danger to self.  Patient to follow in Clinic, unless noted otherwise.    Enslee Bibbins M. Indica Marcott, D.O.

## 2018-05-16 ENCOUNTER — Telehealth: Payer: Medicare Other | Admitting: Psychiatry

## 2018-05-16 DIAGNOSIS — G4700 Insomnia, unspecified: Secondary | ICD-10-CM

## 2018-05-16 DIAGNOSIS — F250 Schizoaffective disorder, bipolar type: Secondary | ICD-10-CM

## 2018-05-16 DIAGNOSIS — F419 Anxiety disorder, unspecified: Secondary | ICD-10-CM

## 2018-05-16 MED ORDER — ZONISAMIDE 100 MG OR CAPS
200.0000 mg | ORAL_CAPSULE | Freq: Two times a day (BID) | ORAL | 0 refills | Status: DC
Start: 2018-05-16 — End: 2018-09-17

## 2018-05-16 MED ORDER — QUETIAPINE FUMARATE 300 MG OR TABS
300.0000 mg | ORAL_TABLET | Freq: Every evening | ORAL | 1 refills | Status: DC
Start: 2018-05-16 — End: 2018-06-25

## 2018-05-16 NOTE — Progress Notes (Signed)
Attending Note:     Subjective:  I reviewed the history.  Patient interviewed and examined with the resident over the phone/video.     Review of Systems (ROS): As per the resident's note.  Past Medical, Family, Social History:  As per the resident's  note.     Objective:   I have examined the patient and I concur with the resident's exam.     Assessment and plan reviewed with the resident physician.  I agree with the resident's plan as documented.     See the resident's note for further details.     Brock Mokry MD

## 2018-05-16 NOTE — Progress Notes (Signed)
Outpatient Psychiatry Progress Note      Chief Complaint:   Regularly scheduled follow-up appointment    Identifying Information:  Julie Monroe is a 73 year old female who presents today for a follow-up appointment.     Interval History:  Due to COVID-19 pandemic and a federally declared state of public health emergency, this service is being conducted via telephone. Patient was unable to access technology for video.    The patient confirmed to be in the [place] of [place] presently.   The patient gave consent to proceed with the video/telephone visit today.     Julie Disheris a72 year oldF with a past psychiatric history of schizoaffective d/o, bipolar type, OCD and unspecified insomnia who presents for follow-up. Pt reports mood, "I'm well", and denies suicidal ideation, intent and plan. Pt denies associated depressive symptoms incl amotivation, anhedonia, decreased interest, decreased interest and hopelessness. Pt reports stable anxiety. Pt denies auditory and visual hallucinations, and does not endorse obvious delusions at this encounter. Pt denies current or recent decreased need for sleep or increased energy > 4 d, hyperverbality, racing thoughts, grandiosity and goal-directed bevavior c/w manic or hypomanic episode at this encounter. Pt reports trial'ing downtitration of quetiapine (as directed), and reports rebound insomnia. Pt reports retitrating to quetiapine 300 mg PO QHS, with improved sleep, now reporting satisfaction with sleep quality and schedule. Pt reports preference for not making medication changes at this time, given, "Everything that's going on [with COVID-19]". Discussed possible opportunities for reducing polpharmacy incl consolidating sertraline and desvenlafaxine, to which pt reports she is amenable to considering in the future. Pt confirms zonisamide 200 mg PO BID as stable dosage "for years". Discussed indication for zonisamide. Pt reports again that zonisamide started for  mood stabilization, and denies h/o seizures. Pt reports zonisamide started with bupropion, given outside provider concern for lowered seizure threshold. Pt reports strong preference for continuing zonisamide (which pt reports not having discontinued after last encounter) due to attribution of improved on medication, after discussing risks, benefits and alternate treatments, which the patient appeared to understand.    Review of Systems:  Denies fever, chills, chest pain, dyspnea, dizziness, syncope, recent falls, headaches, nausea, and vomiting.    Current Outpatient Medications:    Current Outpatient Medications:   .  ARIPiprazole (ABILIFY) 30 MG tablet, Take 1 tablet (30 mg) by mouth daily., Disp: 90 tablet, Rfl: 2  .  aspirin 81 MG tablet, Take 81 mg by mouth daily., Disp: , Rfl:   .  BIOTIN PO, Take 1 capsule by mouth daily., Disp: , Rfl:   .  Calcium Carbonate-Vit D-Min (CALCIUM 1200 PO), Take 1 tablet by mouth daily., Disp: , Rfl:   .  Cholecalciferol (VITAMIN D3) 50 MCG (2000 UT) capsule, Take 1 capsule by mouth daily., Disp: , Rfl:   .  Clobetasol Propionate (IMPOYZ) 0.025 % CREA, Apply topically., Disp: , Rfl:   .  desvenlafaxine (PRISTIQ) 50 MG TB24, Take 1 tablet (50 mg) by mouth daily., Disp: 90 tablet, Rfl: 3  .  fluPHENAZine (PROLIXIN) 5 MG tablet, Take 1 tablet (5 mg) by mouth daily., Disp: 90 tablet, Rfl: 0  .  gabapentin (NEURONTIN) 600 MG tablet, Take 2 tablets (1,200 mg) by mouth at bedtime., Disp: 180 tablet, Rfl: 3  .  levothyroxine (SYNTHROID) 112 MCG tablet, Take 112 mcg by mouth daily., Disp: , Rfl: 0  .  levothyroxine (SYNTHROID) 125 MCG tablet, TK 1 T PO QAM, Disp: , Rfl:   .  omega-3  fatty acids, OTC, 1000 MG CAPS, Take 1 g by mouth daily (with food)., Disp: , Rfl:   .  oxybutynin (DITROPAN XL) 10 MG Controlled-Release tablet, TAKE 1 TABLET BY MOUTH EVERY DAY, Disp: 90 tablet, Rfl: 3  .  Psyllium (METAMUCIL FIBER PO), Take 1 capsule by mouth daily., Disp: , Rfl:   .  QUEtiapine (SEROQUEL)  100 MG tablet, Take 1 tablet (100 mg) by mouth 2 times daily., Disp: 60 tablet, Rfl: 0  .  QUEtiapine (SEROQUEL) 300 MG tablet, Take 1 tablet (300 mg) by mouth nightly., Disp: 30 tablet, Rfl: 3  .  Red Yeast Rice Extract (RED YEAST RICE PO), Take 1 tablet by mouth daily., Disp: , Rfl:   .  sertraline (ZOLOFT) 100 MG tablet, Take 1 tablet (100 mg) by mouth daily., Disp: 90 tablet, Rfl: 3  .  trihexyphenidyl (ARTANE) 1 MG tablet, Take 1 each (1 mg) by mouth 2 times daily., Disp: 60 tablet, Rfl: 3  .  trihexyphenidyl (ARTANE) 2 MG tablet, TAKE 1/2 TABLET BY MOUTH TWICE DAILY, Disp: 90 tablet, Rfl: 2  .  vitamin D3 (VITAMIN D3) 1000 units capsule, Take 1,000 Units by mouth daily., Disp: , Rfl:   .  ziprasidone (GEODON) 40 MG capsule, Take 2 capsules (80 mg) by mouth 2 times daily (with meals)., Disp: 60 capsule, Rfl: 2  .  zonisamide (ZONEGRAN) 100 MG capsule, Take 1 capsule (100 mg) by mouth daily., Disp: 90 capsule, Rfl: 0    Vital Signs:  LMP  (LMP Unknown)     Mental Status Exam:    Appearance: Unable to assess  Behavior: Cooperative. Calm.  Gait: Unable to assess  Muscle Strength/Tone:Unable to assess  Speech: Normal rate, tone and volume  Mood: "I'm well"  Affect:Unable to assess  Thought Process: Linear, goal-directed   - Associations: Intact  Thought Content: No suicidal or homicidal ideation. No auditory or visual hallucinations.  Attention Span and Concentration: Not distracted in conversation  Language: Able to name objects  Fund of Knowledge: Average  Memory: Both recent and remote memories are intact in context of conversation  Orientation: Intact to person, place  Insight/Judgment:Improved    LABS/ OTHER STUDIES    CHEM 7  No results found for: NA, K, CL, CO2, BUN, CR, GLU, [place], MG  LFT's  No results found for: AST, ALT, ALP, ALB, TP, TBIL  CBC  No results found for: WBC, HGB, HCT, PLT  LIPID PANEL  No results found for: CHOL, LDLD, HDL, TRIG  HGBA1c  No components found for: A1C;3  Miscellaneous      No results found for: TSH, FT4     Scales:   No flowsheet data found.  No flowsheet data found.    AIMS (02/07/18):  Facial & Oral Movements: 6  Extremity movements: 3  Trunk Movement: 0  Global Judgment: 1  Total: 10    AIMS (02/20/17):  Facial & Oral Movements: 0  Extremity movements: 2  Trunk Movement: 0  Global Judgment: 1  Total: 3    Assessment:   Daksha Disheris a72 year oldF with a past psychiatric history of schizoaffective d/o, bipolar type, OCD and unspecified insomnia who presents for follow-up.Pt demonstrates chronic, stable psychotic delusion, with some evidence of cognitive decline, thought process disorganization and (previously observed) affective blunting.Pt reports stably improved mood and associated depressive symptoms. Plan to maintain sertraline at current dosage, as pt reports previous, long-term mood stability on dual antidepressants. However, plan to consider consolidating antidepressants at  future encounter (likely after COVID-19 pandemic). Trial'd taper of quetiapine resulted in rebound insomnia unsuccessful, and returned to quetiapine 300 mg PO QHS.    She is assessed to be at alow acute risk of suicide.   Clinical Global Impression - Severity:4= Moderately ill  :Suggested Guidelines= Overt symptomatology causing noticeable, but modest, functional impairment. .    Diagnosis/Diagnoses:     ICD-10-CM ICD-9-CM   1. Schizoaffective disorder, bipolar type (CMS-HCC) F25.0 295.70   2. Anxiety F41.9 300.00   3. Insomnia, unspecified type G47.00 780.52     Plan:  Medication Management: Risks, benefits, and alternatives of medications were discussed today with Wynona Canes and she consents to regimen.   Continue fluphenazine 2.5 mgPOBID   Continue aripiprazole 30 mgPOqDaily   Continue ziprasidone 80 mgPOqDaily   Decrease to quetiapine 300 mg PO QHS   Continuetrihedyphenidyl(Artane)1 mg POBIDfor EPS   Continue gabapentin 1200 mgPO QHS forinsomnia   Continue  desvenlafaxine 50 mgqDaily  ? Consider consodliating to single antidepressant   Continuesertraline 100 mg qDaily  ? Consider consodliating to single antidepressant   Continue zonisamide 100 mg PO qDaily for mood stabilization (off-label)  ? Consider discontinuing at future encounter    In-session psychotherapy:  I spent 0 minutes providing psychotherapy for this encounter.  Treatment modality(ies) employed: Psychoeducation and supportive  Progress to date: Improving  -- Discussed COVID-19 associated quarantine, and impact on livelihood and daily activities  -- Discussed financial stressor of recently increased medication prices, and offered alternatives to seeking better coverage in addition to [employer] coupon  -- Psychoeducation regarding current regimen, esp sleep aids, and AEs of antipsychotics incl weight gain  -- Psychoeducation regarding current regimen, and possible future plans to reduce polypharmacy to meet pt therapeutic goals to reduce weight gain and sedation  -- F/u SMART goals  -- Walk of any length at least one per week  -- Go to gym of any length at least once per week  -- Extensive psychoeducation re clozapine and cognitive effects of anticholinergics discussed  Treatment plan: Continue psychoeducation and supportive  Level of Care: Pt currently does not meet criteria for inpatient psychiatric hospitalization.    Note Author:  Elayne Guerin, MD  Resident/Fellow Physician

## 2018-05-21 ENCOUNTER — Telehealth: Payer: Self-pay | Admitting: Urology

## 2018-05-21 NOTE — Telephone Encounter (Signed)
Per MD ok to stop oxybuytin if not helping.  Pt would like to discuss further with MD appt made for Monday 4/13 at 10am.

## 2018-05-21 NOTE — Telephone Encounter (Signed)
Patient would like to find out if she needs to continue taking Oxybutinin. Please contact patient for further assistance. Thank you.

## 2018-05-27 ENCOUNTER — Telehealth (INDEPENDENT_AMBULATORY_CARE_PROVIDER_SITE_OTHER): Payer: Medicare Other | Admitting: Urology

## 2018-05-27 ENCOUNTER — Encounter: Payer: Self-pay | Admitting: Urology

## 2018-05-27 ENCOUNTER — Ambulatory Visit: Payer: Medicare Other | Admitting: Urology

## 2018-05-27 DIAGNOSIS — N3941 Urge incontinence: Secondary | ICD-10-CM

## 2018-05-27 NOTE — Progress Notes (Signed)
Urology Telemedicine Progress Note    Due to COVID-19 pandemic and a federally declared state of public health emergency, this service is being conducted via telephone.  Patient consented to proceeding with telemedicine visit today. Total time spent was 11-20 minutes. The telemedicine visit was conducted with Audio Only        HISTORY OF PRESENT ILLNESS:  Ms. Julie Monroe is a 73 year old-year-old woman for follow up. She has a history of urgency urinary incontinence. Cystoscopy on 01/28/18 demonstrated no lesions. She has been treated with oxybutynin and with PTNS x 4 sessions.    Today she reports she was doing well for some time after the PTNS, but she stopped the treatments because she did not feel it was helping. Then her symptoms started to worsen again with urgency and urgency incontinence. She is still taking the oxybutynin and does feel that it helps.      Current Outpatient Medications:   .  ARIPiprazole (ABILIFY) 30 MG tablet, Take 1 tablet (30 mg) by mouth daily., Disp: 90 tablet, Rfl: 2  .  aspirin 81 MG tablet, Take 81 mg by mouth daily., Disp: , Rfl:   .  BIOTIN PO, Take 1 capsule by mouth daily., Disp: , Rfl:   .  Calcium Carbonate-Vit D-Min (CALCIUM 1200 PO), Take 1 tablet by mouth daily., Disp: , Rfl:   .  Cholecalciferol (VITAMIN D3) 50 MCG (2000 UT) capsule, Take 1 capsule by mouth daily., Disp: , Rfl:   .  Clobetasol Propionate (IMPOYZ) 0.025 % CREA, Apply topically., Disp: , Rfl:   .  desvenlafaxine (PRISTIQ) 50 MG TB24, Take 1 tablet (50 mg) by mouth daily., Disp: 90 tablet, Rfl: 3  .  fluPHENAZine (PROLIXIN) 5 MG tablet, Take 1 tablet (5 mg) by mouth daily., Disp: 90 tablet, Rfl: 0  .  gabapentin (NEURONTIN) 600 MG tablet, Take 2 tablets (1,200 mg) by mouth at bedtime., Disp: 180 tablet, Rfl: 3  .  levothyroxine (SYNTHROID) 112 MCG tablet, Take 112 mcg by mouth daily., Disp: , Rfl: 0  .  levothyroxine (SYNTHROID) 125 MCG tablet, TK 1 T PO QAM, Disp: , Rfl:   .  omega-3 fatty acids, OTC, 1000 MG  CAPS, Take 1 g by mouth daily (with food)., Disp: , Rfl:   .  oxybutynin (DITROPAN XL) 10 MG Controlled-Release tablet, TAKE 1 TABLET BY MOUTH EVERY DAY, Disp: 90 tablet, Rfl: 3  .  Psyllium (METAMUCIL FIBER PO), Take 1 capsule by mouth daily., Disp: , Rfl:   .  QUEtiapine (SEROQUEL) 300 MG tablet, Take 1 tablet (300 mg) by mouth nightly., Disp: 90 tablet, Rfl: 1  .  Red Yeast Rice Extract (RED YEAST RICE PO), Take 1 tablet by mouth daily., Disp: , Rfl:   .  sertraline (ZOLOFT) 100 MG tablet, Take 1 tablet (100 mg) by mouth daily., Disp: 90 tablet, Rfl: 3  .  trihexyphenidyl (ARTANE) 1 MG tablet, Take 1 each (1 mg) by mouth 2 times daily., Disp: 60 tablet, Rfl: 3  .  trihexyphenidyl (ARTANE) 2 MG tablet, TAKE 1/2 TABLET BY MOUTH TWICE DAILY, Disp: 90 tablet, Rfl: 2  .  vitamin D3 (VITAMIN D3) 1000 units capsule, Take 1,000 Units by mouth daily., Disp: , Rfl:   .  ziprasidone (GEODON) 40 MG capsule, Take 2 capsules (80 mg) by mouth 2 times daily (with meals)., Disp: 60 capsule, Rfl: 2  .  zonisamide (ZONEGRAN) 100 MG capsule, Take 2 capsules (200 mg) by mouth 2 times daily., Disp:  360 capsule, Rfl: 0    Past Medical History:   Diagnosis Date   . Bipolar affect, depressed (CMS-HCC)    . Chronic back pain    . Hypothyroidism    . Insomnia    . OAB (overactive bladder)    . OCD (obsessive compulsive disorder)    . Osteopenia    . Schizophrenia (CMS-HCC)        No Known Allergies    ASSESSMENT:  Ms. Julie Monroe is a 73 year old woman with urgency urinary incontinence. She improved after PTNS x 4 but now symptoms have returned.      PLAN:  Discussed options with patient including observation, continuing medications, or third line therapy. We discussed options for third line therapy today, but due to shelter at home orders she will not be receiving any of these treatments at this time. She would like to continue with oxybutynin for now and will follow up in several months after COVID precautions cleared to see if she would like  to continue with PTNS.

## 2018-05-28 ENCOUNTER — Telehealth: Payer: Self-pay

## 2018-05-28 NOTE — Telephone Encounter (Signed)
I called the patient to schedule her appointment in 2 months with Dr. Cecilie Lowers. Patient stated that she will call back. Ok to schedule.

## 2018-05-29 ENCOUNTER — Telehealth: Payer: Medicare Other | Admitting: Psychiatry

## 2018-05-30 ENCOUNTER — Telehealth (INDEPENDENT_AMBULATORY_CARE_PROVIDER_SITE_OTHER): Payer: Medicare Other | Admitting: Psychiatry

## 2018-05-30 DIAGNOSIS — F419 Anxiety disorder, unspecified: Secondary | ICD-10-CM

## 2018-05-30 DIAGNOSIS — F250 Schizoaffective disorder, bipolar type: Secondary | ICD-10-CM

## 2018-05-30 DIAGNOSIS — G4700 Insomnia, unspecified: Secondary | ICD-10-CM

## 2018-05-30 NOTE — Progress Notes (Signed)
Outpatient Psychiatry Progress Note      Chief Complaint:   Regularly scheduled follow-up appointment    Identifying Information:  Julie Monroe is a 73 year old female who presents today for a follow-up appointment.     Interval History:  Due to COVID-19 pandemic and a federally declared state of public health emergency, this telemedicine visit was conducted Audio Only.   Patient confirmed their presence in the state of [place] and has consented to proceeding with telemedicine today. Total time spent was 21+ minutes.     Julie Monroe a72 year oldF with a past psychiatric history of schizoaffective d/o, bipolar type, OCD and unspecified insomnia who presents for follow-up. Pt reports mood, "OK", and denies suicidal ideation, intent and plan. Pt denies associated depressive symptoms incl amotivation, anhedonia, decreased interest, decreased interest and hopelessness. Pt reports stable anxiety. Pt denies auditory and visual hallucinations, and does not endorse obvious delusions at this encounter. Pt denies current or recent decreased need for sleep or increased energy > 4 d, hyperverbality, racing thoughts, grandiosity and goal-directed bevavior c/w manic or hypomanic episode at this encounter. Pt reports stably improved quality of sleep, and reports regular sleep schedule, incl since retitrating to quetiapine 300 mg PO QHS. Pt reports compliance with psychotropic medications as directed, and denies AEs. Pt reports COVID-19 associated quarantining has been a stressor, but that her perception of quarantining and lifestyle changes is improving. Pt reports plans to take a road trip with pt husband, Julie Monroe, while staying primarily in the car, avoiding others and wearing masks and gloves. Discussed polypharmacy, and pt reports she is amenable to tapering quetiapine, after discussing risks, benefits and alternate treatments, which the patient appeared to understand.     Review of Systems:  Denies fever,  chills, chest pain, dyspnea, dizziness, syncope, recent falls, headaches, nausea, and vomiting.    Current Outpatient Medications:    Current Outpatient Medications:   .  ARIPiprazole (ABILIFY) 30 MG tablet, Take 1 tablet (30 mg) by mouth daily., Disp: 90 tablet, Rfl: 2  .  aspirin 81 MG tablet, Take 81 mg by mouth daily., Disp: , Rfl:   .  BIOTIN PO, Take 1 capsule by mouth daily., Disp: , Rfl:   .  Calcium Carbonate-Vit D-Min (CALCIUM 1200 PO), Take 1 tablet by mouth daily., Disp: , Rfl:   .  Cholecalciferol (VITAMIN D3) 50 MCG (2000 UT) capsule, Take 1 capsule by mouth daily., Disp: , Rfl:   .  Clobetasol Propionate (IMPOYZ) 0.025 % CREA, Apply topically., Disp: , Rfl:   .  desvenlafaxine (PRISTIQ) 50 MG TB24, Take 1 tablet (50 mg) by mouth daily., Disp: 90 tablet, Rfl: 3  .  fluPHENAZine (PROLIXIN) 5 MG tablet, Take 1 tablet (5 mg) by mouth daily., Disp: 90 tablet, Rfl: 0  .  gabapentin (NEURONTIN) 600 MG tablet, Take 2 tablets (1,200 mg) by mouth at bedtime., Disp: 180 tablet, Rfl: 3  .  levothyroxine (SYNTHROID) 112 MCG tablet, Take 112 mcg by mouth daily., Disp: , Rfl: 0  .  levothyroxine (SYNTHROID) 125 MCG tablet, TK 1 T PO QAM, Disp: , Rfl:   .  omega-3 fatty acids, OTC, 1000 MG CAPS, Take 1 g by mouth daily (with food)., Disp: , Rfl:   .  oxybutynin (DITROPAN XL) 10 MG Controlled-Release tablet, TAKE 1 TABLET BY MOUTH EVERY DAY, Disp: 90 tablet, Rfl: 3  .  Psyllium (METAMUCIL FIBER PO), Take 1 capsule by mouth daily., Disp: , Rfl:   .  QUEtiapine (  SEROQUEL) 300 MG tablet, Take 1 tablet (300 mg) by mouth nightly., Disp: 90 tablet, Rfl: 1  .  Red Yeast Rice Extract (RED YEAST RICE PO), Take 1 tablet by mouth daily., Disp: , Rfl:   .  sertraline (ZOLOFT) 100 MG tablet, Take 1 tablet (100 mg) by mouth daily., Disp: 90 tablet, Rfl: 3  .  trihexyphenidyl (ARTANE) 1 MG tablet, Take 1 each (1 mg) by mouth 2 times daily., Disp: 60 tablet, Rfl: 3  .  trihexyphenidyl (ARTANE) 2 MG tablet, TAKE 1/2 TABLET BY MOUTH  TWICE DAILY, Disp: 90 tablet, Rfl: 2  .  vitamin D3 (VITAMIN D3) 1000 units capsule, Take 1,000 Units by mouth daily., Disp: , Rfl:   .  ziprasidone (GEODON) 40 MG capsule, Take 2 capsules (80 mg) by mouth 2 times daily (with meals)., Disp: 60 capsule, Rfl: 2  .  zonisamide (ZONEGRAN) 100 MG capsule, Take 2 capsules (200 mg) by mouth 2 times daily., Disp: 360 capsule, Rfl: 0    Vital Signs:  LMP  (LMP Unknown)     Mental Status Exam:    Appearance: Unable to assess  Behavior: Cooperative. Calm.  Gait: Unable to assess  Muscle Strength/Tone:Unable to assess  Speech: Normal rate, tone and volume  Mood: "OK"  Affect:Unable to assess  Thought Process: Linear, goal-directed   - Associations: Intact  Thought Content: No suicidal or homicidal ideation. No auditory or visual hallucinations.  Attention Span and Concentration: Not distracted in conversation  Language: Able to name objects  Fund of Knowledge: Average  Memory: Both recent and remote memories are intact in context of conversation  Orientation: Intact to person, place  Insight/Judgment:Improved    LABS/ OTHER STUDIES    CHEM 7  No results found for: NA, K, CL, CO2, BUN, CR, GLU, [place], MG  LFT's  No results found for: AST, ALT, ALP, ALB, TP, TBIL  CBC  No results found for: WBC, HGB, HCT, PLT  LIPID PANEL  No results found for: CHOL, LDLD, HDL, TRIG  HGBA1c  No components found for: A1C;3  Miscellaneous   No results found for: TSH, FT4     Scales:   No flowsheet data found.  No flowsheet data found.    AIMS (02/07/18):  Facial & Oral Movements: 6  Extremity movements: 3  Trunk Movement: 0  Global Judgment: 1  Total: 10    AIMS (02/20/17):  Facial & Oral Movements: 0  Extremity movements: 2  Trunk Movement: 0  Global Judgment: 1  Total: 3    Assessment:   Julie Monroe a72 year oldF with a past psychiatric history of schizoaffective d/o, bipolar type, OCD and unspecified insomnia who presents for follow-up.Pt demonstrates chronic, stable psychotic  delusion, with some evidence of cognitive decline, thought process disorganization and (previously observed) affective blunting.Pt reports stably improved mood and associated depressive symptoms. Plan to maintain sertraline at current dosage, as pt reports previous, long-term mood stability on dual antidepressants. Plan to taper and discontinue quetiapine in efforts to reduce polypharmacy at future encounters. Note, previous plan to discontinue zonisamide given unclear benefit for mood stabilization not undertaken (though may address at future encounter).     She is assessed to be at alow acute risk of suicide.   Clinical Global Impression - Severity:4= Moderately ill  :Suggested Guidelines= Overt symptomatology causing noticeable, but modest, functional impairment. .    Diagnosis/Diagnoses:     ICD-10-CM ICD-9-CM   1. Schizoaffective disorder, bipolar type (CMS-HCC) F25.0 295.70  2. Obsessive-compulsive disorder, unspecified type F42.9 300.3   3. Anxiety F41.9 300.00       Plan:  Medication Management: Risks, benefits, and alternatives of medications were discussed today with Julie Monroe and she consents to regimen.   Continue fluphenazine 2.5 mgPOBID   Continue aripiprazole 30 mgPOqDaily   Continue ziprasidone 80 mgPOqDaily   Continue quetiapine 300 mg PO QHS  ? Note: Insomnia worsened after recently stopping medication   Continuetrihedyphenidyl(Artane)1 mg POBIDfor EPS   Continue gabapentin 1200 mgPO QHS forinsomnia   Continue desvenlafaxine 50 mgqDaily   Continuesertraline 100 mg qDaily  ? Note: Depression worsened after recently stopping medication   Continue zonisamide 100 mg PO qDaily for mood stabilization (off-label)    In-session psychotherapy:  I spent 17 minutes providing psychotherapy for this encounter.  Treatment modality(ies) employed: Psychoeducation and supportive  Progress to date: Improving  -- Discussed changing perception of COVID-19 associated quarantine       Discussed plan for excursion, to approximate previously regular extended trips  -- Discussed COVID-19 associated quarantine, and impact on livelihood and daily activities  -- Discussed financial stressor of recently increased medication prices, and offered alternatives to seeking better coverage in addition to [employer] coupon  -- Psychoeducation regarding current regimen, esp sleep aids, and AEs of antipsychotics incl weight gain  -- Psychoeducation regarding current regimen, and possible future plans to reduce polypharmacy to meet pt therapeutic goals to reduce weight gain and sedation  -- F/u SMART goals  -- Walk of any length at least one per week  -- Go to gym of any length at least once per week  -- Extensive psychoeducation re clozapine and cognitive effects of anticholinergics discussed  Treatment plan: Continue psychoeducation and supportive  Level of Care: Pt currently does not meet criteria for inpatient psychiatric hospitalization.    Note Author:  Elayne GuerinAndrew Chunkil Billiejo Sorto, MD  Resident/Fellow Physician

## 2018-05-30 NOTE — Progress Notes (Signed)
Attending Note:     Patient interviewed and examined with the resident over the phone/video.     I was present with the trainee during the key portions of the exam.  I participated in the medical decision making and management of the patient. I agree with the findings and plan as documented.  I provided supervision for supportive and other psychotherapy modalities employed. My additions or revisions are included in the record as needed.     Blaze Nylund MD

## 2018-06-07 ENCOUNTER — Telehealth: Payer: Self-pay | Admitting: Internal Medicine

## 2018-06-07 NOTE — Telephone Encounter (Signed)
Patient states that she has a rash that started 2 weeks ago made an appointment for patient for 4/27

## 2018-06-10 ENCOUNTER — Ambulatory Visit: Payer: Medicare Other | Admitting: Internal Medicine

## 2018-06-10 ENCOUNTER — Encounter (HOSPITAL_BASED_OUTPATIENT_CLINIC_OR_DEPARTMENT_OTHER): Payer: Medicare Other | Admitting: Internal Medicine

## 2018-06-10 DIAGNOSIS — R21 Rash and other nonspecific skin eruption: Secondary | ICD-10-CM

## 2018-06-10 DIAGNOSIS — Z0000 Encounter for general adult medical examination without abnormal findings: Secondary | ICD-10-CM

## 2018-06-10 DIAGNOSIS — Z9889 Other specified postprocedural states: Secondary | ICD-10-CM

## 2018-06-10 MED ORDER — ECONAZOLE NITRATE 1 % EX CREA
1.0000 | TOPICAL_CREAM | Freq: Two times a day (BID) | CUTANEOUS | 1 refills | Status: DC
Start: 2018-06-10 — End: 2019-09-16

## 2018-06-10 MED ORDER — ECONAZOLE NITRATE 1 % EX CREA
1.0000 | TOPICAL_CREAM | Freq: Two times a day (BID) | CUTANEOUS | 1 refills | Status: DC
Start: 2018-06-10 — End: 2018-09-17

## 2018-06-10 NOTE — Progress Notes (Signed)
TELEPHONE VISIT       The following medical visit occurred in the form of a telephone call visit instead of an in person face-to-face visit. Consent for telemedicine visit obtained from patient.     1. Can you confirm that you are presently in the [place]? YES  2. Do you consent to proceed with the video visit today?  YES    I called patient by telephone and we discussed the following items in detail:    Subjective:    Rash - has been having rashes on her body for a few weeks. Using [employer] powder when they itch. Rashes started approx 2 weeks ago.  Currently in 3 places.  Initially started on her groin, then right groin, now under her left breast, in her creases.  Rash is red, itchy, is the size of a her baseball.  Has not occurred before.  No new changes in soaps or other products. No changes in laundry detergent. No changes in type of clothing. No frank vesicles, just increase in sweat in that area.     Was seen by a derm for a shave and punch biopsy on her leg 4-6 weeks ago, "turned out to be normal" but at the area of the scab is still present, is not painful, no drainage, no redness. Surrounding skin has been normal.    Labs and dexa ordered previously, are still pending, pt would like to defer for now due to covid situation.      Objective:    No vitals signs and no physical examination done as discussion was not face-to-face.    Rash seen on video in bilat groin and under left breast - is erythematous, non-vesicular, with sharply demarcated borders, surrounding skin nl    Assessment and Plan:    1. Rash - consistent with candidiasis, intertriginous.  Will treat with econazole topical.  Pt also advised to keep areas as clean and dry as possible and avoid prolonged exposure to sweat.    2. S/p skin biopsy - per video call today, lesion looks to be scabbed over without any active drainage, erythema or tenderness.  Advised to monitor and if red flag s/s occur, will f/u with derm who did  the initial biopsy.      3. HCM - will complete labs and dexa when able.    All the above issues were fully discussed with patient during our telephone conversation and patient is aware of all of these issues as outlined above.         Leonia Reader, MD, MPH    Due to COVID-19 pandemic and a federally declared state of public health emergency, this service is being conducted via telephone.  Patient consented to proceeding with telemedicine visit today. Total time spent was 11-20 minutes. The telemedicine visit was conducted with Audio+Video

## 2018-06-10 NOTE — Telephone Encounter (Signed)
Pt had visit with Dr. [name] today.

## 2018-06-10 NOTE — Patient Instructions (Addendum)
Please complete your fasting blood work and bone density test when you get a chance.    Please follow up with your dermatologist if your scab changes.    Here is some information on your skin yeast infection:    Skin Yeast Infection    A skin yeast infection is a condition in which there is an overgrowth of yeast (candida) that normally lives on the skin. This condition usually occurs in areas of the skin that are constantly warm and moist, such as the armpits or the groin.  What are the causes?  This condition is caused by a change in the normal balance of the yeast and bacteria that live on the skin.  What increases the risk?  You are more likely to develop this condition if you:   Are obese.   Are pregnant.   Take birth control pills.   Have diabetes.   Take antibiotic medicines.   Take steroid medicines.   Are malnourished.   Have a weak body defense system (immune system).   Are 71 years of age or older.   Wear tight clothing.  What are the signs or symptoms?  The most common symptom of this condition is itchiness in the affected area. Other symptoms include:   Red, swollen area of the skin.   Bumps on the skin.  How is this diagnosed?   This condition is diagnosed with a medical history and physical exam.   Your health care provider may check for yeast by taking light scrapings of the skin to be viewed under a microscope.  How is this treated?  This condition is treated with medicine. Medicines may be prescribed or be available over the counter. The medicines may be:   Taken by mouth (orally).   Applied as a cream or powder to your skin.  Follow these instructions at home:     Take or apply over-the-counter and prescription medicines only as told by your health care provider.   Maintain a healthy weight. If you need help losing weight, talk with your health care provider.   Keep your skin clean and dry.   If you have diabetes, keep your blood sugar under control.   Keep all follow-up  visits as told by your health care provider. This is important.  Contact a health care provider if:   Your symptoms go away and then return.   Your symptoms do not get better with treatment.   Your symptoms get worse.   Your rash spreads.   You have a fever or chills.   You have new symptoms.   You have new warmth or redness of your skin.  Summary   A skin yeast infection is a condition in which there is an overgrowth of yeast (candida) that normally lives on the skin. This condition is caused by a change in the normal balance of the yeast and bacteria that live on the skin.   Take or apply over-the-counter and prescription medicines only as told by your health care provider.   Keep your skin clean and dry.   Contact a health care provider if your symptoms do not get better with treatment.  This information is not intended to replace advice given to you by your health care provider. Make sure you discuss any questions you have with your health care provider.  Document Released: 10/18/2010 Document Revised: 06/19/2017 Document Reviewed: 06/19/2017  Elsevier Interactive Patient Education  2019 [employer].

## 2018-06-10 NOTE — Progress Notes (Signed)
ERROR

## 2018-06-13 ENCOUNTER — Telehealth: Payer: Self-pay | Admitting: Psychiatry

## 2018-06-13 ENCOUNTER — Other Ambulatory Visit: Payer: Self-pay | Admitting: Psychiatry

## 2018-06-13 NOTE — Telephone Encounter (Signed)
Patient called in requesting RX refill of Prolixin 5mg  as she is running low.

## 2018-06-14 MED ORDER — FLUPHENAZINE HCL 5 MG OR TABS
5.0000 mg | ORAL_TABLET | Freq: Every day | ORAL | 0 refills | Status: DC
Start: 2018-06-14 — End: 2018-09-17

## 2018-06-14 NOTE — Telephone Encounter (Signed)
Spoke to [place] by phone as she left a VM for me (supposed to be for Dr. Willaim Bane) to refill her fluphenazine. Per last note, she was on 2.5 mg BID, but she clarified she is actually taking 5 mg daily. Stable on 5 mg daily. Will refill as Dr. Willaim Bane is out of office this week.

## 2018-06-16 ENCOUNTER — Encounter: Payer: Self-pay | Admitting: Psychiatry

## 2018-06-17 ENCOUNTER — Ambulatory Visit: Payer: Medicare Other | Admitting: Internal Medicine

## 2018-06-25 ENCOUNTER — Telehealth (INDEPENDENT_AMBULATORY_CARE_PROVIDER_SITE_OTHER): Payer: Medicare Other | Admitting: Psychiatry

## 2018-06-25 ENCOUNTER — Encounter: Payer: Medicare Other | Admitting: Psychiatry

## 2018-06-25 ENCOUNTER — Encounter: Payer: Self-pay | Admitting: Psychiatry

## 2018-06-25 DIAGNOSIS — F419 Anxiety disorder, unspecified: Secondary | ICD-10-CM

## 2018-06-25 DIAGNOSIS — G4700 Insomnia, unspecified: Secondary | ICD-10-CM

## 2018-06-25 DIAGNOSIS — F250 Schizoaffective disorder, bipolar type: Secondary | ICD-10-CM

## 2018-06-25 MED ORDER — QUETIAPINE FUMARATE 100 MG OR TABS
100.0000 mg | ORAL_TABLET | Freq: Two times a day (BID) | ORAL | 0 refills | Status: DC
Start: 2018-06-25 — End: 2018-09-17

## 2018-06-25 NOTE — Progress Notes (Signed)
Outpatient Psychiatry Progress Note      Chief Complaint:   Regularly scheduled follow-up appointment    Identifying Information:  Julie Monroe is a 73 year old female who presents today for a follow-up appointment.     Interval History:  Julie Monroe MYC VIDEO VISIT Q:   Is patient in state of [place]?: Yes    Do you consent to proceed with the Video Visit today?: Yes      Julie Monroe a36 year oldF with a past psychiatric history of schizoaffective d/o, bipolar type, OCD and unspecified insomnia who presents for follow-up. Pt reports mood, "Good", and denies suicidal ideation, intent and plan. Pt denies associated depressive symptoms incl amotivation, anhedonia, decreased interest, decreased interest and hopelessness. Pt reports stable anxiety. Pt denies auditory and visual hallucinations, and does not endorse obvious delusions at this encounter. Pt denies current or recent decreased need for sleep or increased energy > 4 d, hyperverbality, racing thoughts, grandiosity and goal-directed bevavior c/w manic or hypomanic episode at this encounter. Pt reports stably improved quality of sleep, and reports regular sleep schedule. Pt reports she is amenable to trial'ing lowered quetiapine (due to concern for weight-gain long-term), and plans to monitor sleep closely (after discussing risks, benefits and alternate treatments, which the patient appeared to understand). Pt reports compliance with psychotropic medications as directed, and denies AEs. Pt reports increased price of fluphenazine is a stressor, but plans on applying (with provider assistance) for tier reduction and/or seeking discounts (as provider shared GoodRx hyperlink). Pt husband reports quarantining has resulted in "strengthened relationship".     Review of Systems:  Denies fever, chills, chest pain, dyspnea, dizziness, syncope, recent falls, headaches, nausea, and vomiting.    Current Outpatient Medications:    Current Outpatient Medications:   .   ARIPiprazole (ABILIFY) 30 MG tablet, Take 1 tablet (30 mg) by mouth daily., Disp: 90 tablet, Rfl: 2  .  aspirin 81 MG tablet, Take 81 mg by mouth daily., Disp: , Rfl:   .  BIOTIN PO, Take 1 capsule by mouth daily., Disp: , Rfl:   .  Calcium Carbonate-Vit D-Min (CALCIUM 1200 PO), Take 1 tablet by mouth daily., Disp: , Rfl:   .  Cholecalciferol (VITAMIN D3) 50 MCG (2000 UT) capsule, Take 1 capsule by mouth daily., Disp: , Rfl:   .  Clobetasol Propionate (IMPOYZ) 0.025 % CREA, Apply topically., Disp: , Rfl:   .  desvenlafaxine (PRISTIQ) 50 MG TB24, Take 1 tablet (50 mg) by mouth daily., Disp: 90 tablet, Rfl: 3  .  econazole (SPECTAZOLE) 1 % cream, Apply 1 Application topically 2 times daily. Use a small amount as directed, Disp: 1 Tube, Rfl: 1  .  econazole (SPECTAZOLE) 1 % cream, Apply 1 Application topically 2 times daily. Use a small amount as directed, Disp: 1 Tube, Rfl: 1  .  fluPHENAZine (PROLIXIN) 5 MG tablet, Take 1 tablet (5 mg) by mouth daily., Disp: 90 tablet, Rfl: 0  .  gabapentin (NEURONTIN) 600 MG tablet, Take 2 tablets (1,200 mg) by mouth at bedtime., Disp: 180 tablet, Rfl: 3  .  levothyroxine (SYNTHROID) 112 MCG tablet, Take 112 mcg by mouth daily., Disp: , Rfl: 0  .  levothyroxine (SYNTHROID) 125 MCG tablet, TK 1 T PO QAM, Disp: , Rfl:   .  omega-3 fatty acids, OTC, 1000 MG CAPS, Take 1 g by mouth daily (with food)., Disp: , Rfl:   .  oxybutynin (DITROPAN XL) 10 MG Controlled-Release tablet, TAKE 1 TABLET BY MOUTH  EVERY DAY, Disp: 90 tablet, Rfl: 3  .  Psyllium (METAMUCIL FIBER PO), Take 1 capsule by mouth daily., Disp: , Rfl:   .  QUEtiapine (SEROQUEL) 300 MG tablet, Take 1 tablet (300 mg) by mouth nightly., Disp: 90 tablet, Rfl: 1  .  Red Yeast Rice Extract (RED YEAST RICE PO), Take 1 tablet by mouth daily., Disp: , Rfl:   .  sertraline (ZOLOFT) 100 MG tablet, Take 1 tablet (100 mg) by mouth daily., Disp: 90 tablet, Rfl: 3  .  trihexyphenidyl (ARTANE) 1 MG tablet, Take 1 each (1 mg) by mouth 2 times  daily., Disp: 60 tablet, Rfl: 3  .  trihexyphenidyl (ARTANE) 2 MG tablet, TAKE 1/2 TABLET BY MOUTH TWICE DAILY, Disp: 90 tablet, Rfl: 2  .  vitamin D3 (VITAMIN D3) 1000 units capsule, Take 1,000 Units by mouth daily., Disp: , Rfl:   .  ziprasidone (GEODON) 40 MG capsule, Take 2 capsules (80 mg) by mouth 2 times daily (with meals)., Disp: 60 capsule, Rfl: 2  .  zonisamide (ZONEGRAN) 100 MG capsule, Take 2 capsules (200 mg) by mouth 2 times daily., Disp: 360 capsule, Rfl: 0    Vital Signs:  LMP  (LMP Unknown)     Mental Status Exam:    Appearance: Unable to assess  Behavior: Cooperative. Calm.  Gait: Unable to assess  Muscle Strength/Tone:Unable to assess  Speech: Normal rate, tone and volume  Mood: "Good"  Affect:Unable to assess  Thought Process: Linear, goal-directed   - Associations: Intact  Thought Content: No suicidal or homicidal ideation. No auditory or visual hallucinations.  Attention Span and Concentration: Not distracted in conversation  Language: Able to name objects  Fund of Knowledge: Average  Memory: Both recent and remote memories are intact in context of conversation  Orientation: Intact to person, place  Insight/Judgment:Improved    LABS/ OTHER STUDIES    CHEM 7  No results found for: NA, K, CL, CO2, BUN, CR, GLU, MG  LFT's  No results found for: AST, ALT, ALP, ALB, TP, TBIL  CBC  No results found for: WBC, HGB, HCT, PLT  LIPID PANEL  No results found for: CHOL, LDLD, HDL, TRIG  HGBA1c  No components found for: A1C;3  Miscellaneous   No results found for: TSH, FT4     Scales:   No flowsheet data found.  No flowsheet data found.    AIMS (02/07/18):  Facial & Oral Movements: 6  Extremity movements: 3  Trunk Movement: 0  Global Judgment: 1  Total: 10    AIMS (02/20/17):  Facial & Oral Movements: 0  Extremity movements: 2  Trunk Movement: 0  Global Judgment: 1  Total: 3    Assessment:   Julie Monroe a72 year oldF with a past psychiatric history of schizoaffective d/o, bipolar  type, OCD and unspecified insomnia who presents for follow-up.Pt demonstrates chronic, stable psychotic delusion, with some evidence of cognitive decline, thought process disorganization and (previously observed) affective blunting.Pt reports stably improved mood, associated depressive symptoms, anxiety and psychotic symptoms (though pt previously endorsed stable psychotic delusion, not elicited at this encounter, in addition to some evidence of cognitive decline, thought process disorganization and affective blunting). Plan to taper and discontinue quetiapine in efforts to reduce polypharmacy, an ongoing therapeutic goal.     She is assessed to be at alow acute risk of suicide.   Clinical Global Impression - Severity:4= Moderately ill  :Suggested Guidelines= Overt symptomatology causing noticeable, but modest, functional impairment. Marland Kitchen  Diagnosis/Diagnoses:     ICD-10-CM ICD-9-CM   1. Schizoaffective disorder, bipolar type (CMS-HCC) F25.0 295.70   2. Anxiety F41.9 300.00   3. Insomnia, unspecified type G47.00 780.52       Plan:  Medication Management: Risks, benefits, and alternatives of medications were discussed today with Julie Monroe and she consents to regimen.   Decrease to quetiapine 200 mg PO QHS, from 300 mg PO QHS   Continue fluphenazine 2.5 mgPOBID   Continue aripiprazole 30 mgPOqDaily   Continue ziprasidone 80 mgPOqDaily   Continuetrihedyphenidyl(Artane)1 mg POBIDfor EPS   Continue gabapentin 1200 mgPO QHS forinsomnia   Continue desvenlafaxine 50 mgqDaily   Continuesertraline 100 mg qDaily  ? Note: Depression worsened after recently stopping medication   Continue zonisamide 100 mg PO qDaily for mood stabilization (off-label)    In-session psychotherapy:  I spent 21 minutes providing psychotherapy for this encounter.  Treatment modality(ies) employed: Psychoeducation and supportive  Progress to date: Improving  -- Discussed COVID-19 related lifestyle changes, and, in this case,  positive impact on relationship w/ pt husband  -- Discussed possible actions to take in response to increased fluphenazine pricing (described above)  -- Discussed changing perception of COVID-19 associated quarantine      Discussed plan for excursion, to approximate previously regular extended trips  -- Discussed COVID-19 associated quarantine, and impact on livelihood and daily activities  -- Discussed financial stressor of recently increased medication prices, and offered alternatives to seeking better coverage in addition to [employer] coupon  -- Psychoeducation regarding current regimen, esp sleep aids, and AEs of antipsychotics incl weight gain  -- Psychoeducation regarding current regimen, and possible future plans to reduce polypharmacy to meet pt therapeutic goals to reduce weight gain and sedation  -- F/u SMART goals  -- Walk of any length at least one per week  -- Go to gym of any length at least once per week  -- Extensive psychoeducation re clozapine and cognitive effects of anticholinergics discussed  Treatment plan: Continue psychoeducation and supportive  Level of Care: Pt currently does not meet criteria for inpatient psychiatric hospitalization.    Note Author:  Elayne Guerin, MD  Resident/Fellow Physician

## 2018-06-26 NOTE — Progress Notes (Signed)
I was present with the resident during the critical portion of the history and exam.  We discussed the case together and agree with the findings and plan as documented by the resident.  I provided supervision for supportive and other psychotherapy modalities employed to augment medication management. My additions or revisions are included in the record as needed.    Jauna Raczynski, M.D.  Attending Physician

## 2018-06-28 ENCOUNTER — Telehealth: Payer: Self-pay | Admitting: Urology

## 2018-06-28 NOTE — Telephone Encounter (Signed)
Patient would like to talk to someone in regards to her medication of oxybutynin. She states she's been taking this medication and thinks she doesn't need to take it anymore     Please contact and further assist patient  Thank you

## 2018-07-02 NOTE — Telephone Encounter (Signed)
Mychart msg sent

## 2018-07-03 ENCOUNTER — Encounter: Payer: Self-pay | Admitting: Psychiatry

## 2018-07-03 ENCOUNTER — Telehealth: Payer: Self-pay | Admitting: Psychiatry

## 2018-07-03 NOTE — Telephone Encounter (Addendum)
Patient and husband called in for an urgent call back regarding Prolixin medication.       07/05/18  As noted in previous documentation, tier reduction application (with reference number noted) filed with [facility] and pt and pt husband updated.

## 2018-07-04 ENCOUNTER — Encounter: Payer: Self-pay | Admitting: Psychiatry

## 2018-07-09 ENCOUNTER — Other Ambulatory Visit: Payer: Self-pay | Admitting: Internal Medicine

## 2018-07-09 ENCOUNTER — Ambulatory Visit: Payer: Medicare Other | Admitting: Psychiatry

## 2018-07-10 ENCOUNTER — Telehealth: Payer: Self-pay | Admitting: Internal Medicine

## 2018-07-10 ENCOUNTER — Ambulatory Visit (INDEPENDENT_AMBULATORY_CARE_PROVIDER_SITE_OTHER): Payer: Medicare Other | Admitting: Psychiatry

## 2018-07-10 DIAGNOSIS — F429 Obsessive-compulsive disorder, unspecified: Secondary | ICD-10-CM

## 2018-07-10 DIAGNOSIS — G259 Extrapyramidal and movement disorder, unspecified: Secondary | ICD-10-CM

## 2018-07-10 DIAGNOSIS — F419 Anxiety disorder, unspecified: Secondary | ICD-10-CM

## 2018-07-10 DIAGNOSIS — G4700 Insomnia, unspecified: Secondary | ICD-10-CM

## 2018-07-10 DIAGNOSIS — F250 Schizoaffective disorder, bipolar type: Secondary | ICD-10-CM

## 2018-07-10 LAB — CBC WITH DIFF, BLOOD
Abs Basophils: 18 cells/uL (ref 0–200)
Abs Eosinophils: 189 cells/uL (ref 15–500)
Abs Lymphs: 1398 cells/uL (ref 850–3900)
Abs Monocytes: 359 cells/uL (ref 200–950)
Abs NRBC: 0 cells/uL
Abs Neutrophils: 2636 cells/uL (ref 1500–7800)
Basophils: 0.4 %
Eosinophils: 4.1 %
HCT: 40.4 % (ref 35.0–45.0)
HGB: 13.5 g/dL (ref 11.7–15.5)
Lymps: 30.4 %
MCH: 29.9 pg (ref 27.0–33.0)
MCHC: 33.4 g/dL (ref 32.0–36.0)
MCV: 89.6 fL (ref 80.0–100.0)
MPV: 11.7 fL (ref 7.5–12.5)
Monocytes: 7.8 %
PLT: 182 10*3/uL (ref 140–400)
RBC: 4.51 10*6/uL (ref 3.80–5.10)
RDW: 13 % (ref 11.0–15.0)
SEGS: 57.3 %
WBC: 4.6 10*3/uL (ref 3.8–10.8)

## 2018-07-10 LAB — COMPREHENSIVE METABOLIC PANEL, BLOOD
ALT (SGPT): 9 U/L (ref 6–29)
AST (SGOT): 14 U/L (ref 10–35)
Albumin/Glob Ratio: 1.5 (calc) (ref 1.0–2.5)
Albumin: 4.2 g/dL (ref 3.6–5.1)
Alkaline Phos: 92 U/L (ref 37–153)
BUN/Creatinine Ratio: 33 (calc) — ABNORMAL HIGH (ref 6–22)
BUN: 26 mg/dL — ABNORMAL HIGH (ref 7–25)
Bilirubin, Total: 0.7 mg/dL (ref 0.2–1.2)
Calcium: 9.8 mg/dL (ref 8.6–10.4)
Carbon Dioxide: 30 mmol/L (ref 20–32)
Chloride: 105 mmol/L (ref 98–110)
Creatinine: 0.8 mg/dL (ref 0.60–0.93)
Globulin: 2.8 g/dL (calc) (ref 1.9–3.7)
Glucose: 99 mg/dL (ref 65–99)
Potassium: 4.2 mmol/L (ref 3.5–5.3)
Sodium: 143 mmol/L (ref 135–146)
Total Protein: 7 g/dL (ref 6.1–8.1)
eGFR African American: 87 mL/min/{1.73_m2} (ref >=60)
eGFR non-Afr.American: 75 mL/min/{1.73_m2} (ref >=60)

## 2018-07-10 LAB — TSH, BLOOD: TSH: 0.67 mIU/L (ref 0.40–4.50)

## 2018-07-10 LAB — GLYCOSYLATED HGB(A1C), BLOOD: Hgb A1C: 5.1 %{Hb} (ref <5.7)

## 2018-07-10 LAB — RANDOM URINE MICROALB/CREAT RATIO PANEL
Creatinine, Random Urine: 131 mg/dL (ref 20–275)
Microalbumin Random: 0.4 mg/dL
Microalbumin Ratio: 3 ug/mg{creat} (ref <30)

## 2018-07-10 LAB — LIPID(CHOL FRACT) PANEL, BLOOD
Chol/HDLC Ratio: 3 (calc) (ref <5.0)
Cholesterol: 240 mg/dL — ABNORMAL HIGH (ref <200)
HDL Cholesterol: 79 mg/dL (ref >=50)
LDL-Cholesterol: 146 mg/dL — ABNORMAL HIGH
Non-HDL Cholesterol: 161 mg/dL (calc) — ABNORMAL HIGH (ref <130)
Triglycerides: 62 mg/dL (ref <150)

## 2018-07-10 LAB — VITAMIN D, 25-OH TOTAL: Vitamin D, 25-OH, Total: 32 ng/mL (ref 30–100)

## 2018-07-10 LAB — FREE THYROXINE, BLOOD: Free T4: 1.2 ng/dL (ref 0.8–1.8)

## 2018-07-10 NOTE — Telephone Encounter (Signed)
Patient requests most recent lab results to outside thyroid Dr Eliberto Ivory phone: [phone] (does not have fax number.)    Asks clinic to call her to confirm. Thank you.

## 2018-07-11 ENCOUNTER — Encounter: Payer: Self-pay | Admitting: Psychiatry

## 2018-07-11 ENCOUNTER — Other Ambulatory Visit: Payer: Self-pay | Admitting: Psychiatry

## 2018-07-11 NOTE — Telephone Encounter (Signed)
Tori, Please assist. Thank you.

## 2018-07-12 MED ORDER — ARIPIPRAZOLE 30 MG OR TABS
30.0000 mg | ORAL_TABLET | Freq: Every day | ORAL | 2 refills | Status: DC
Start: 2018-07-12 — End: 2018-09-17

## 2018-07-14 NOTE — Progress Notes (Addendum)
Outpatient Psychiatry Progress Note      Chief Complaint:   Regularly scheduled follow-up appointment    Identifying Information:  Kyran Bielen is a 73 year old female who presents today for a follow-up appointment.     Interval History:  [facility] MYC VIDEO VISIT Q:   Is patient in state of [place]?: Yes    Do you consent to proceed with the Video Visit today?: Yes      Nalani Disheris a72 year oldF with a past psychiatric history of schizoaffective d/o, bipolar type, OCD and unspecified insomnia who presents for follow-up. Pt reports mood, "Fine", and denies suicidal ideation, intent and plan. Pt denies associated depressive symptoms incl amotivation, anhedonia, decreased interest, decreased interest and hopelessness. Pt reports stable anxiety. Pt denies auditory and visual hallucinations, and does not endorse obvious delusions at this encounter. Pt denies current or recent decreased need for sleep or increased energy > 4 d, hyperverbality, racing thoughts, grandiosity and goal-directed bevavior c/w manic or hypomanic episode at this encounter. Pt reports stably improved quality of sleep, and reports regular sleep schedule. Discussed indication for fluphenazine specifically. Pt reports prolonged period of stability wrt psychotic s/s on current regimen including fluphenazine. Pt reports "many" failed trials with other medications and combinations of medications, including 7 hospitliazations as outside providers titrated to current medication regimen. Pt reports having self-tapered and discontinued zonisamide, without noticeable change in mood, anxiety or psychotic s/s. Pt reports preference for continuing current medication regimen incl fluphenazine at this time, after discussing risks, benefits and alternate treatments, which the patient appeared to understand.      Review of Systems:  Denies fever, chills, chest pain, dyspnea, dizziness, syncope, recent falls, headaches, nausea, and vomiting.      Current  Outpatient Medications:   .  ARIPiprazole (ABILIFY) 30 MG tablet, Take 1 tablet (30 mg) by mouth daily., Disp: 90 tablet, Rfl: 2  .  aspirin 81 MG tablet, Take 81 mg by mouth daily., Disp: , Rfl:   .  atorvastatin (LIPITOR) 10 MG tablet, Take 1 tablet (10 mg) by mouth daily., Disp: 90 tablet, Rfl: 3  .  BIOTIN PO, Take 1 capsule by mouth daily., Disp: , Rfl:   .  Calcium Carbonate-Vit D-Min (CALCIUM 1200 PO), Take 1 tablet by mouth daily., Disp: , Rfl:   .  Cholecalciferol (VITAMIN D3) 50 MCG (2000 UT) capsule, Take 1 capsule by mouth daily., Disp: , Rfl:   .  Clobetasol Propionate (IMPOYZ) 0.025 % CREA, Apply topically., Disp: , Rfl:   .  desvenlafaxine (PRISTIQ) 50 MG TB24, Take 1 tablet (50 mg) by mouth daily., Disp: 90 tablet, Rfl: 3  .  econazole (SPECTAZOLE) 1 % cream, Apply 1 Application topically 2 times daily. Use a small amount as directed, Disp: 1 Tube, Rfl: 1  .  econazole (SPECTAZOLE) 1 % cream, Apply 1 Application topically 2 times daily. Use a small amount as directed, Disp: 1 Tube, Rfl: 1  .  fluPHENAZine (PROLIXIN) 5 MG tablet, Take 1 tablet (5 mg) by mouth daily., Disp: 90 tablet, Rfl: 0  .  gabapentin (NEURONTIN) 600 MG tablet, Take 2 tablets (1,200 mg) by mouth at bedtime., Disp: 180 tablet, Rfl: 3  .  levothyroxine (SYNTHROID) 112 MCG tablet, Take 112 mcg by mouth daily., Disp: , Rfl: 0  .  levothyroxine (SYNTHROID) 125 MCG tablet, TK 1 T PO QAM, Disp: , Rfl:   .  mirabegron (MYRBETRIQ) 50 MG ER tablet, Take 50 mg by mouth daily., Disp:  30 tablet, Rfl: 3  .  omega-3 fatty acids, OTC, 1000 MG CAPS, Take 1 g by mouth daily (with food)., Disp: , Rfl:   .  Psyllium (METAMUCIL FIBER PO), Take 1 capsule by mouth daily., Disp: , Rfl:   .  QUEtiapine (SEROQUEL) 100 MG tablet, Take 1 tablet (100 mg) by mouth 2 times daily., Disp: 180 tablet, Rfl: 0  .  Red Yeast Rice Extract (RED YEAST RICE PO), Take 1 tablet by mouth daily., Disp: , Rfl:   .  sertraline (ZOLOFT) 100 MG tablet, Take 1 tablet (100 mg) by  mouth daily., Disp: 90 tablet, Rfl: 3  .  trihexyphenidyl (ARTANE) 1 MG tablet, Take 1 each (1 mg) by mouth 2 times daily., Disp: 60 tablet, Rfl: 3  .  trihexyphenidyl (ARTANE) 2 MG tablet, TAKE 1/2 TABLET BY MOUTH TWICE DAILY, Disp: 90 tablet, Rfl: 2  .  vitamin D3 (VITAMIN D3) 1000 units capsule, Take 1,000 Units by mouth daily., Disp: , Rfl:   .  ziprasidone (GEODON) 40 MG capsule, Take 2 capsules (80 mg) by mouth 2 times daily (with meals)., Disp: 60 capsule, Rfl: 2  .  zonisamide (ZONEGRAN) 100 MG capsule, Take 2 capsules (200 mg) by mouth 2 times daily., Disp: 360 capsule, Rfl: 0    Vital Signs:  LMP  (LMP Unknown)     Mental Status Exam:    Appearance: Fair hygiene, grooming.  Behavior: Cooperative. Calm.  Gait: No gait abnormality.  Muscle Strength/Tone:No PMR/PMA  Speech: Normal rate, tone and volume  Mood: "Fine"   Affect:Mildly flattened  Thought Process: Linear, goal-directed   - Associations: Intact  Thought Content: No suicidal or homicidal ideation. No auditory or visual hallucinations.  Attention Span and Concentration: Not distracted in conversation  Language: Able to name objects  Fund of Knowledge: Average  Memory: Both recent and remote memories are intact in context of conversation  Orientation: Intact to person, place  Insight/Judgment:Improved    LABS/ OTHER STUDIES    CHEM 7  Lab Results   Component Value Date/Time    NA 143 07/09/2018 07:41 AM    K 4.2 07/09/2018 07:41 AM    CL 105 07/09/2018 07:41 AM    BUN 26 (H) 07/09/2018 07:41 AM    GLU 99 07/09/2018 07:41 AM    [place] 9.8 07/09/2018 07:41 AM     LFT's  Lab Results   Component Value Date/Time    AST 14 07/09/2018 07:41 AM    ALT 9 07/09/2018 07:41 AM    ALB 4.2 07/09/2018 07:41 AM    TP 7.0 07/09/2018 07:41 AM     CBC  Lab Results   Component Value Date/Time    WBC 4.6 07/09/2018 07:41 AM    HGB 13.5 07/09/2018 07:41 AM    HCT 40.4 07/09/2018 07:41 AM    PLT 182 07/09/2018 07:41 AM     LIPID PANEL  Lab Results   Component Value  Date/Time    CHOL 240 (H) 07/09/2018 07:41 AM    HDL 79 07/09/2018 07:41 AM    TRIG 62 07/09/2018 07:41 AM     HGBA1c  No components found for: A1C;3  Miscellaneous   TSH   Date Value Ref Range Status   07/09/2018 0.67 0.40 - 4.50 mIU/L Final        Scales:   No flowsheet data found.  No flowsheet data found.    AIMS (02/07/18):  Facial & Oral Movements: 6  Extremity movements: 3  Trunk Movement: 0  Global Judgment: 1  Total: 10    AIMS (02/20/17):  Facial & Oral Movements: 0  Extremity movements: 2  Trunk Movement: 0  Global Judgment: 1  Total: 3    Assessment:   Lakeshia Disheris a72 year oldF with a past psychiatric history of schizoaffective d/o, bipolar type, OCD and unspecified insomnia who presents for follow-up.Pt demonstrates chronic, stable psychotic delusion, with some evidence of cognitive decline, thought process disorganization and (previously observed) affective blunting.Pt reports stably improved mood, associated depressive symptoms, anxiety and psychotic symptoms (though pt previously endorsed stable psychotic delusion, not elicited at this encounter, in addition to some evidence of cognitive decline, thought process disorganization and affective blunting). Plan to continue current psychotropic medications at this time, esp given COVID-19 lifestyle changes.    She is assessed to be at alow acute risk of suicide.   Clinical Global Impression - Severity:4= Moderately ill  :Suggested Guidelines= Overt symptomatology causing noticeable, but modest, functional impairment. .    Diagnosis/Diagnoses:     ICD-10-CM ICD-9-CM   1. Schizoaffective disorder, bipolar type (CMS-HCC) F25.0 295.70   2. Anxiety F41.9 300.00   3. Insomnia, unspecified type G47.00 780.52   4. 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  #Psychosis:   Continue quetiapine 200 mg PO QHS   Continue fluphenazine 5 mg PO qDaily   Continue aripiprazole 30 mgPOqDaily   Continue ziprasidone 80 mgPOqDaily  #EPS   Continuetrihedyphenidyl(Artane)1 mg POBIDfor EPS  # Anxiety, OCD   Continue gabapentin 1200 mgPO QHS forinsomnia   Continue desvenlafaxine 50 mgqDaily   Continuesertraline 100 mg qDaily     D/c zonisamide 100 mg PO qDaily for mood stabilization (off-label)    In-session psychotherapy:  I spent 24 minutes providing psychotherapy for this encounter.  Treatment modality(ies) employed: Psychoeducation and supportive  Progress to date: Improving  -- Supportive psychotherapy provided   -- Discussed COVID-19 related lifestyle changes, and, in this case, positive impact on relationship w/ pt husband  -- Discussed possible actions to take in response to increased fluphenazine pricing (described above)  -- Discussed changing perception of COVID-19 associated quarantine      Discussed plan for excursion, to approximate previously regular extended trips  -- Discussed COVID-19 associated quarantine, and impact on livelihood and daily activities  -- Discussed financial stressor of recently increased medication prices, and offered alternatives to seeking better coverage in addition to [employer] coupon  -- Psychoeducation regarding current regimen, esp sleep aids, and AEs of antipsychotics incl weight gain  -- Psychoeducation regarding current regimen, and possible future plans to reduce polypharmacy to meet pt therapeutic goals to reduce weight gain and sedation  -- F/u SMART goals  -- Walk of any length at least one per week  -- Go to gym of any length at least once per week  -- Extensive psychoeducation re clozapine and cognitive effects of anticholinergics discussed  Treatment plan: Continue psychoeducation and supportive  Level of Care: Pt currently does not meet criteria for inpatient psychiatric  hospitalization.    Note Author:  Elayne Guerin, MD  Resident/Fellow Physician

## 2018-07-15 ENCOUNTER — Ambulatory Visit: Payer: Medicare Other | Admitting: Urology

## 2018-07-16 ENCOUNTER — Encounter: Payer: Self-pay | Admitting: Psychiatry

## 2018-07-16 ENCOUNTER — Telehealth: Payer: Self-pay | Admitting: Internal Medicine

## 2018-07-16 NOTE — Telephone Encounter (Signed)
Most recent labs have been sent to the number provided in the duplicate message.

## 2018-07-16 NOTE — Telephone Encounter (Signed)
Most recent labs have been faxed to the number provided below.

## 2018-07-16 NOTE — Telephone Encounter (Signed)
Patient calling would like to have last lab work results done last week to be sent to Dr. Lorin Picket , [facility] endocrinologist  fax [phone]. Thank you     Upcoming appointment on 06/04.

## 2018-07-17 ENCOUNTER — Ambulatory Visit: Payer: Medicare Other | Attending: Internal Medicine | Admitting: Internal Medicine

## 2018-07-17 ENCOUNTER — Encounter: Payer: Self-pay | Admitting: Internal Medicine

## 2018-07-17 VITALS — BP 138/68 | HR 72 | Temp 98.0°F | Resp 18 | Ht 67.0 in | Wt 278.0 lb

## 2018-07-17 DIAGNOSIS — R799 Abnormal finding of blood chemistry, unspecified: Secondary | ICD-10-CM | POA: Insufficient documentation

## 2018-07-17 DIAGNOSIS — E785 Hyperlipidemia, unspecified: Secondary | ICD-10-CM | POA: Insufficient documentation

## 2018-07-17 DIAGNOSIS — Z1231 Encounter for screening mammogram for malignant neoplasm of breast: Secondary | ICD-10-CM | POA: Insufficient documentation

## 2018-07-17 DIAGNOSIS — E559 Vitamin D deficiency, unspecified: Secondary | ICD-10-CM | POA: Insufficient documentation

## 2018-07-17 DIAGNOSIS — Z6841 Body Mass Index (BMI) 40.0 and over, adult: Secondary | ICD-10-CM

## 2018-07-17 DIAGNOSIS — Z1239 Encounter for other screening for malignant neoplasm of breast: Secondary | ICD-10-CM

## 2018-07-17 DIAGNOSIS — E039 Hypothyroidism, unspecified: Secondary | ICD-10-CM | POA: Insufficient documentation

## 2018-07-17 DIAGNOSIS — E669 Obesity, unspecified: Secondary | ICD-10-CM | POA: Insufficient documentation

## 2018-07-17 MED ORDER — ATORVASTATIN CALCIUM 10 MG OR TABS
10.0000 mg | ORAL_TABLET | Freq: Every day | ORAL | 3 refills | Status: DC
Start: 2018-07-17 — End: 2019-07-07

## 2018-07-17 NOTE — Progress Notes (Signed)
FOLLOW UP VISIT NOTE    Date of Visit: July 17, 2018    Chief Complaint: Follow up Results      History of Present Illness: Julie Monroe is a 73 year old female here for Follow up Results    Follow up on video visit from 06/10/2018:    Labs completed on 07/09/2018:  CMP ok  LFTs nl  Cholesterol panel: TC 240, TG 62, HDL 79, LDL 146    AIC 5.1  TFTs nl  Urine protein normal  CBC nl    HLD - Cholesterol panel: TC 240, TG 62, HDL 79, LDL 146, not currently on statin, open to this.      Vitamin D - only taking supplement 2 times per week; vitamin D level on 07/09/2018 was 32.     Biopsy done in 01/20220 in [facility], and initially had a scab formed but then saw derm and it was frozen last week and now it looks more irritated. No drainage no pain.     Rash in intertriginous areas - econazole initially prescribed and was working but wasn't working as well.  Seen by derm and added nystatin-TMC to regimen.      Has had urinary leakage, seeing Dr. Alva Garnet.  Was on oxybutynin but was not helping and it was stopped.  Since stopping it, has worsened again.  Has f/u with her in 5 days.  Has also tried injections (4 out of 12).    BMI 43 - would like to talk to dr. Julianne Handler about options.    Heart murmur - asymptomatic. 2d echo completed in 03/2018 showed the following:     Referring Physician: [phone] [place] , cc:   Indications:     MURMUR   CPT Codes:      Complete Echocardiogram - 93306.   Technical Quality:  Image quality for this study is good.   Procedures Performed: Complete Echocardiogram, including 2D, m-mode, spectral             Doppler and color Doppler was performed.         Appropriate Use Criteria: Initial evaluation when there is a reasonable suspicion of valvular or structural heart disease; AUC score = 9.         2D AND M-MODE MEASUREMENTS (normal ranges within parentheses):   Left Ventricle:     Normal   Aorta/Left Atrium:     Normal   IVSd (2D):   0.90 cm  (0.7-1.1)  Aortic Sinus:   3.46 cm   LVPWd (2D):   1.00 cm (0.7-1.1)  Ao ST junct:    3.18 cm   LVIDd (2D):   4.52 cm (3.4-5.7)  LA Vol Index A/L:   LVIDs (2D):   3.27 cm       LA Vol Index A4C 27.9 ml/m                    LA Vol Index A2C 45.2 ml/m                    LA Vol Index BP  36.0 ml/m   Right Ventricle:          RA Vol Index A/L:   RVd A4C base(2D): 3.51 cm      RA Vol Index A4C 20.8 ml/m   RVd Max SAX:   3.80 cm (3.8-1.0)      LV SYSTOLIC FUNCTION BY 2D PLANIMETRY (MOD):   EF-Biplane: 17.5 %   LV DIASTOLIC  FUNCTION:   MV Peak E: 0.72 m/s e', MV Ann: 0.07 m/s RUPV A Vmax: 0.24 m/s   MV Peak A: 0.81 m/s E/e' Ratio: 10.03  RUPV A Dur: 127 msec   E/A Ratio: 0.88   Decel Time: 244 msec MV A Dur   155 msec      SPECTRAL DOPPLER ANALYSIS (where applicable)   MV A6/3 Time: 70.82 msec   MV Area, PHT: 3.11 cm         Right Ventricle (RV) Function by 2D: RV TAPSE: 2.0 cm      Mitral Insufficiency by PISA:   MR Volume: 12.15 ml MR Flow Rate: 38.24 ml/s MR EROA: 6.92 mm      Aortic Valve: AoV Max Velocity: 2.65 m/s AoV Peak PG: 28.1 mmHg AoV Mean PG: 16.3 mmHg      LVOT Vmax: 0.96 m/s LVOT VTI: 0.219 m LVOT Diameter: 2.26 cm      AoV Area, Vmax: 1.46 cm AoV Area, VTI: 1.44 cm AoV Area, Vmn: 1.34 cm      AoV Area, planim: 1.88 cm      Tricuspid Valve and PA/RV Systolic Pressure: TR Max Velocity: 2.61 m/s RA Pressure: 3 mmHg RVSP/PASP: 30.2 mmHg      Pulmonic Valve:   PV Max Velocity: 0.80 m/s PV Max PG: 2.5 mmHg PV Mean PG:         PHYSICIAN INTERPRETATION:   Left Ventricle: The left ventricular internal size is normal. There is global normal left ventricular systolic function. Left ventricular ejection fraction is 58% by Biplane.   Right Ventricle: The right ventricular size is mildly enlarged. Global RV systolic function is normal.   Left Atrium: The left atrium is mildly dilated.   Right Atrium: The right  atrium is normal in size and structure.   Mitral Valve: Structurally normal mitral valve, with normal leaflet excursion. Trace mitral valve regurgitation is seen.   Tricuspid Valve: Structurally normal tricuspid valve, with normal leaflet excursion. Physiologic tricuspid regurgitation was seen.   Aortic Valve: Mild aortic stenosis is present. No evidence of aortic valve regurgitation is seen.   Pulmonic Valve: The pulmonic valve was not well visualized. No indication of pulmonic valve regurgitation. No evidence of pulmonic stenosis.   Aorta: The aortic root is normal in size and structure. The ascending aorta was not well visualized.   Pericardium: There is no evidence of pericardial effusion.   Pulmonary Artery: The pulmonary artery is not well seen. The tricuspid regurgitant velocity is 2.61 m/s; and with an assumed right atrial pressure of 3 mmHg, the estimated right ventricular systolic pressure is mildly elevated at 30.2 mmHg.   Venous: A normal flow pattern is recorded from the right upper pulmonary vein. The inferior vena cava is normal in size and exhibits greater than 50% respiratory size variation.         Summary:   1. Normal left ventricular systolic function. The left ventricular ejection fraction is 58% by Biplane.   2. Mild aortic valve stenosis.   3. Mildly enlarged right ventricle.   4. Left atrium mildly dilated.          Past Medical History:  Patient Active Problem List   Diagnosis   . Schizoaffective disorder, bipolar type (CMS-HCC)   . Insomnia   . Morbid obesity (CMS-HCC)   . Tremor   . Osteoarthrosis   . Hypothyroidism   . Obsessive-compulsive disorder, unspecified type   . Anxiety   . Extrapyramidal and movement disorder  Past Surgical History:  Past Surgical History:   Procedure Laterality Date   . Biilat. Knee replacement     . Rt toe surgery     . TOTAL KNEE ARTHROPLASTY Bilateral          Social Health:  Social History     Socioeconomic History   . Marital status: Married        Spouse name: Not on file   . Number of children: Not on file   . Years of education: Not on file   . Highest education level: Not on file   Occupational History   . Not on file   Social Needs   . Financial resource strain: Not on file   . Food insecurity:     Worry: Not on file     Inability: Not on file   . Transportation needs:     Medical: Not on file     Non-medical: Not on file   Tobacco Use   . Smoking status: Former Smoker     Packs/day: 2.00     Years: 30.00     Pack years: 60.00   . Smokeless tobacco: Never Used   Substance and Sexual Activity   . Alcohol use: No     Frequency: Never   . Drug use: No   . Sexual activity: Never   Lifestyle   . Physical activity:     Days per week: Not on file     Minutes per session: Not on file   . Stress: Not on file   Relationships   . Social connections:     Talks on phone: Not on file     Gets together: Not on file     Attends religious service: Not on file     Active member of club or organization: Not on file     Attends meetings of clubs or organizations: Not on file     Relationship status: Not on file   . Intimate partner violence:     Fear of current or ex partner: Not on file     Emotionally abused: Not on file     Physically abused: Not on file     Forced sexual activity: Not on file   Other Topics Concern   . Not on file   Social History Narrative   . Not on file         Family Health:  Family History   Problem Relation Age of Onset   . Other Mother         Wegener   . Cancer Brother         Leukemia   . Cancer Father         Leukemia   . Heart Disease Father    . Hypertension Father    . No Known Problems Sister    . Cancer Brother         Bladder Cancer         Allergies:   No Known Allergies      Current Meds:   Outpatient Medications Marked as Taking for the 07/17/18 encounter (Office Visit) with Charlotte Crumb, MD   Medication Sig Dispense Refill   . ARIPiprazole (ABILIFY) 30 MG tablet Take 1 tablet (30 mg) by mouth daily. 90 tablet 2   . aspirin 81 MG  tablet Take 81 mg by mouth daily.     Marland Kitchen BIOTIN PO Take 1 capsule by mouth daily.     Marland Kitchen  Calcium Carbonate-Vit D-Min (CALCIUM 1200 PO) Take 1 tablet by mouth daily.     . Cholecalciferol (VITAMIN D3) 50 MCG (2000 UT) capsule Take 1 capsule by mouth daily.     . Clobetasol Propionate (IMPOYZ) 0.025 % CREA Apply topically.     Marland Kitchen desvenlafaxine (PRISTIQ) 50 MG TB24 Take 1 tablet (50 mg) by mouth daily. 90 tablet 3   . econazole (SPECTAZOLE) 1 % cream Apply 1 Application topically 2 times daily. Use a small amount as directed 1 Tube 1   . econazole (SPECTAZOLE) 1 % cream Apply 1 Application topically 2 times daily. Use a small amount as directed 1 Tube 1   . fluPHENAZine (PROLIXIN) 5 MG tablet Take 1 tablet (5 mg) by mouth daily. 90 tablet 0   . gabapentin (NEURONTIN) 600 MG tablet Take 2 tablets (1,200 mg) by mouth at bedtime. 180 tablet 3   . levothyroxine (SYNTHROID) 112 MCG tablet Take 112 mcg by mouth daily.  0   . levothyroxine (SYNTHROID) 125 MCG tablet TK 1 T PO QAM     . omega-3 fatty acids, OTC, 1000 MG CAPS Take 1 g by mouth daily (with food).     Marland Kitchen oxybutynin (DITROPAN XL) 10 MG Controlled-Release tablet TAKE 1 TABLET BY MOUTH EVERY DAY 90 tablet 3   . Psyllium (METAMUCIL FIBER PO) Take 1 capsule by mouth daily.     . QUEtiapine (SEROQUEL) 100 MG tablet Take 1 tablet (100 mg) by mouth 2 times daily. 180 tablet 0   . Red Yeast Rice Extract (RED YEAST RICE PO) Take 1 tablet by mouth daily.     . sertraline (ZOLOFT) 100 MG tablet Take 1 tablet (100 mg) by mouth daily. 90 tablet 3   . vitamin D3 (VITAMIN D3) 1000 units capsule Take 1,000 Units by mouth daily.     . ziprasidone (GEODON) 40 MG capsule Take 2 capsules (80 mg) by mouth 2 times daily (with meals). 60 capsule 2   . zonisamide (ZONEGRAN) 100 MG capsule Take 2 capsules (200 mg) by mouth 2 times daily. 360 capsule 0       Review of Systems: 12-point ROS is negative except as stated above in the HPI    Vital Signs:  BP 138/68 (BP Location: Right arm, BP  Patient Position: Sitting)   Pulse 72   Temp 98 F (36.7 C) (Temporal)   Resp 18   Ht '5\' 7"'  (1.702 m)   Wt 126.1 kg (278 lb)   LMP  (LMP Unknown)   BMI 43.54 kg/m     PHYSICAL EXAM:  General: Alert, cooperative, and interactive  Pulmonary: clear bilaterally; no wheezes, rales or rhonchi  Cardiovascular: regular rhythm; regular rate; normal S1,S2; Grade 3 holosystolic murmur best heard in RUSB  Abdominal: morbidly obese; soft; nondistended; nontender to palpation; normal bowel sounds  Skin Details: small lesion in LLE over area of recent cryo, mild blistering but no purulence, erythema  Neurological: alert and oriented times 3  Psychiatric Details:  Affect normal; mood matches affect    Diagnostic Data:  Albumin   Date Value Ref Range Status   07/09/2018 4.2 3.6 - 5.1 g/dL Final     ALT (SGPT)   Date Value Ref Range Status   07/09/2018 9 6 - 29 U/L Final     AST (SGOT)   Date Value Ref Range Status   07/09/2018 14 10 - 35 U/L Final     BUN   Date Value Ref Range Status  07/09/2018 26 (H) 7 - 25 mg/dL Final     Calcium   Date Value Ref Range Status   07/09/2018 9.8 8.6 - 10.4 mg/dL Final     Chloride   Date Value Ref Range Status   07/09/2018 105 98 - 110 mmol/L Final     Cholesterol   Date Value Ref Range Status   07/09/2018 240 (H) <200 mg/dL Final     Creatinine   Date Value Ref Range Status   07/09/2018 0.80 0.60 - 0.93 mg/dL Final     Comment:     For patients >62 years of age, the reference limit  for Creatinine is approximately 13% higher for people  identified as African-American.          eGFR African American   Date Value Ref Range Status   07/09/2018 87 > OR = 60 mL/min/1.41m Final     eGFR non-Afr.American   Date Value Ref Range Status   07/09/2018 75 > OR = 60 mL/min/1.755mFinal     Glucose   Date Value Ref Range Status   07/09/2018 99 65 - 99 mg/dL Final     Comment:                   Fasting reference interval          HDL Cholesterol   Date Value Ref Range Status   07/09/2018 79 > OR = 50  mg/dL Final     HGB   Date Value Ref Range Status   07/09/2018 13.5 11.7 - 15.5 g/dL Final     Hgb A1C   Date Value Ref Range Status   07/09/2018 5.1 <5.7 % of total Hgb Final     Comment:     For the purpose of screening for the presence of  diabetes:     <5.7%       Consistent with the absence of diabetes  5.7-6.4%    Consistent with increased risk for diabetes              (prediabetes)  > or =6.5%  Consistent with diabetes     This assay result is consistent with a decreased risk  of diabetes.     Currently, no consensus exists regarding use of  hemoglobin A1c for diagnosis of diabetes in children.     According to American Diabetes Association (ADA)  guidelines, hemoglobin A1c <7.0% represents optimal  control in non-pregnant diabetic patients. Different  metrics may apply to specific patient populations.   Standards of Medical Care in Diabetes(ADA).           LDL-Cholesterol   Date Value Ref Range Status   07/09/2018 146 (H) mg/dL (calc) Final     Comment:     Reference range: <100     Desirable range <100 mg/dL for primary prevention;    <70 mg/dL for patients with CHD or diabetic patients   with > or = 2 CHD risk factors.     LDL-C is now calculated using the Martin-Hopkins   calculation, which is a validated novel method providing   better accuracy than the Friedewald equation in the   estimation of LDL-C.   MaCresenciano Genret al. JAAnnamaria [id number];211([id number]   (http://education.[facility].com/faq/FAQ164)       PLT   Date Value Ref Range Status   07/09/2018 182 140 - 400 Thousand/uL Final     Potassium   Date Value Ref Range Status   07/09/2018 4.2 3.5 -  5.3 mmol/L Final     Sodium   Date Value Ref Range Status   07/09/2018 143 135 - 146 mmol/L Final     Triglycerides   Date Value Ref Range Status   07/09/2018 62 <150 mg/dL Final     WBC   Date Value Ref Range Status   07/09/2018 4.6 3.8 - 10.8 Thousand/uL Final       Imaging Data:  See 2D echo above    Assessment and Plan:  1. Hyperlipidemia, unspecified  hyperlipidemia type  LDL not at goal, so will start on Lipitor 44m QHS, will monitor LFTs and CK, f/u FLP.  Risks, benefits and potential SE reviewed with pt and she is amenable with starting on statin.  - atorvastatin (LIPITOR) 10 MG tablet; Take 1 tablet (10 mg) by mouth daily.  Dispense: 90 tablet; Refill: 3  - Comprehensive Metabolic Panel - See Instructions; Future  - Glycosylated Hgb(A1C), Blood Lavender; Future  - Lipid Panel Green Plasma Separator Tube; Future  - CPK, Blood Green Plasma Separator Tube; Future    2. Obesity, unspecified classification, unspecified obesity type, unspecified whether serious comorbidity present  Morbidly obese with BMI > 30. Advise pt to meet with Dr. NJulianne Handlerto discuss options.  - Consult/Referral to Bariatric Medicine  - Glycosylated Hgb(A1C), Blood Lavender; Future    3. Hypothyroidism, unspecified type  TFTs normal, continue current mgmt.  - TSH, Blood - See Instructions; Future  - Free Thyroxine, Blood - See Instructions; Future    4. Abnormal finding of blood chemistry, unspecified   Will continue to monitor, esp given obesity status.  - Glycosylated Hgb(A1C), Blood Lavender; Future    5. Breast cancer screening  Will order screening, is due.  - Screening Digital Mammogram Bilateral; Future    6. Encounter for screening mammogram for malignant neoplasm of breast   See above.  - Screening Digital Mammogram Bilateral; Future    7. Vitamin D deficiency  Not at goal, but is not taking her supplement daily, so advise her to take daily.   - Vitamin D, 25-OH Total Yellow serum separator tube; Future

## 2018-07-17 NOTE — Progress Notes (Signed)
Patient wishing to use [facility], so orders transcribed.

## 2018-07-19 ENCOUNTER — Encounter: Payer: Self-pay | Admitting: Internal Medicine

## 2018-07-19 ENCOUNTER — Telehealth: Payer: Self-pay | Admitting: Internal Medicine

## 2018-07-19 NOTE — Telephone Encounter (Signed)
Already called and spoke with patient and advised them they are on a waitlist for Dr. Loann Quill for Bariatrics.  No sooner appt is available at this time

## 2018-07-19 NOTE — Telephone Encounter (Signed)
Patient would like to have a call back in regards to getting a sooner appointment then her august appointment. Please call the patient back at [phone] or contact the husband at [phone] ok to leave a detail message best call back time is anytime

## 2018-07-22 ENCOUNTER — Encounter: Payer: Self-pay | Admitting: Urology

## 2018-07-22 ENCOUNTER — Telehealth: Payer: Self-pay | Admitting: Internal Medicine

## 2018-07-22 ENCOUNTER — Ambulatory Visit (INDEPENDENT_AMBULATORY_CARE_PROVIDER_SITE_OTHER): Payer: Medicare Other | Admitting: Urology

## 2018-07-22 VITALS — BP 129/73 | HR 70 | Temp 98.1°F | Ht 67.0 in | Wt 270.0 lb

## 2018-07-22 DIAGNOSIS — N3281 Overactive bladder: Secondary | ICD-10-CM

## 2018-07-22 DIAGNOSIS — N3941 Urge incontinence: Secondary | ICD-10-CM

## 2018-07-22 MED ORDER — MIRABEGRON ER 50 MG PO TB24
50.0000 mg | ORAL_TABLET | Freq: Every day | ORAL | 3 refills | Status: DC
Start: 2018-07-22 — End: 2018-10-14

## 2018-07-22 NOTE — Telephone Encounter (Signed)
Patient is calling to request a sooner appointment. Per patient it is urgent, please assist @ [phone]. Thank you.

## 2018-07-22 NOTE — Progress Notes (Signed)
[facility]  [address]  [place], [place] [address]  [phone]      Urology Follow Up Note      HISTORY OF PRESENT ILLNESS:  Ms. Knodel is a 73 year old-year-old woman here for follow up. She has a history of urgency urinary incontinence. Cystoscopy on12/16/19demonstrated no lesions. She has been treated withoxybutynin and with PTNS x 4 sessions. She was doing well for some time after the PTNS, but she stopped the treatments because she did not feel it was helping. Then her symptoms started to worsen again with urgency and urgency incontinence.    Today she reports she tried going off the oxybutynin because she did not feel she needed it. After going off of it she has noted a worsening of her leakage and urgency. She continues to drink caffeine but has cut down to 2 iced coffees per day and 1-2 small sodas.     Review of Systems - no fevers or dysuria      Current Outpatient Medications:   .  ARIPiprazole (ABILIFY) 30 MG tablet, Take 1 tablet (30 mg) by mouth daily., Disp: 90 tablet, Rfl: 2  .  aspirin 81 MG tablet, Take 81 mg by mouth daily., Disp: , Rfl:   .  atorvastatin (LIPITOR) 10 MG tablet, Take 1 tablet (10 mg) by mouth daily., Disp: 90 tablet, Rfl: 3  .  BIOTIN PO, Take 1 capsule by mouth daily., Disp: , Rfl:   .  Calcium Carbonate-Vit D-Min (CALCIUM 1200 PO), Take 1 tablet by mouth daily., Disp: , Rfl:   .  Cholecalciferol (VITAMIN D3) 50 MCG (2000 UT) capsule, Take 1 capsule by mouth daily., Disp: , Rfl:   .  Clobetasol Propionate (IMPOYZ) 0.025 % CREA, Apply topically., Disp: , Rfl:   .  desvenlafaxine (PRISTIQ) 50 MG TB24, Take 1 tablet (50 mg) by mouth daily., Disp: 90 tablet, Rfl: 3  .  econazole (SPECTAZOLE) 1 % cream, Apply 1 Application topically 2 times daily. Use a small amount as directed, Disp: 1 Tube, Rfl: 1  .  econazole (SPECTAZOLE) 1 % cream, Apply 1 Application topically 2 times daily. Use a small amount as directed, Disp: 1 Tube, Rfl: 1  .  fluPHENAZine  (PROLIXIN) 5 MG tablet, Take 1 tablet (5 mg) by mouth daily., Disp: 90 tablet, Rfl: 0  .  gabapentin (NEURONTIN) 600 MG tablet, Take 2 tablets (1,200 mg) by mouth at bedtime., Disp: 180 tablet, Rfl: 3  .  levothyroxine (SYNTHROID) 112 MCG tablet, Take 112 mcg by mouth daily., Disp: , Rfl: 0  .  levothyroxine (SYNTHROID) 125 MCG tablet, TK 1 T PO QAM, Disp: , Rfl:   .  omega-3 fatty acids, OTC, 1000 MG CAPS, Take 1 g by mouth daily (with food)., Disp: , Rfl:   .  oxybutynin (DITROPAN XL) 10 MG Controlled-Release tablet, TAKE 1 TABLET BY MOUTH EVERY DAY, Disp: 90 tablet, Rfl: 3  .  Psyllium (METAMUCIL FIBER PO), Take 1 capsule by mouth daily., Disp: , Rfl:   .  QUEtiapine (SEROQUEL) 100 MG tablet, Take 1 tablet (100 mg) by mouth 2 times daily., Disp: 180 tablet, Rfl: 0  .  Red Yeast Rice Extract (RED YEAST RICE PO), Take 1 tablet by mouth daily., Disp: , Rfl:   .  sertraline (ZOLOFT) 100 MG tablet, Take 1 tablet (100 mg) by mouth daily., Disp: 90 tablet, Rfl: 3  .  trihexyphenidyl (ARTANE) 1 MG tablet, Take 1 each (1  mg) by mouth 2 times daily., Disp: 60 tablet, Rfl: 3  .  trihexyphenidyl (ARTANE) 2 MG tablet, TAKE 1/2 TABLET BY MOUTH TWICE DAILY, Disp: 90 tablet, Rfl: 2  .  vitamin D3 (VITAMIN D3) 1000 units capsule, Take 1,000 Units by mouth daily., Disp: , Rfl:   .  ziprasidone (GEODON) 40 MG capsule, Take 2 capsules (80 mg) by mouth 2 times daily (with meals)., Disp: 60 capsule, Rfl: 2  .  zonisamide (ZONEGRAN) 100 MG capsule, Take 2 capsules (200 mg) by mouth 2 times daily., Disp: 360 capsule, Rfl: 0    Past Medical History:   Diagnosis Date   . Bipolar affect, depressed (CMS-HCC)    . Chronic back pain    . Hypothyroidism    . Insomnia    . OAB (overactive bladder)    . OCD (obsessive compulsive disorder)    . Osteopenia    . Schizophrenia (CMS-HCC)        No Known Allergies    PHYSICAL EXAMINATION:  Temperature: 98.1 F (36.7 C) (06/08 0940)  Blood pressure (BP): 129/73 (06/08 0940)  Heart Rate: 70 (06/08  0940)  GENERAL:  Patient is a well appearing and in no acute distress.   RESPIRATIONS:  No respiratory distress, breathing non-labored.  CARDIOVASCULAR:  Regular rate, rhythm.   ABDOMEN:  Soft, nontender, nondistended  PSYCH: Normal mood and affect  NEURO: A&O x 3; normal gait    ASSESSMENT:  Julie Monroe is a 73 year old woman with urgency urinary incontinence due to overactive bladder.      PLAN:  Discussed options for treatment. Encouraged first line therapy with further decreasing caffeine intake.  In regards to second-line therapy we discussed restarting her oxybutynin however she did not find much relief of her symptoms with this.  She would like to try an alternative medication and so I will prescribe Myrbetriq.  We discussed possible side effects with Myrbetriq.  We also discussed 3rd line therapy was sacral neuromodulation or percutaneous tibial nerve stimulation.  At this time she is not interested in these options.  She does want to work on weight loss and encourage this as this may help her incontinence as well.  I will see back in 3 months to see how she is doing

## 2018-07-23 ENCOUNTER — Telehealth: Payer: Self-pay

## 2018-07-23 ENCOUNTER — Ambulatory Visit: Payer: Medicare Other | Admitting: Ophthalmology

## 2018-07-23 NOTE — Telephone Encounter (Signed)
Deborah front office supervisor spoke with patient

## 2018-07-24 NOTE — Addendum Note (Signed)
Addended by: Mellody Drown on: 07/24/2018 10:40 AM     Modules accepted: Level of Service

## 2018-07-24 NOTE — Progress Notes (Signed)
Attending Note:    Subjective:  I reviewed the history.  Patient interviewed and examined with the resident.    Review of Systems (ROS): As per the resident's note.  Past Medical, Family, Social History: As per the resident's note.    Objective:  I have examined the patient and I concur with the resident's exam.    Assessment and Plan:  Reviewed with the resident physician.  I agree with the resident's plan as documented.    See the resident's note for further details.    Katelee Schupp, M.D.  Attending Physician  Professor of Clinical Psychiatry

## 2018-07-26 ENCOUNTER — Telehealth: Payer: Self-pay | Admitting: Internal Medicine

## 2018-07-26 NOTE — Telephone Encounter (Signed)
Patient would like to have a call back in regards to getting a pill to taking including the cream that will take away of the patients rash that seem to be spreading. Please call the patient back at [phone] ok to leave a detail message best call back time is anytime

## 2018-07-30 ENCOUNTER — Ambulatory Visit: Payer: Medicare Other | Admitting: Internal Medicine

## 2018-07-30 NOTE — Telephone Encounter (Signed)
Per pt, okay to reply via MyChart since they've been having problems with [employer].

## 2018-07-30 NOTE — Telephone Encounter (Signed)
Spoke with patient who states the two creams that she is using are not helping and she is wondering if there is a pill that would help clear up her rash more quickly. Noted in PCP's office note that pt has been seeing a dermatologist. Confirmed this with her and advised that if a more specialized medication is needed to clear up this rash, the specialist is more likely to get this medication approved by pt's insurance, so advised she discuss this with her dermatologist. She verbalized understanding and states that she is actually seeing her dermatologist tomorrow for another issue, so will ask him then about an oral medication that might help clear up this rash. However, she would still like Dr. Eliseo Squires advice.

## 2018-07-30 NOTE — Telephone Encounter (Signed)
MyChart message sent notifying pt of PCP's response below.

## 2018-07-30 NOTE — Telephone Encounter (Signed)
Patient returning Leslie's call .

## 2018-07-30 NOTE — Telephone Encounter (Signed)
Agree, that it makes the most sense to see what dermatology can offer, esp if the rash is not responding to a topical treatment.  Thank you.

## 2018-08-07 ENCOUNTER — Encounter: Payer: Self-pay | Admitting: Psychiatry

## 2018-08-07 ENCOUNTER — Telehealth: Payer: Self-pay | Admitting: Psychiatry

## 2018-08-07 NOTE — Telephone Encounter (Signed)
LEFT VOICEMAIL TO RESCHEDULE MISSED APPNT W/ DR. Bowdon TODAY 6/24

## 2018-08-07 NOTE — Telephone Encounter (Addendum)
Provider contacted pt by phone. Pt reports mood, "OK", and denies suicidal ideation, intent and plan. Pt denies auditory and visual hallucinations, and does not endorse obvious delusions. Pt reports compliance with medications as directed, and denies AEs incl involuntary muscle contraction and tremors. Pt reports self-restarting zonisamide 100 mg PO BID, and denies AEs. Pt reports looking forward to pt daughter moving back from [place] and attending family barbecue.      Plan:  -- Decrease to trihexyphenidyl 1 mg PO QHS      Monitor for EPS (and discussed s/s to monitor)      Note, downititrating due to pt c/f memory loss 2/2 anticholinergics  -- Restart zonisamide 100 mg PO BID      Plan to taper and discontinue at future encounter  -- Continue other psychotropics as previously prescribed

## 2018-08-13 ENCOUNTER — Telehealth: Payer: Self-pay | Admitting: Internal Medicine

## 2018-08-13 ENCOUNTER — Ambulatory Visit: Payer: Medicare Other | Admitting: Ophthalmology

## 2018-08-13 NOTE — Telephone Encounter (Signed)
Patient is calling back states that she will be available any time this week

## 2018-08-13 NOTE — Telephone Encounter (Signed)
Spoke with patient and relayed Dr. Eliseo Squires response below. She verbalized understanding. Offered assistance in scheduling her in the [facility] this evening and she declined. Offered tomorrow and she stated she'll call back and schedule at her convenience, but was very Patent attorney.

## 2018-08-13 NOTE — Telephone Encounter (Signed)
Spoke with patient who developed a sore throat 2 days ago and states "it's pretty bad." Denies white spots or red streaks, though states it is red all over. Denies ear pain. Denies fever. Denies having been around anyone recently with same sxs or with strep throat. Out of town currently, but will be in [facility] tomorrow. Please advise. [place]?

## 2018-08-13 NOTE — Telephone Encounter (Signed)
Pt has sore throat. She refuses video/telephone appt. She only wants to see Dr. Leonette Monarch tomorrow. Pt lives in [place] and she will visit [facility]. Please call her back today. Thank you

## 2018-08-13 NOTE — Telephone Encounter (Signed)
[facility] clinic please, will need swab, I think.  Thank you.

## 2018-08-13 NOTE — Telephone Encounter (Signed)
Dr Leonette Monarch please advise what you would like to do.

## 2018-08-13 NOTE — Telephone Encounter (Signed)
Pt is requesting to speak to [facility] regarding appt this week due to sore throat. Please assist, thank you.

## 2018-08-16 ENCOUNTER — Encounter: Payer: Self-pay | Admitting: Psychiatry

## 2018-08-26 ENCOUNTER — Telehealth: Payer: Self-pay | Admitting: Internal Medicine

## 2018-08-26 NOTE — Telephone Encounter (Signed)
Patient PCP in [place]  misrouted to [place]

## 2018-08-26 NOTE — Telephone Encounter (Signed)
Patient is having possible covid symptom (sore throat) but declined video or telemedicine appointment would like an in person visit please call to advise

## 2018-08-26 NOTE — Telephone Encounter (Signed)
Last Tuesday (July 7th), pt's tooth partially broke. Pt had dental consult on July 8th? for tooth removal, was given amoxicillin 500mg  for 5 days prior to procedure as prophylaxis. Sore throat started some time after tooth broke. Pt has completed 5 days of Amoxicillin and sore throat persists. Pt's dentist referred pt to her PCP.      Denies fever, cough, muscle ache, loss of taste or any other COVID symptoms.     Pt scheduled to see Dr. Corie Chiquito tomorrow at 1020. Pt declined Zoom visit.

## 2018-08-26 NOTE — Telephone Encounter (Signed)
Pt following up on previous message -  Pt experiencing "sore throat" like pain, after dental procedure, took antibiotics for   5 days, has not been feeling well ever since.    Left VM on Urgent Iphone    Pt will wait for a call back.

## 2018-08-27 ENCOUNTER — Ambulatory Visit: Payer: Medicare Other | Attending: Internal Medicine | Admitting: Internal Medicine

## 2018-08-27 ENCOUNTER — Encounter: Payer: Self-pay | Admitting: Internal Medicine

## 2018-08-27 VITALS — BP 143/71 | HR 74 | Temp 97.7°F | Resp 20 | Wt 280.5 lb

## 2018-08-27 DIAGNOSIS — R070 Pain in throat: Secondary | ICD-10-CM | POA: Insufficient documentation

## 2018-08-27 DIAGNOSIS — Z6841 Body Mass Index (BMI) 40.0 and over, adult: Secondary | ICD-10-CM | POA: Insufficient documentation

## 2018-08-27 DIAGNOSIS — Z20828 Contact with and (suspected) exposure to other viral communicable diseases: Secondary | ICD-10-CM | POA: Insufficient documentation

## 2018-08-27 LAB — CORONAVIRUS DISEASE 2019 (COVID-19) SCOV
COVID-19 Comment: NOT DETECTED
COVID-19 Result: NOT DETECTED

## 2018-08-27 MED ORDER — ZIPRASIDONE HCL 80 MG OR CAPS
ORAL_CAPSULE | ORAL | Status: DC
Start: 2018-08-11 — End: 2018-09-17

## 2018-08-27 NOTE — Progress Notes (Signed)
DATE:  08/27/2018    HISTORY OF PRESENT ILLNESS  73 year old female here today regarding :  Sore throat for a week , no other symptoms but has had tooth pain and was treated for tooth infection with antibiotics for 5 days,    Taking lozenges for sore throat, no other complains such as fever, no nasal congestion, no headaches,   Tired of the pain, and has been taking 3 ibuprofen for a tooth pain now taking less for throat pain.    Past Medical History:   Diagnosis Date   . Bipolar affect, depressed (CMS-HCC)    . Chronic back pain    . Hypothyroidism    . Insomnia    . OAB (overactive bladder)    . OCD (obsessive compulsive disorder)    . Osteopenia    . Schizophrenia (CMS-HCC)        Past Surgical History:   Procedure Laterality Date   . Biilat. Knee replacement     . Rt toe surgery     . TOTAL KNEE ARTHROPLASTY Bilateral        ALLERGIES  No Known Allergies    Current Outpatient Medications   Medication Sig   . ARIPiprazole (ABILIFY) 30 MG tablet Take 1 tablet (30 mg) by mouth daily.   Marland Kitchen aspirin 81 MG tablet Take 81 mg by mouth daily.   Marland Kitchen atorvastatin (LIPITOR) 10 MG tablet Take 1 tablet (10 mg) by mouth daily.   Marland Kitchen BIOTIN PO Take 1 capsule by mouth daily.   . Calcium Carbonate-Vit D-Min (CALCIUM 1200 PO) Take 1 tablet by mouth daily.   . Cholecalciferol (VITAMIN D3) 50 MCG (2000 UT) capsule Take 1 capsule by mouth daily.   . Clobetasol Propionate (IMPOYZ) 0.025 % CREA Apply topically.   Marland Kitchen desvenlafaxine (PRISTIQ) 50 MG TB24 Take 1 tablet (50 mg) by mouth daily.   Marland Kitchen econazole (SPECTAZOLE) 1 % cream Apply 1 Application topically 2 times daily. Use a small amount as directed   . econazole (SPECTAZOLE) 1 % cream Apply 1 Application topically 2 times daily. Use a small amount as directed   . fluPHENAZine (PROLIXIN) 5 MG tablet Take 1 tablet (5 mg) by mouth daily.   Marland Kitchen gabapentin (NEURONTIN) 600 MG tablet Take 2 tablets (1,200 mg) by mouth at bedtime.   Marland Kitchen levothyroxine (SYNTHROID) 112 MCG tablet Take 112 mcg by mouth  daily.   Marland Kitchen levothyroxine (SYNTHROID) 125 MCG tablet TK 1 T PO QAM   . mirabegron (MYRBETRIQ) 50 MG ER tablet Take 50 mg by mouth daily.   Marland Kitchen omega-3 fatty acids, OTC, 1000 MG CAPS Take 1 g by mouth daily (with food).   . Psyllium (METAMUCIL FIBER PO) Take 1 capsule by mouth daily.   . QUEtiapine (SEROQUEL) 100 MG tablet Take 1 tablet (100 mg) by mouth 2 times daily.   . Red Yeast Rice Extract (RED YEAST RICE PO) Take 1 tablet by mouth daily.   . sertraline (ZOLOFT) 100 MG tablet Take 1 tablet (100 mg) by mouth daily.   . trihexyphenidyl (ARTANE) 1 MG tablet Take 1 each (1 mg) by mouth 2 times daily.   . trihexyphenidyl (ARTANE) 2 MG tablet TAKE 1/2 TABLET BY MOUTH TWICE DAILY   . vitamin D3 (VITAMIN D3) 1000 units capsule Take 1,000 Units by mouth daily.   . ziprasidone (GEODON) 80 MG capsule TK 1 C PO BID WF   . zonisamide (ZONEGRAN) 100 MG capsule Take 2 capsules (200 mg) by mouth 2  times daily.     No current facility-administered medications for this visit.         SOCIAL HISTORY  Social History     Socioeconomic History   . Marital status: Married     Spouse name: Not on file   . Number of children: Not on file   . Years of education: Not on file   . Highest education level: Not on file   Occupational History   . Not on file   Social Needs   . Financial resource strain: Not on file   . Food insecurity:     Worry: Not on file     Inability: Not on file   . Transportation needs:     Medical: Not on file     Non-medical: Not on file   Tobacco Use   . Smoking status: Former Smoker     Packs/day: 2.00     Years: 30.00     Pack years: 60.00   . Smokeless tobacco: Never Used   Substance and Sexual Activity   . Alcohol use: No     Frequency: Never   . Drug use: No   . Sexual activity: Never   Lifestyle   . Physical activity:     Days per week: Not on file     Minutes per session: Not on file   . Stress: Not on file   Relationships   . Social connections:     Talks on phone: Not on file     Gets together: Not on file      Attends religious service: Not on file     Active member of club or organization: Not on file     Attends meetings of clubs or organizations: Not on file     Relationship status: Not on file   . Intimate partner violence:     Fear of current or ex partner: Not on file     Emotionally abused: Not on file     Physically abused: Not on file     Forced sexual activity: Not on file   Other Topics Concern   . Not on file   Social History Narrative   . Not on file       FAMILY HISTORY  Family History   Problem Relation Name Age of Onset   . Other Mother          Wegener   . Cancer Brother          Leukemia   . Cancer Father          Leukemia   . Heart Disease Father     . Hypertension Father     . No Known Problems Sister     . Cancer Brother          Bladder Cancer       PROBLEM LIST  Patient Active Problem List   Diagnosis   . Schizoaffective disorder, bipolar type (CMS-HCC)   . Insomnia   . Morbid obesity (CMS-HCC)   . Tremor   . Osteoarthrosis   . Hypothyroidism   . Obsessive-compulsive disorder, unspecified type   . Anxiety   . Extrapyramidal and movement disorder        REVIEW OF SYSTEMS  Review of Systems   Constitutional: Negative for fatigue and fever.   HENT: Positive for sore throat. Negative for congestion.    Eyes: Negative.    Respiratory: Negative.    Cardiovascular: Negative.  Gastrointestinal: Negative.        PHYSICAL EXAMINATION  BP 143/71   Pulse 74   Temp 97.7 F (36.5 C) (Temporal)   Resp 20   Wt 127.3 kg (280 lb 8.6 oz)   LMP  (LMP Unknown)   BMI 43.94 kg/m   Physical Exam   Constitutional: She appears well-developed and well-nourished.   HENT:   Head: Normocephalic and atraumatic.   Eyes: Pupils are equal, round, and reactive to light.   Neck: Normal range of motion. Neck supple.   Cardiovascular: Normal rate and regular rhythm.   Pulmonary/Chest: Effort normal.   Abdominal: Soft. Bowel sounds are normal. Musculoskeletal: Normal range of motion.     Neurological: She is alert.   Skin: Skin  is warm and dry.   Nursing note and vitals reviewed.      LABS/IMAGING  Results for orders placed or performed in visit on 07/09/18   Lipid Panel   Result Value Ref Range    Cholesterol 240 (H) <200 mg/dL    HDL Cholesterol 79 > OR = 50 mg/dL    Triglycerides 62 <150 mg/dL    LDL-Cholesterol 146 (H) mg/dL (calc)    Chol/HDLC Ratio 3.0 <5.0 (calc)    Non-HDL Cholesterol 161 (H) <130 mg/dL (calc)   Random Urine Microalb/Creat Ratio Panel   Result Value Ref Range    Creatinine, Random Urine 131 20 - 275 mg/dL    Microalbumin Random 0.4 See Note: mg/dL    Microalbumin Ratio 3 <30 mcg/mg creat   Comprehensive Metabolic Panel   Result Value Ref Range    Glucose 99 65 - 99 mg/dL    BUN 26 (H) 7 - 25 mg/dL    Creatinine 0.80 0.60 - 0.93 mg/dL    eGFR non-Afr.American 75 > OR = 60 mL/min/1.13m    eGFR African American 87 > OR = 60 mL/min/1.770m   BUN/Creatinine Ratio 33 (H) 6 - 22 (calc)    Sodium 143 135 - 146 mmol/L    Potassium 4.2 3.5 - 5.3 mmol/L    Chloride 105 98 - 110 mmol/L    Carbon Dioxide 30 20 - 32 mmol/L    Calcium 9.8 8.6 - 10.4 mg/dL    Total Protein 7.0 6.1 - 8.1 g/dL    Albumin 4.2 3.6 - 5.1 g/dL    Globulin 2.8 1.9 - 3.7 g/dL (calc)    Albumin/Glob Ratio 1.5 1.0 - 2.5 (calc)    Bilirubin, Total 0.7 0.2 - 1.2 mg/dL    Alkaline Phos 92 37 - 153 U/L    AST (SGOT) 14 10 - 35 U/L    ALT (SGPT) 9 6 - 29 U/L   CBC w/ Diff   Result Value Ref Range    WBC 4.6 3.8 - 10.8 Thousand/uL    RBC 4.51 3.80 - 5.10 Million/uL    HGB 13.5 11.7 - 15.5 g/dL    HCT 40.4 35.0 - 45.0 %    MCV 89.6 80.0 - 100.0 fL    MCH 29.9 27.0 - 33.0 pg    MCHC 33.4 32.0 - 36.0 g/dL    RDW 13.0 11.0 - 15.0 %    PLT 182 140 - 400 Thousand/uL    MPV 11.7 7.5 - 12.5 fL    Abs Neutrophils 2,636 1,500 - 7,800 cells/uL    Abs Band Neutrophils CANCELED 0 - 750 cells/uL    Abs Metamyelocytes CANCELED 0 cells/uL    Abs Myelocytes CANCELED 0 cells/uL  Abs Promyelocytes CANCELED 0 cells/uL    Abs Lymphs 1,398 850 - 3,900 cells/uL    Abs Monocytes 359  200 - 950 cells/uL    Abs Eosinophils 189 15 - 500 cells/uL    Abs Basophils 18 0 - 200 cells/uL    Abs Blasts CANCELED 0 cells/uL    Abs NRBC 0 0 cells/uL    SEGS 57.3 %    BANDS CANCELED %    Metamyelocytes CANCELED %    Myelocytes CANCELED %    Promyelocytes CANCELED %    Lymps 30.4 %    Reactive Lymphs CANCELED 0 - 10 %    Monocytes 7.8 %    Eosinophils 4.1 %    Basophils 0.4 %    Blasts CANCELED %    NRBC CANCELED 0 /100 WBC    Comments CANCELED    Free Thyroxine, Blood   Result Value Ref Range    Free T4 1.2 0.8 - 1.8 ng/dL   TSH, Blood   Result Value Ref Range    TSH 0.67 0.40 - 4.50 mIU/L   Vitamin D, 25-OH Total   Result Value Ref Range    Vitamin D, 25-OH, Total 32 30 - 100 ng/mL   Glycosylated Hgb(A1C), Blood   Result Value Ref Range    Hgb A1C 5.1 <5.7 % of total Hgb       ASSESSMENT/ PLAN  1. Throat pain, r/o infection , symptomatic tx.    - Strep Culture Group A [place] eSwab  - Droplet PLUS Eye Protection Precautions  - Contact Precautions  - Coronavirus Disease 2019 (COVID-19) [place] Nasal-Pharyngeal; Future  - Coronavirus Disease 2019 (COVID-19) [place] Nasal-Pharyngeal    Emmit Alexanders, MD  INTERNAL MEDICINE.

## 2018-08-27 NOTE — Patient Instructions (Signed)
Rinse and gargle 2x a day with warm salt water and take Ibuprophen 400mg  2 - 3 x a day as needed for pain. Please return as needed if symptoms worsen or fail to improve

## 2018-08-29 LAB — GROUP A STREP CULTURE: Culture Result: NEGATIVE

## 2018-08-30 ENCOUNTER — Telehealth: Payer: Self-pay | Admitting: Internal Medicine

## 2018-08-30 NOTE — Telephone Encounter (Signed)
PATIENT IS CALLING STATED THAT SHE WOULD LIKE THE RESULTS OF THE CULTURE THAT DR TOOK ON 08/27/18 PLEASE CALL PATIENT [place] YOU

## 2018-08-30 NOTE — Telephone Encounter (Signed)
SPOKE TO PT AND SHE HAS BOTH RESULTS.  MS

## 2018-09-10 ENCOUNTER — Telehealth: Payer: Self-pay | Admitting: Dermatology

## 2018-09-10 NOTE — Telephone Encounter (Signed)
Requesting sooner appt than 10/07/18.

## 2018-09-11 NOTE — Telephone Encounter (Signed)
I have scheduled a sooner appointment for the [place] - NO FURTHER ACTION REQUIRED-PA 07/29

## 2018-09-16 ENCOUNTER — Ambulatory Visit: Payer: Medicare Other | Admitting: Dermatology

## 2018-09-17 ENCOUNTER — Encounter: Payer: Self-pay | Admitting: Dermatology

## 2018-09-17 ENCOUNTER — Ambulatory Visit (INDEPENDENT_AMBULATORY_CARE_PROVIDER_SITE_OTHER): Payer: Medicare Other | Admitting: Psychiatry

## 2018-09-17 ENCOUNTER — Ambulatory Visit (INDEPENDENT_AMBULATORY_CARE_PROVIDER_SITE_OTHER): Payer: Medicare Other | Admitting: Dermatology

## 2018-09-17 VITALS — Temp 97.3°F | Ht 67.0 in | Wt 280.0 lb

## 2018-09-17 DIAGNOSIS — F429 Obsessive-compulsive disorder, unspecified: Secondary | ICD-10-CM

## 2018-09-17 DIAGNOSIS — G4700 Insomnia, unspecified: Secondary | ICD-10-CM

## 2018-09-17 DIAGNOSIS — F419 Anxiety disorder, unspecified: Secondary | ICD-10-CM

## 2018-09-17 DIAGNOSIS — F250 Schizoaffective disorder, bipolar type: Secondary | ICD-10-CM

## 2018-09-17 DIAGNOSIS — B354 Tinea corporis: Secondary | ICD-10-CM

## 2018-09-17 DIAGNOSIS — G259 Extrapyramidal and movement disorder, unspecified: Secondary | ICD-10-CM

## 2018-09-17 DIAGNOSIS — B372 Candidiasis of skin and nail: Secondary | ICD-10-CM

## 2018-09-17 MED ORDER — TRIHEXYPHENIDYL HCL 2 MG OR TABS
1.0000 mg | ORAL_TABLET | Freq: Two times a day (BID) | ORAL | 1 refills | Status: DC
Start: 2018-09-17 — End: 2019-03-18

## 2018-09-17 MED ORDER — ARIPIPRAZOLE 30 MG OR TABS
30.0000 mg | ORAL_TABLET | Freq: Every day | ORAL | 1 refills | Status: DC
Start: 2018-09-17 — End: 2019-05-23

## 2018-09-17 MED ORDER — NYSTATIN 100000 UNIT/GM EX CREA
1.0000 | TOPICAL_CREAM | Freq: Two times a day (BID) | CUTANEOUS | 0 refills | Status: DC
Start: 2018-09-17 — End: 2018-09-17

## 2018-09-17 MED ORDER — LEVOTHYROXINE SODIUM 137 MCG OR TABS
ORAL_TABLET | ORAL | Status: DC
Start: 2018-09-09 — End: 2019-10-09

## 2018-09-17 MED ORDER — FLUCONAZOLE 150 MG OR TABS
150.0000 mg | ORAL_TABLET | Freq: Once | ORAL | 0 refills | Status: AC
Start: 2018-09-17 — End: 2018-09-17

## 2018-09-17 MED ORDER — TRIAMCINOLONE ACETONIDE 0.1 % EX CREA
1.00 | TOPICAL_CREAM | Freq: Two times a day (BID) | CUTANEOUS | Status: DC
Start: ? — End: 2018-09-17

## 2018-09-17 MED ORDER — QUETIAPINE FUMARATE 50 MG OR TABS
50.0000 mg | ORAL_TABLET | Freq: Four times a day (QID) | ORAL | 1 refills | Status: DC
Start: 2018-09-17 — End: 2019-01-13

## 2018-09-17 MED ORDER — ZIPRASIDONE HCL 80 MG OR CAPS
80.0000 mg | ORAL_CAPSULE | Freq: Every day | ORAL | 1 refills | Status: DC
Start: 2018-09-17 — End: 2018-11-11

## 2018-09-17 MED ORDER — NYSTATIN 100000 UNIT/GM EX CREA
1.0000 | TOPICAL_CREAM | Freq: Two times a day (BID) | CUTANEOUS | 2 refills | Status: DC
Start: 2018-09-17 — End: 2018-10-14

## 2018-09-17 MED ORDER — FLUPHENAZINE HCL 5 MG OR TABS
5.0000 mg | ORAL_TABLET | Freq: Every day | ORAL | 3 refills | Status: DC
Start: 2018-09-17 — End: 2018-12-13

## 2018-09-17 NOTE — Patient Instructions (Addendum)
It was nice to see you today!  You have intertrigo    Take the fluconazole once for one dose   Start the econazole cream twice a day  Start the nystatin cream (alone, not the cream with triamcinolone) to the area twice a day  Keep area clean and dry  Can consider zinc oxide barrier cream to areas to prevent moisture from getting in area

## 2018-09-17 NOTE — Progress Notes (Signed)
Outpatient Psychiatry Progress Note      Chief Complaint:   Regularly scheduled follow-up appointment    Identifying Information:  Julie Monroe is a 73 year old female who presents today for a follow-up appointment.     Interval History:  [facility] VIDEO VISIT Q:   Is patient in state of [place] Yes    Do you consent to proceed with the Video Visit today?: Yes      Pegi Disheris a5 year oldF with a past psychiatric history of schizoaffective d/o, bipolar type, OCD and unspecified insomnia who presents for follow-up. Pt reports mood, "Fine, I guess," and denies suicidal ideation, intent and plan. Pt denies associated depressive symptoms incl amotivation, anhedonia, decreased interest, decreased concentration and hopelessness. Pt reports stable anxiety. Pt denies auditory and visual hallucinations, and does not endorse obvious AEs. Pt denies current or recent decreased need for sleep or increased energy > 4 d, hyperverbality, racing thoughts, grandiosity and goal-directed bevavior c/w manic or hypomanic episode at this encounter. Pt reports compliance with psychotropic medications as directed, and denies AEs. Pt does, however, report "a little sad... and bored" in evening, which pt attributes to continued sheltering in place, decreased traveling and decreased walking. Encouraged walking, while reviewing public health guidelines for COVID-19, esp in morning. Encouraged increased day trips, esp as pt reported plans to do day trips have failed recently. Pt reports preference for trial'ing downtitration of quetiapine, due to concern for weight gain (after discussing risks, benefits and alternate treatments, which the patient appeared to understand).      Review of Systems:  Denies fever, chills, chest pain, dyspnea, dizziness, syncope, recent falls, headaches, nausea, and vomiting.      Current Outpatient Medications:   .  ARIPiprazole (ABILIFY) 30 MG tablet, Take 1 tablet (30 mg) by mouth daily., Disp: 90  tablet, Rfl: 2  .  aspirin 81 MG tablet, Take 81 mg by mouth daily., Disp: , Rfl:   .  atorvastatin (LIPITOR) 10 MG tablet, Take 1 tablet (10 mg) by mouth daily., Disp: 90 tablet, Rfl: 3  .  BIOTIN PO, Take 1 capsule by mouth daily., Disp: , Rfl:   .  Calcium Carbonate-Vit D-Min (CALCIUM 1200 PO), Take 1 tablet by mouth daily., Disp: , Rfl:   .  Cholecalciferol (VITAMIN D3) 50 MCG (2000 UT) capsule, Take 1 capsule by mouth daily., Disp: , Rfl:   .  Clobetasol Propionate (IMPOYZ) 0.025 % CREA, Apply topically., Disp: , Rfl:   .  desvenlafaxine (PRISTIQ) 50 MG TB24, Take 1 tablet (50 mg) by mouth daily., Disp: 90 tablet, Rfl: 3  .  econazole (SPECTAZOLE) 1 % cream, Apply 1 Application topically 2 times daily. Use a small amount as directed, Disp: 1 Tube, Rfl: 1  .  fluCONazole (DIFLUCAN) 150 MG tablet, Take 1 tablet (150 mg) by mouth once for 1 dose., Disp: 1 tablet, Rfl: 0  .  gabapentin (NEURONTIN) 600 MG tablet, Take 2 tablets (1,200 mg) by mouth at bedtime., Disp: 180 tablet, Rfl: 3  .  levothyroxine (SYNTHROID) 125 MCG tablet, TK 1 T PO QAM, Disp: , Rfl:   .  levothyroxine (SYNTHROID) 137 MCG tablet, TK 1 T PO D, Disp: , Rfl:   .  mirabegron (MYRBETRIQ) 50 MG ER tablet, Take 50 mg by mouth daily., Disp: 30 tablet, Rfl: 3  .  nystatin (MYCOSTATIN) 100000 UNIT/GM cream, Apply 1 Application topically 2 times daily. Apply to affected area twice a day, Disp: 120 g, Rfl: 2  .  omega-3 fatty acids, OTC, 1000 MG CAPS, Take 1 g by mouth daily (with food)., Disp: , Rfl:   .  Psyllium (METAMUCIL FIBER PO), Take 1 capsule by mouth daily., Disp: , Rfl:   .  QUEtiapine (SEROQUEL) 100 MG tablet, Take 1 tablet (100 mg) by mouth 2 times daily., Disp: 180 tablet, Rfl: 0  .  Red Yeast Rice Extract (RED YEAST RICE PO), Take 1 tablet by mouth daily., Disp: , Rfl:   .  sertraline (ZOLOFT) 100 MG tablet, Take 1 tablet (100 mg) by mouth daily., Disp: 90 tablet, Rfl: 3  .  triamcinolone (KENALOG) 0.1 % cream, Apply 1 Application  topically 2 times daily. Apply a thin layer as directed, Disp: , Rfl:   .  trihexyphenidyl (ARTANE) 1 MG tablet, Take 1 each (1 mg) by mouth 2 times daily., Disp: 60 tablet, Rfl: 3  .  vitamin D3 (VITAMIN D3) 1000 units capsule, Take 1,000 Units by mouth daily., Disp: , Rfl:   .  ziprasidone (GEODON) 80 MG capsule, TK 1 C PO BID WF, Disp: , Rfl:     Vital Signs:  LMP  (LMP Unknown)     Mental Status Exam:    Appearance: Fair hygiene, grooming.  Behavior: Cooperative. Calm.  Gait: No gait abnormality.  Muscle Strength/Tone:No PMR/PMA  Speech: Normal rate, tone and volume  Mood: "Fine, I guess"  Affect:Mildly flattened  Thought Process: Linear, goal-directed   - Associations: Intact  Thought Content: No suicidal or homicidal ideation, intent and plan. No auditory or visual hallucinations.  Attention Span and Concentration: Mildly distracted in conversation  Language: Able to name objects  Fund of Knowledge: Average  Memory: Both recent and remote memories are intact in context of conversation  Orientation: Intact to person, place and situation  Insight/Judgment:Improved    LABS/ OTHER STUDIES    CHEM 7  Lab Results   Component Value Date/Time    NA 143 07/09/2018 07:41 AM    K 4.2 07/09/2018 07:41 AM    CL 105 07/09/2018 07:41 AM    BUN 26 (H) 07/09/2018 07:41 AM    GLU 99 07/09/2018 07:41 AM    [place] 9.8 07/09/2018 07:41 AM     LFT's  Lab Results   Component Value Date/Time    AST 14 07/09/2018 07:41 AM    ALT 9 07/09/2018 07:41 AM    ALB 4.2 07/09/2018 07:41 AM    TP 7.0 07/09/2018 07:41 AM     CBC  Lab Results   Component Value Date/Time    WBC 4.6 07/09/2018 07:41 AM    HGB 13.5 07/09/2018 07:41 AM    HCT 40.4 07/09/2018 07:41 AM    PLT 182 07/09/2018 07:41 AM     LIPID PANEL  Lab Results   Component Value Date/Time    CHOL 240 (H) 07/09/2018 07:41 AM    HDL 79 07/09/2018 07:41 AM    TRIG 62 07/09/2018 07:41 AM     HGBA1c  No components found for: A1C;3  Miscellaneous   TSH   Date Value Ref Range Status      07/09/2018 0.67 0.40 - 4.50 mIU/L Final        Scales:   No flowsheet data found.  No flowsheet data found.    AIMS (02/07/18):  Facial & Oral Movements: 6  Extremity movements: 3  Trunk Movement: 0  Global Judgment: 1  Total: 10    AIMS (02/20/17):  Facial & Oral Movements: 0  Extremity movements:  2  Trunk Movement: 0  Global Judgment: 1  Total: 3    Assessment:   Julie Disheris a72 year oldF with a past psychiatric history of schizoaffective d/o, bipolar type, OCD and unspecified insomnia who presents for follow-up.Pt demonstrates chronic, stable psychotic delusion, with some evidence of cognitive decline, thought process disorganization and (previously observed) affective blunting.Pt reports stably improved mood, associated depressive symptoms, anxiety and psychotic symptoms (though pt previously endorsed stable psychotic delusion, not elicited at this encounter, in addition to some evidence of cognitive decline, thought process disorganization and affective blunting). Pt reports continued sheltering in place and decreased physical and social activities, however, are stressor. Pt encouraged to increase day trips and walks (while observing social distancing and masking), and plan to trial downtitration of quetiapine with monitoring of worsening depression and/or rebound insomnia.    She is assessed to be at alow acute risk of suicide.   Clinical Global Impression - Severity:4= Moderately ill  :Suggested Guidelines= Overt symptomatology causing noticeable, but modest, functional impairment. .    Diagnosis/Diagnoses:     ICD-10-CM ICD-9-CM   1. Schizoaffective disorder, bipolar type (CMS-HCC) F25.0 295.70   2. Anxiety F41.9 300.00   3. Extrapyramidal and movement disorder G25.9 333.90   4. Morbid obesity (CMS-HCC) E66.01 278.01   5. Obsessive-compulsive disorder, unspecified type F42.9 300.3   6. Insomnia, unspecified type G47.00 780.52     Plan:  Medication Management: Risks, benefits, and  alternatives of medications were discussed today with Wynona Caneshristine and she consents to regimen.  #Psychosis:   Change to quetiapine 50 mg PO QID  ? Plan to downtitrate, as tolerated, with monitoring of rebound insomnia and worsening mood   Continue fluphenazine 5 mg PO qDaily   Continue aripiprazole 30 mgPOqDaily   Continue ziprasidone 80 mgPOqDaily  #EPS   Continuetrihedyphenidyl(Artane)1 mg POBIDfor EPS  # Anxiety, OCD   Continue gabapentin 1200 mgPO QHS forinsomnia   Continue desvenlafaxine 50 mgqDaily   Continuesertraline 100 mg qDaily     D/c zonisamide 100 mg PO qDaily for mood stabilization (off-label)    In-session psychotherapy:  I spent 21 minutes providing psychotherapy for this encounter.  Treatment modality(ies) employed: Psychoeducation and supportive  Progress to date: Improving  -- Encouraged walking, while reviewing public health guidelines for COVID-19, esp in morning     Encouraged increased day trips, esp as pt reported plans to do day trips have failed recently  -- Supportive psychotherapy provided   -- Discussed COVID-19 related lifestyle changes, and, in this case, positive impact on relationship w/ pt husband  -- Discussed possible actions to take in response to increased fluphenazine pricing (described above)  -- Discussed changing perception of COVID-19 associated quarantine      Discussed plan for excursion, to approximate previously regular extended trips  -- Discussed COVID-19 associated quarantine, and impact on livelihood and daily activities  -- Discussed financial stressor of recently increased medication prices, and offered alternatives to seeking better coverage in addition to [employer] coupon  -- Psychoeducation regarding current regimen, esp sleep aids, and AEs of antipsychotics incl weight gain  -- Psychoeducation regarding current regimen, and possible future plans to reduce polypharmacy to meet pt therapeutic goals to reduce weight gain and sedation  -- F/u  SMART goals  -- Walk of any length at least one per week  -- Go to gym of any length at least once per week  -- Extensive psychoeducation re clozapine and cognitive effects of anticholinergics discussed  Treatment plan: Continue psychoeducation and  supportive  Level of Care: Pt currently does not meet criteria for inpatient psychiatric hospitalization.    Note Author:  Newt Lukes, MD  Resident/Fellow Physician

## 2018-09-17 NOTE — Addendum Note (Signed)
Addended by: Eugenie Norrie on: 09/17/2018 05:55 PM     Modules accepted: Orders

## 2018-09-17 NOTE — Progress Notes (Signed)
Department of Dermatology  [facility]  PATIENT: Julie Monroe  MRN: [id number]  DOB: February 17, 1945  DATE OF SERVICE: 09/17/2018    DERMATOLOGY NEW PATIENT VISIT     REFERRING PRACTITIONER: Self, Referred  PRIMARY CARE PROVIDER: Leonia ReaderSasson, Bobby  CHIEF COMPLAINT:   Chief Complaint   Patient presents with   . Rash     Rash in groin area and under breast        Subjective:     Julie Monroe is a 73 year old female with schizoaffective disorder who was seen in clinic as new patient for rash under pannus and involving groin and inframammary area that has been present for months, intermittently. Her husband states they have used the econazole with improvement but then recurs, most recently used the nystatin/tac combination with some improvement. Wondering what they can do to get rid of it. Gets itchy at time. No other skin concerns and feels well. [facility].    Denies history of DM2.     Personal history of skin cancer: none  Family history of skin cancer: no family history of melanoma     Past Medical History:   Diagnosis Date   . Bipolar affect, depressed (CMS-HCC)    . Chronic back pain    . Hypothyroidism    . Insomnia    . OAB (overactive bladder)    . OCD (obsessive compulsive disorder)    . Osteopenia    . Schizophrenia (CMS-HCC)       Past Surgical History:   Procedure Laterality Date   . Biilat. Knee replacement     . Rt toe surgery     . TOTAL KNEE ARTHROPLASTY Bilateral      Family History   Problem Relation Name Age of Onset   . Other Mother          Wegener   . Cancer Brother          Leukemia   . Cancer Father          Leukemia   . Heart Disease Father     . Hypertension Father     . No Known Problems Sister     . Cancer Brother          Bladder Cancer     Social History     Socioeconomic History   . Marital status: Married     Spouse name: Not on file   . Number of children: Not on file   . Years of education: Not on file   . Highest education level: Not on file      Occupational History   . Not on file   Social Needs   . Financial resource strain: Not on file   . Food insecurity:     Worry: Not on file     Inability: Not on file   . Transportation needs:     Medical: Not on file     Non-medical: Not on file   Tobacco Use   . Smoking status: Former Smoker     Packs/day: 2.00     Years: 30.00     Pack years: 60.00   . Smokeless tobacco: Never Used   Substance and Sexual Activity   . Alcohol use: No     Frequency: Never   . Drug use: No   . Sexual activity: Never   Lifestyle   . Physical activity:     Days per week:  Not on file     Minutes per session: Not on file   . Stress: Not on file   Relationships   . Social connections:     Talks on phone: Not on file     Gets together: Not on file     Attends religious service: Not on file     Active member of club or organization: Not on file     Attends meetings of clubs or organizations: Not on file     Relationship status: Not on file   . Intimate partner violence:     Fear of current or ex partner: Not on file     Emotionally abused: Not on file     Physically abused: Not on file     Forced sexual activity: Not on file   Other Topics Concern   . Not on file   Social History Narrative   . Not on file     Outpatient Medications Marked as Taking for the 09/17/18 encounter (Office Visit) with Crosby Oyster, MD   Medication Sig Dispense Refill   . triamcinolone (KENALOG) 0.1 % cream Apply 1 Application topically 2 times daily. Apply a thin layer as directed       No Known Allergies    Review of Systems:  Constitutional: negative for fevers, chills, headache, cough, joint pain, and night sweats   Skin:  Otherwise negative  Objective:   General: Pleasant, in no acute distress, appears stated age  Psychiatric: Mood and affect are within normal limits. Alert and oriented x 3  Skin Exam:  Skin Type: II  Full body skin exam performed including scalp, face, conjunctiva, lips, oral mucosa, neck, chest, back, abdomen, right upper, left  upper, right lower, left lower extremity, hands, nails, and the following was noted:    Exam somewhat limited by body habitus  Erythematous annular scaling plaques with fine white scale and areas of maceration in crural creases and under pannus  Erythematous papules coalescing into more confluent plaques, some eroded in inframammary area and under pannus, with satellite lesions    Assessment/Plan     1. Candida intertrigo with component of   2. Tinea corporis   - Discussed diagnosis and treatment options   - Keep areas clean and dry as much as possible  - Treat area with econazole and nystatin (discussed to stop nystatin-triamcinolone combination products and start nystatin only, rx sent)  - One time dose of oral fluconazole 150 mg  - Start barrier past with zinc oxide     The above plan of care, diagnosis, orders, and follow-up were discussed with the patient.  Questions related to this recommended plan of care were answered.    RTC 8 weeks or sooner prn    Author:  Crosby Oyster, MD 09/17/2018 5:48 PM

## 2018-09-18 NOTE — Progress Notes (Signed)
Attending Attestation:  I was present and participated in the key and critical portions of the exam. The resident and I discussed the case together and agree with the findings and plan as documented. I provided supervision for supportive and other psychotherapy modalities employed to augment medication management. My additions or revisions are included in the record as needed.     Lakesa Coste M Kirk Basquez, MD  Attending Physician

## 2018-09-19 ENCOUNTER — Ambulatory Visit: Payer: Medicare Other | Admitting: Dermatology

## 2018-09-20 ENCOUNTER — Telehealth: Payer: Self-pay | Admitting: Internal Medicine

## 2018-09-20 ENCOUNTER — Ambulatory Visit: Payer: Medicare Other | Admitting: Psychiatry

## 2018-09-20 NOTE — Telephone Encounter (Signed)
He requests back to back on the same day appt for him and his wife on 10/30/18 or 10/31/18. Epic shows no appt available on these days. Please call him back. Thank you

## 2018-09-20 NOTE — Telephone Encounter (Signed)
Tori, Please encourage them to schedule the next available with Dr. Leonette Monarch and explain her video vs in-person scheduling. We can place them on a wait list for sooner back to back appt, but there is video availability on 09/17. My understanding is that they've refused video visits in the past. If this is still the case, they need to schedule next available on Tuesday/Wednesday since they want back-to-back, and we'll add to Dr. Eliseo Squires wait list.    They both saw Dr. Leonette Monarch just this past June, so they are not new patients. Thank you.

## 2018-09-20 NOTE — Telephone Encounter (Signed)
Pt requesting to see Dr. Leonette Monarch. She will like re-establish care with her. Please follow-up, thank you. Needs double appointment for herself and husband.

## 2018-09-20 NOTE — Telephone Encounter (Signed)
Duplicate message. See previous phone note.

## 2018-09-23 ENCOUNTER — Telehealth: Payer: Self-pay | Admitting: Internal Medicine

## 2018-09-23 NOTE — Telephone Encounter (Signed)
States that Md have her lab orders to [facility] but the lab orders have no date on them will this still be ok?

## 2018-09-24 NOTE — Telephone Encounter (Signed)
Julie Monroe, Please let her know this will be fine and okay for her to get drawn. Thank you.

## 2018-09-24 NOTE — Telephone Encounter (Signed)
Left Patient a Voicemail MSG  to confirm Lab orders ok to do and new [facility] orders mailed to patient home address [place] ) today 09-24-2018. ( A.N. )

## 2018-09-26 NOTE — Telephone Encounter (Signed)
Patient and spouse are both scheduled on 12/03/2018 back to back.

## 2018-09-30 ENCOUNTER — Encounter: Payer: Self-pay | Admitting: Psychiatry

## 2018-10-03 ENCOUNTER — Encounter: Payer: Self-pay | Admitting: Dermatology

## 2018-10-03 ENCOUNTER — Telehealth: Payer: Self-pay | Admitting: Internal Medicine

## 2018-10-03 MED ORDER — FLUCONAZOLE 150 MG OR TABS
150.0000 mg | ORAL_TABLET | Freq: Once | ORAL | 0 refills | Status: AC
Start: 2018-10-03 — End: 2018-10-03

## 2018-10-03 NOTE — Telephone Encounter (Signed)
Patient calling stating she is returning Leslie's call . Please call back to assist . Thank you

## 2018-10-03 NOTE — Progress Notes (Signed)
Was speaking with patient's husband about his culture results and he reported his wife benefited significantly from the fluconazole but there are still a few red areas. Spoke to the patient. Will repeat fluconazole for 1 more dose, rx sent.

## 2018-10-04 ENCOUNTER — Telehealth: Payer: Self-pay | Admitting: Dermatology

## 2018-10-04 NOTE — Telephone Encounter (Signed)
Caller requesting to know if it is okay for her to take the flu vaccine with her diagnosis. Please assist.

## 2018-10-04 NOTE — Telephone Encounter (Signed)
Spoke with patient who also has her spouse there with her. Offered them back-to-back appts on Tuesday, 08/25, with Dr. Leonette Monarch per their earlier request, and they declined as they've not gotten there labs drawn yet. Explained if they get their labs done today, results should be back by Tuesday and she stated that she'd already eaten. Explored the option of them going to the outpatient lab in [facility] for testing tomorrow and results should be in, and they again declined. Stated they'll just keep their October appts as scheduled. Very appreciative.

## 2018-10-07 ENCOUNTER — Ambulatory Visit: Payer: Medicare Other | Admitting: Dermatology

## 2018-10-07 NOTE — Telephone Encounter (Signed)
10/07/18- Left patient voicemail to notify yes Dr. Thornell Mule recommends getting flu vaccine. nmp      Crosby Oyster, MD  Carlton Adam   Caller: Unspecified (3 days ago, 2:37 PM)            Please let her know that yes, I recommend the flu vaccine. Thank you!    Previous Messages    ----- Message -----   From: Carlton Adam   Sent: 10/04/2018  4:14 PM PDT   To: Crosby Oyster, MD       ----- Message -----   From: Mena Pauls, [place]   Sent: 10/04/2018  2:44 PM PDT   To: [facility]       ----- Message -----   From: Weber Cooks   Sent: 10/04/2018  2:37 PM PDT   To: [facility]

## 2018-10-08 ENCOUNTER — Ambulatory Visit: Payer: Medicare Other | Admitting: Psychiatry

## 2018-10-10 ENCOUNTER — Encounter: Payer: Self-pay | Admitting: Internal Medicine

## 2018-10-10 ENCOUNTER — Ambulatory Visit: Payer: Medicare Other | Attending: Internal Medicine | Admitting: Internal Medicine

## 2018-10-10 DIAGNOSIS — F250 Schizoaffective disorder, bipolar type: Secondary | ICD-10-CM | POA: Insufficient documentation

## 2018-10-10 DIAGNOSIS — E7800 Pure hypercholesterolemia, unspecified: Secondary | ICD-10-CM

## 2018-10-10 DIAGNOSIS — M1990 Unspecified osteoarthritis, unspecified site: Secondary | ICD-10-CM | POA: Insufficient documentation

## 2018-10-10 DIAGNOSIS — Z6841 Body Mass Index (BMI) 40.0 and over, adult: Secondary | ICD-10-CM | POA: Insufficient documentation

## 2018-10-10 DIAGNOSIS — E039 Hypothyroidism, unspecified: Secondary | ICD-10-CM | POA: Insufficient documentation

## 2018-10-10 HISTORY — DX: Pure hypercholesterolemia, unspecified: E78.00

## 2018-10-10 MED ORDER — ORLISTAT 120 MG OR CAPS
120.0000 mg | ORAL_CAPSULE | Freq: Three times a day (TID) | ORAL | 3 refills | Status: DC
Start: 2018-10-10 — End: 2019-09-16

## 2018-10-10 MED ORDER — FLUCONAZOLE 150 MG OR TABS
ORAL_TABLET | ORAL | Status: DC
Start: 2018-10-03 — End: 2018-10-10

## 2018-10-10 NOTE — Patient Instructions (Addendum)
Take an Orlistat pill 30 minutes before a meal. Take a multivitamin at night.   Start walking 15 minutes each day. Gradually increase by 5 minutes each week to goal of 30 minutes each day.  Limit eating between the hours of 10 am to 6 pm  We will see you again in 3 months.

## 2018-10-10 NOTE — Progress Notes (Signed)
Bariatric Medicine consultation  PCP: Dr. Leonia Reader   Referred by: Dr. Leonia Reader      Date:10/10/2018    Julie Monroe is a 73 year old female with hx of schizoaffective disorder and hypothyroidism who is here for evaluation of the above chief complaint: Bariatrics / Weight loss management.     Severe obesity and following comorbidities    Comorbidities   No Hx Type 2 Diabetes (A1c 5.1 on 07/09/18) or HTN.   No Hx of stroke, MI, or CAD.   +Osteoarthritis, s/p bilateral TKA.     Weights  Birthweight: not sure  Highest weight: ~300 lb in 1995.   Current weight: 280 lb  Goal weight: 180 lb    Diet  Receives meals from food bank, a box with frozen vegetables and protein (ie, chicken, fish), some fruits, carbs (bread, cake, pie).   BF: No breakfast food.   Usually eats 1 big meal a day around 3 PM.   Snacks on cookies, chips, fruits.   Fluids: morning coffee with cream/sugar, diet coke 750 cc, 1 glass of water, juice.      Exercise  Minimal - not much walking. Limited not due to inability but lack of motivation.   Still does the laundry, dishes, general clean up around the house  Previously had a gym membership one year ago, but was mainly visiting to use the sauna.     Weight loss methods trialed  Atkins low carb diet in 1980s  500 calories / beta hCG shots in 1970s     Per patient, she is not interested in any minimally invasive weight loss surgeries. She states that she tends to overeat/binge eat at times.  She does not have any purging behaviors. She is also on several antipsychotics (Seroquel, Geodon, Abilify) for her schizoaffective disorder/insomnia. Pt's schizoaffective disorder is stable - no depression, anxiety, hallucinations, hypomania/mania, paranoid ideations. Patient also is on Levothyroxine for hypothyroidism; last TSH in 06/2018 wnl.     Past Medical and Surgical History:  Past Medical History:   Diagnosis Date   . Bipolar affect, depressed (CMS-HCC)    . Chronic back pain    . Hypothyroidism    .  Insomnia    . OAB (overactive bladder)    . OCD (obsessive compulsive disorder)    . Osteopenia    . Pure hypercholesterolemia 10/10/2018   . Schizophrenia (CMS-HCC)      Past Surgical History:   Procedure Laterality Date   . Biilat. Knee replacement     . Rt toe surgery     . TOTAL KNEE ARTHROPLASTY Bilateral          Allergies:  No Known Allergies    Medications:  Current Outpatient Medications   Medication Sig Dispense Refill   . ARIPiprazole (ABILIFY) 30 MG tablet Take 1 tablet (30 mg) by mouth daily. 90 tablet 1   . aspirin 81 MG tablet Take 81 mg by mouth daily.     Marland Kitchen atorvastatin (LIPITOR) 10 MG tablet Take 1 tablet (10 mg) by mouth daily. 90 tablet 3   . BIOTIN PO Take 1 capsule by mouth daily.     . Calcium Carbonate-Vit D-Min (CALCIUM 1200 PO) Take 1 tablet by mouth daily.     . Cholecalciferol (VITAMIN D3) 50 MCG (2000 UT) capsule Take 1 capsule by mouth daily.     . Clobetasol Propionate (IMPOYZ) 0.025 % CREA Apply topically.     Marland Kitchen desvenlafaxine (PRISTIQ) 50 MG TB24 Take 1  tablet (50 mg) by mouth daily. 90 tablet 3   . econazole (SPECTAZOLE) 1 % cream Apply 1 Application topically 2 times daily. Use a small amount as directed 1 Tube 1   . fluPHENAZine (PROLIXIN) 5 MG tablet Take 1 tablet (5 mg) by mouth daily. 90 tablet 3   . gabapentin (NEURONTIN) 600 MG tablet Take 2 tablets (1,200 mg) by mouth at bedtime. 180 tablet 3   . levothyroxine (SYNTHROID) 125 MCG tablet TK 1 T PO QAM     . levothyroxine (SYNTHROID) 137 MCG tablet TK 1 T PO D     . mirabegron (MYRBETRIQ) 50 MG ER tablet Take 50 mg by mouth daily. 30 tablet 3   . nystatin (MYCOSTATIN) 100000 UNIT/GM cream Apply 1 Application topically 2 times daily. Apply to affected area twice a day 120 g 2   . orlistat (XENICAL) 120 MG capsule Take 1 capsule (120 mg) by mouth 3 times daily (with meals). 90 capsule 3   . quetiapine (SEROQUEL) 50 MG tablet Take 1 tablet (50 mg) by mouth 4 times daily. 360 tablet 1   . sertraline (ZOLOFT) 100 MG tablet Take 1  tablet (100 mg) by mouth daily. 90 tablet 3   . trihexyphenidyl (ARTANE) 2 MG tablet Take 0.5 tablets (1 mg) by mouth 2 times daily. 90 tablet 1   . ziprasidone (GEODON) 80 MG capsule Take 1 capsule (80 mg) by mouth daily with dinner. 90 capsule 1     No current facility-administered medications for this visit.        Social History:  Social History     Socioeconomic History   . Marital status: Married     Spouse name: Not on file   . Number of children: Not on file   . Years of education: Not on file   . Highest education level: Not on file   Occupational History   . Not on file   Tobacco Use   . Smoking status: Former Smoker     Packs/day: 2.00     Years: 30.00     Pack years: 60.00   . Smokeless tobacco: Never Used   Substance and Sexual Activity   . Alcohol use: No     Frequency: Never   . Drug use: No   . Sexual activity: Never   Social Activities of Daily Living Present   . Not on file   Social History Narrative   . Not on file       Family History:  Family History   Problem Relation Name Age of Onset   . Other Mother          Wegener   . Cancer Brother          Leukemia   . Cancer Father          Leukemia   . Heart Disease Father     . Hypertension Father     . No Known Problems Sister     . Cancer Brother          Bladder Cancer       Review of Systems:  General -denies any significant weight change.  Appetite.  No fever no fatigue  HEENT no discharge from the ear.  No snoring.  No sore throat or reduced hearing  EYE-No blurred vision or double vision.   Cardiac-no chest pain, shortness of breath, orthopnea or PND.  Denies any palpitations or orthostatic symptoms  Pulmonary-no cough, shortness of breath.  No wheezing.  No hemoptysis.  No pleuritic type chest pain  GI-no nausea, vomiting.  No acid reflux symptoms.  No constipation or diarrhea.  No hematemesis or hematochezia  GU-no dysuria.  No frequency or urgency.  No hematuria.  No loin pain  Neuro -no headache, seizures.  No tremors.  No weakness or loss  of sensation  Psych-mood stable.  No sleep disturbance.  No hallucinationbn      Physical Exam:   Vitals noted and stable  Weight today was 127.1 kilogram.  BMI 43.8   HEENT normo cephalic, atraumatic  Neck -  neck is supple , full adenopathy or enlarged goiter  Cardiac- 3/6 systolic murmur best heard in upper right sternal border. Normal first and second heart sounds.   Lung-trachea was midline, good air entry bilaterally, vesicular breath sounds, no wheezing, no rhonchi, no crackles  Abdomen- obese, soft, nontender, not distended.  No palpable masses. No hepato splenomegaly.  Bowel sounds were normal  Extremities-no edema, peripheral cyanosis or clubbing.  Dorsalis pedis pulse was 3 +and symmetrical  Neuroexam -alert oriented to time, place and person.  Speech was normal, gait was normal.  No focal neurological deficit   Mood was appropriate.      Labs and Other Data:      Hgb A1C   Date Value Ref Range Status   07/09/2018 5.1 <5.7 % of total Hgb Final     Comment:     For the purpose of screening for the presence of  diabetes:     <5.7%       Consistent with the absence of diabetes  5.7-6.4%    Consistent with increased risk for diabetes              (prediabetes)  > or =6.5%  Consistent with diabetes     This assay result is consistent with a decreased risk  of diabetes.     Currently, no consensus exists regarding use of  hemoglobin A1c for diagnosis of diabetes in children.     According to American Diabetes Association (ADA)  guidelines, hemoglobin A1c <7.0% represents optimal  control in non-pregnant diabetic patients. Different  metrics may apply to specific patient populations.   Standards of Medical Care in Diabetes(ADA).             TSH   Date Value Ref Range Status   07/09/2018 0.67 0.40 - 4.50 mIU/L Final   ; fT4:   Free T4   Date Value Ref Range Status   07/09/2018 1.2 0.8 - 1.8 ng/dL Final           Assessment and Care Plan:    Problem List Items Addressed This Visit        Cardiovascular    Pure  hypercholesterolemia  Lipids not at goal. Recently started on Atorvastatin 10 mg daily.   The 10-year ASCVD risk score Denman George DC Montez Hageman., et al., 2013) is: 7%    Values used to calculate the score:      Age: 83 years      Sex: Female      Is Non-Hispanic African American: No      Diabetic: No      Tobacco smoker: No      Systolic Blood Pressure: 110 mmHg      Is BP treated: No      HDL Cholesterol: 79 mg/dL      Total Cholesterol: 240 mg/dL  - C/w Atorvastatin 10 mg daily. PCP to titrate.  Endocrine    Hypothyroidism  TSH wnl.   - C/w Levothyroxine 137 mcg daily.        Musculoskeletal and Integumentary    Osteoarthritis  S/p bilateral TKA. Functional. No knee pain or impairment in gait.            Behavioral Health    Schizoaffective disorder, bipolar type (CMS-HCC)  Stable. No depression, anxiety, hallucinations, hypomania/mania, paranoid ideations, sleep disturbances.   - C/w psych meds as prescribed.  - Continue FU with [facility].   Unfortunately patient is on several atypical antipsychotics which make it difficult for her to lose weight       Other    Morbid obesity (CMS-HCC) - Primary  Morbidly obese with BMI of 44. No history of diabetes, HTN, CAD, CVA. Comorbidities include hyperlipidemia and osteoarthritis, s/p bl TKA (see above). Pt is on multiple atypical antipsychotics which can contribute to appetite stimulation and weight gain. She also has limited food options and is reliant on food bank for meals, which seems to include a heavy amount of carbohydrate-rich foods. She also endorses a sedentary lifestyle w/ no exercise beyond completing household chores. She is not interested in minimally invasive weight loss surgeries.  - Limit eating to between 10 am to 6 pm daily.  Discussed benefits of intermittent fasting extensively.  Discussed also options for monitoring calorie intake.  Discussed eating in the method of Mysmall plate  Discussed starting walking every day for 10 minutes and gradually increase it  every week by 5 minutes to get 30 minutes of walking daily  - Take Orlistat 120 mg capsule 1/2 hour before each meal. Cautions provided about potential oily/fatty stools when eating a meal heavy in fats. Recommended supplementation of Multivitamin at night.   - Encouraged daily working. Start walking 15 minutes each day. Gradually increase by 5 minutes each week to goal of 30 minutes each day.  - RTC in 3 months.               This plan and alternatives have been discussed with the patient and/or surrogate.      Future Appointments   Date Time Provider Department Center   10/14/2018  9:30 AM Dennie BibleMoskowitz, Dena E, MD [facility] [place]   10/18/2018  1:30 PM Park, Caren MacadamAndrew Chunkil, MD [place] [facility] [id number]   10/25/2018 11:45 AM Karma GreaserKraus, Christina Nicole, MD [facility] [place]   12/03/2018  9:20 AM Leonia ReaderSasson, Bobby, MD [place] PL[facility]INTMD [facility]   12/03/2018  1:00 PM Park, Caren MacadamAndrew Chunkil, [facility] [place]       Patient Instructions   Take an Orlistat pill 30 minutes before a meal. Take a multivitamin at night.   Start walking 15 minutes each day. Gradually increase by 5 minutes each week to goal of 30 minutes each day.  Limit eating between the hours of 10 am to 6 pm  We will see you again in 3 months.     I spent more than 65 minutes with patient counseling and coordinating care. More than 50% of time was spent counselling, regarding life style changes for weight loss; increasing physical activity. Discussed options for weight loss medications, and bariatric procedures if needed.      Voice Recognition Disclaimer - Due to possible errors in transcription, there may be errors in documentation: Due to voice recognition software, sound alike and misspelled words may be contained in the documentation. Portions of this chart may have been created with M-Modal Fluency Direct Voice Recognition Software. Occasional wrong-word or  sound-alike substitutions may have occurred due to the inherent limitations of voice recognition  software. Please read the chart carefully and recognize, using context, where these substitutions may have occurred.            Note Author: Theadore NanFrederick Chua, MD, PGY-1, Internal Medicine   10/10/18, 3:09 PM      .  Patient seen and examined with Resident. I spoke to the patient and  examined the patient myself. The assessment and plan was made as a team. I have edited and updated the notes and the final note reflects this edited version.    B.Alexes Lamarque, MD

## 2018-10-11 ENCOUNTER — Encounter: Payer: Self-pay | Admitting: Psychiatry

## 2018-10-12 ENCOUNTER — Encounter: Payer: Self-pay | Admitting: Psychiatry

## 2018-10-12 DIAGNOSIS — F419 Anxiety disorder, unspecified: Secondary | ICD-10-CM

## 2018-10-14 ENCOUNTER — Encounter: Payer: Self-pay | Admitting: Dermatology

## 2018-10-14 ENCOUNTER — Encounter: Payer: Self-pay | Admitting: Psychiatry

## 2018-10-14 ENCOUNTER — Encounter: Payer: Self-pay | Admitting: Urology

## 2018-10-14 ENCOUNTER — Ambulatory Visit (INDEPENDENT_AMBULATORY_CARE_PROVIDER_SITE_OTHER): Payer: Medicare Other | Admitting: Urology

## 2018-10-14 ENCOUNTER — Encounter: Payer: Medicare Other | Admitting: Urology

## 2018-10-14 ENCOUNTER — Ambulatory Visit (INDEPENDENT_AMBULATORY_CARE_PROVIDER_SITE_OTHER): Payer: Medicare Other | Admitting: Dermatology

## 2018-10-14 VITALS — BP 124/74 | HR 73 | Temp 97.0°F | Ht 67.0 in | Wt 280.2 lb

## 2018-10-14 VITALS — BP 123/77 | HR 78 | Temp 97.3°F

## 2018-10-14 DIAGNOSIS — N3941 Urge incontinence: Secondary | ICD-10-CM

## 2018-10-14 DIAGNOSIS — L304 Erythema intertrigo: Secondary | ICD-10-CM

## 2018-10-14 DIAGNOSIS — N3281 Overactive bladder: Secondary | ICD-10-CM

## 2018-10-14 DIAGNOSIS — B372 Candidiasis of skin and nail: Secondary | ICD-10-CM

## 2018-10-14 MED ORDER — NYSTATIN 100000 UNIT/GM EX CREA
1.0000 | TOPICAL_CREAM | Freq: Two times a day (BID) | CUTANEOUS | 2 refills | Status: DC
Start: 2018-10-14 — End: 2018-12-12

## 2018-10-14 MED ORDER — SERTRALINE HCL 100 MG OR TABS
100.0000 mg | ORAL_TABLET | Freq: Every day | ORAL | 3 refills | Status: DC
Start: 2018-10-14 — End: 2018-10-14

## 2018-10-14 MED ORDER — SERTRALINE HCL 100 MG OR TABS
150.0000 mg | ORAL_TABLET | Freq: Every day | ORAL | 3 refills | Status: DC
Start: 2018-10-14 — End: 2018-10-25

## 2018-10-14 MED ORDER — MIRABEGRON ER 50 MG PO TB24
50.0000 mg | ORAL_TABLET | Freq: Every day | ORAL | 3 refills | Status: DC
Start: 2018-10-14 — End: 2019-01-14

## 2018-10-14 MED ORDER — OXYBUTYNIN CHLORIDE 10 MG OR TB24
10.0000 mg | ORAL_TABLET | Freq: Every day | ORAL | 3 refills | Status: DC
Start: 2018-10-14 — End: 2019-01-14

## 2018-10-14 NOTE — Patient Instructions (Signed)
Follow up in 3 months, if you have any questions please give us a call at [phone]

## 2018-10-14 NOTE — Addendum Note (Signed)
Addended by: Dorna Leitz C on: 10/14/2018 11:41 AM     Modules accepted: Orders

## 2018-10-14 NOTE — Progress Notes (Signed)
Julie Monroe  [address]  [place], [place] [address]  [phone]      Urology Follow Up Note      HISTORY OF PRESENT ILLNESS:  Julie Monroe is a 73 year old-year-old woman here for follow up. She has a history of urgency urinary incontinence. Cystoscopy on12/16/19demonstrated no lesions. She has been treated withoxybutyninand with PTNS x 4 sessions. She was doing well for some time after the PTNS, but she stopped the treatments because she did not feel it was helping. Then her symptoms started to worsen again with urgency and urgency incontinence.    Today she reports she is currently not using any bladder medications. She has 1 iced coffee and 2 diet cokes in the morning after which she has frequency every 15 minutes. In the afternoon she has frequency every hour. The medications she has tried (both myrbetriq and oxybutynin) did not seem to help.     Review of Systems - no fevers or dysuria      Current Outpatient Medications:   .  ARIPiprazole (ABILIFY) 30 MG tablet, Take 1 tablet (30 mg) by mouth daily., Disp: 90 tablet, Rfl: 1  .  aspirin 81 MG tablet, Take 81 mg by mouth daily., Disp: , Rfl:   .  atorvastatin (LIPITOR) 10 MG tablet, Take 1 tablet (10 mg) by mouth daily., Disp: 90 tablet, Rfl: 3  .  BIOTIN PO, Take 1 capsule by mouth daily., Disp: , Rfl:   .  Calcium Carbonate-Vit D-Min (CALCIUM 1200 PO), Take 1 tablet by mouth daily., Disp: , Rfl:   .  Cholecalciferol (VITAMIN D3) 50 MCG (2000 UT) capsule, Take 1 capsule by mouth daily., Disp: , Rfl:   .  Clobetasol Propionate (IMPOYZ) 0.025 % CREA, Apply topically., Disp: , Rfl:   .  desvenlafaxine (PRISTIQ) 50 MG TB24, Take 1 tablet (50 mg) by mouth daily., Disp: 90 tablet, Rfl: 3  .  econazole (SPECTAZOLE) 1 % cream, Apply 1 Application topically 2 times daily. Use a small amount as directed, Disp: 1 Tube, Rfl: 1  .  fluPHENAZine (PROLIXIN) 5 MG tablet, Take 1 tablet (5 mg) by mouth daily., Disp: 90 tablet, Rfl: 3  .  gabapentin  (NEURONTIN) 600 MG tablet, Take 2 tablets (1,200 mg) by mouth at bedtime., Disp: 180 tablet, Rfl: 3  .  levothyroxine (SYNTHROID) 125 MCG tablet, TK 1 T PO QAM, Disp: , Rfl:   .  levothyroxine (SYNTHROID) 137 MCG tablet, TK 1 T PO D, Disp: , Rfl:   .  mirabegron (MYRBETRIQ) 50 MG ER tablet, Take 50 mg by mouth daily., Disp: 30 tablet, Rfl: 3  .  nystatin (MYCOSTATIN) 100000 UNIT/GM cream, Apply 1 Application topically 2 times daily. Apply to affected area twice a day, Disp: 120 g, Rfl: 2  .  orlistat (XENICAL) 120 MG capsule, Take 1 capsule (120 mg) by mouth 3 times daily (with meals)., Disp: 90 capsule, Rfl: 3  .  quetiapine (SEROQUEL) 50 MG tablet, Take 1 tablet (50 mg) by mouth 4 times daily., Disp: 360 tablet, Rfl: 1  .  sertraline (ZOLOFT) 100 MG tablet, Take 1 tablet (100 mg) by mouth daily., Disp: 90 tablet, Rfl: 3  .  trihexyphenidyl (ARTANE) 2 MG tablet, Take 0.5 tablets (1 mg) by mouth 2 times daily., Disp: 90 tablet, Rfl: 1  .  ziprasidone (GEODON) 80 MG capsule, Take 1 capsule (80 mg) by mouth daily with dinner., Disp: 90 capsule, Rfl: 1  Past Medical History:   Diagnosis Date   . Bipolar affect, depressed (CMS-HCC)    . Chronic back pain    . Hypothyroidism    . Insomnia    . OAB (overactive bladder)    . OCD (obsessive compulsive disorder)    . Osteopenia    . Pure hypercholesterolemia 10/10/2018   . Schizophrenia (CMS-HCC)        No Known Allergies    PHYSICAL EXAMINATION:  Temperature: 97 F (36.1 C) (08/31 1001)  Blood pressure (BP): 124/74 (08/31 1001)  Heart Rate: 73 (08/31 1001)  GENERAL:  Patient is a well appearing and in no acute distress.   RESPIRATIONS:  No respiratory distress, breathing non-labored.  CARDIOVASCULAR:  Regular rate, rhythm.   ABDOMEN:  Soft, nontender, nondistended  PSYCH: Normal mood and affect  NEURO: A&O x 3; normal gait      Diagnostic Data:  Labs:  Last creatinine 0.8    ASSESSMENT:  Julie Monroe is a 73 year old woman with urgency urinary incontinence .      PLAN:  We  discussed management options for her overactive bladder.  I reviewed options such as observation, ongoing fluid modifications, medications, and 3rd line therapy.  For 3rd line therapy we discussed that the options are percutaneous tibial nerve stimulation, sacral neuromodulation, and Botox.  She did several sections of percutaneous nerve stimulation and did not feel that this was helpful in does not wish to try it again.  She was interested in Botox and we discussed the possibility of urinary retention requiring catheterization.  I did recommend that she undergo urodynamic study to evaluate her more Foley if she would want to consider Botox.  We also discussed sacral neuromodulation to see.  In addition to 3rd line therapy I discussed the option of doing dual medical therapy with both Myrbetriq and oxybutynin.  She did elect to proceed with this and so I sent new prescriptions to her pharmacy today.  I will see her back in the office in 3 months.

## 2018-10-14 NOTE — Progress Notes (Signed)
[facility]  [email or web address]  Phone: [phone]   Fax: [phone]  PATIENT: Julie Monroe Adamczak  MRN: [id number]  DOB: Jan 05, 1946  DATE OF SERVICE: 10/14/2018    REFERRING PRACTITIONER: Self, Referred  PRIMARY CARE PROVIDER: Leonia ReaderSasson, Bobby  CHIEF COMPLAINT: rahs    Subjective:   Julie Monroe Dyar is a 73 year old female with history of schizoaffective disorder who was seen in clinic for follow-upof candidal intertrigo with significant improvement with fluconazole but notes since then rash has come back and is red and wondering what she can use to treat it. She has not tried the nystatin she was prescribed. She is trying to keep the area clean and dry. Otherwise feels well and no concerns.     Presents as add on with her husband today.    Past Medical History:   Diagnosis Date   . Bipolar affect, depressed (CMS-HCC)    . Chronic back pain    . Hypothyroidism    . Insomnia    . OAB (overactive bladder)    . OCD (obsessive compulsive disorder)    . Osteopenia    . Pure hypercholesterolemia 10/10/2018   . Schizophrenia (CMS-HCC)      Past Surgical History:   Procedure Laterality Date   . Biilat. Knee replacement     . Rt toe surgery     . TOTAL KNEE ARTHROPLASTY Bilateral      Family History   Problem Relation Name Age of Onset   . Other Mother          Wegener   . Cancer Brother          Leukemia   . Cancer Father          Leukemia   . Heart Disease Father     . Hypertension Father     . No Known Problems Sister     . Cancer Brother          Bladder Cancer     Social History     Socioeconomic History   . Marital status: Married     Spouse name: Not on file   . Number of children: Not on file   . Years of education: Not on file   . Highest education level: Not on file   Occupational History   . Not on file   Social Needs   . Financial resource strain: Not on file   . Food insecurity     Worry: Not on file     Inability: Not on file   . Transportation needs     Medical: Not on file     Non-medical: Not on file      Tobacco Use   . Smoking status: Former Smoker     Packs/day: 2.00     Years: 30.00     Pack years: 60.00   . Smokeless tobacco: Never Used   Substance and Sexual Activity   . Alcohol use: No     Frequency: Never   . Drug use: No   . Sexual activity: Never   Lifestyle   . Physical activity     Days per week: Not on file     Minutes per session: Not on file   . Stress: Not on file   Relationships   . Social Wellsite geologistconnections     Talks on phone: Not on file     Gets together: Not on file     Attends religious service: Not on file  Active member of club or organization: Not on file     Attends meetings of clubs or organizations: Not on file     Relationship status: Not on file   . Intimate partner violence     Fear of current or ex partner: Not on file     Emotionally abused: Not on file     Physically abused: Not on file     Forced sexual activity: Not on file   Other Topics Concern   . Not on file   Social History Narrative   . Not on file     No outpatient medications have been marked as taking for the 10/14/18 encounter (Office Visit) with Crosby Oyster, MD.     No Known Allergies    Review of Systems:  Skin:  Otherwise negative  Objective:     BP 123/77   Pulse 78   Temp 97.3 F (36.3 C)   LMP  (LMP Unknown)     General: Pleasant, in no acute distress, appears stated age  Psychiatric: Mood and affect are within normal limits. Alert and oriented x 3  Skin Exam:  Skin Type: II  Limited body skin exam performed including face, hands, abdomen, and the following was noted:    Exam somewhat limited by body habitus  Erythematous macerated plaques under pannus with satellite papules, improved  No erosions     Assessment/Plan     1. Candidal intertrigo, improved with po fluconazole  - Discussed that this is a chronic condition due to the nature of the location and skin folds  - Discussed she should start nystatin cream bid to affected areas  - Continue to avoid topical steroids  - Discussed keeping area clean  and dry     The above plan of care, diagnosis, orders, and follow-up were discussed with the patient.  Questions related to this recommended plan of care were answered.    Author:  Crosby Oyster, MD 10/14/2018 11:54 AM

## 2018-10-15 ENCOUNTER — Ambulatory Visit: Payer: Medicare Other | Admitting: Psychiatry

## 2018-10-18 ENCOUNTER — Ambulatory Visit: Payer: Medicare Other | Admitting: Psychiatry

## 2018-10-22 ENCOUNTER — Ambulatory Visit: Payer: Medicare Other | Admitting: Internal Medicine

## 2018-10-25 ENCOUNTER — Ambulatory Visit (INDEPENDENT_AMBULATORY_CARE_PROVIDER_SITE_OTHER): Payer: Medicare Other | Admitting: Dermatology

## 2018-10-25 ENCOUNTER — Ambulatory Visit (INDEPENDENT_AMBULATORY_CARE_PROVIDER_SITE_OTHER): Payer: Medicare Other | Admitting: Psychiatry

## 2018-10-25 VITALS — Temp 98.0°F | Wt 281.0 lb

## 2018-10-25 DIAGNOSIS — L810 Postinflammatory hyperpigmentation: Secondary | ICD-10-CM

## 2018-10-25 DIAGNOSIS — G4700 Insomnia, unspecified: Secondary | ICD-10-CM

## 2018-10-25 DIAGNOSIS — F429 Obsessive-compulsive disorder, unspecified: Secondary | ICD-10-CM

## 2018-10-25 DIAGNOSIS — F250 Schizoaffective disorder, bipolar type: Secondary | ICD-10-CM

## 2018-10-25 DIAGNOSIS — F419 Anxiety disorder, unspecified: Secondary | ICD-10-CM

## 2018-10-25 DIAGNOSIS — B372 Candidiasis of skin and nail: Secondary | ICD-10-CM

## 2018-10-25 MED ORDER — SERTRALINE HCL 50 MG OR TABS
ORAL_TABLET | ORAL | 1 refills | Status: DC
Start: 2018-10-25 — End: 2019-03-18

## 2018-10-25 NOTE — Progress Notes (Signed)
Outpatient Psychiatry Progress Note      Chief Complaint:   Regularly scheduled follow-up appointment    Identifying Information:  Julie AnesChristine Monroe is a 73 year old female who presents today for a follow-up appointment.     Interval History:  [place] [facility] VIDEO VISIT Q:   Is patient in state of [place]?: Yes    Do you consent to proceed with the Video Visit today?: Yes      Julie Disheris a83 year oldF with a past psychiatric history of schizoaffective d/o, bipolar type, OCD and unspecified insomnia who presents for follow-up. Pt reports mood, "OK," and denies suicidal ideation, intent and plan. Pt denies associated depressive symptoms incl amotivation, anhedonia, decreased interest, decreased concentration and hopelessness. Pt reports stable anxiety. Pt denies auditory and visual hallucinations. Pt reports chronic, unchanged paranoid ideation of "a stalker", which pt acknowledges as a delusion. Pt reports stably improved insomnia. Pt denies current or recent decreased need for sleep or increased energy > 4 d, hyperverbality, racing thoughts, grandiosity and goal-directed bevavior c/w manic or hypomanic episode at this encounter. Pt reports compliance with psychotropic medications as directed, and denies AEs. Pt confirms today, incl some changes noted in MyChart, that pt is currently administering sertraline 100 mg PO QAM / 50 mg PO QNoon and quetiapine 150 mg PO QHS, without AEs. Pt reports preference for continuing sertraline at current dosage and schedule, and is amenable to trial'ing lower quetiapine with monitoring rebound insomnia (after discussing risks, benefits and alternate treatments, which the patient appeared to understand). Pt reports no change in obsessive thoughts. Discussed Fanapt and Caplyto, which pt reports seeing advertisements about, incl possible benefit of Caplyto for weight gain (though only short-term studies have demonstrated weight neutrality).     Review of Systems:  Denies fever,  chills, chest pain, dyspnea, dizziness, syncope, recent falls, headaches, nausea, and vomiting.      Current Outpatient Medications:   .  ARIPiprazole (ABILIFY) 30 MG tablet, Take 1 tablet (30 mg) by mouth daily., Disp: 90 tablet, Rfl: 1  .  aspirin 81 MG tablet, Take 81 mg by mouth daily., Disp: , Rfl:   .  atorvastatin (LIPITOR) 10 MG tablet, Take 1 tablet (10 mg) by mouth daily., Disp: 90 tablet, Rfl: 3  .  BIOTIN PO, Take 1 capsule by mouth daily., Disp: , Rfl:   .  Calcium Carbonate-Vit D-Min (CALCIUM 1200 PO), Take 1 tablet by mouth daily., Disp: , Rfl:   .  Cholecalciferol (VITAMIN D3) 50 MCG (2000 UT) capsule, Take 1 capsule by mouth daily., Disp: , Rfl:   .  Clobetasol Propionate (IMPOYZ) 0.025 % CREA, Apply topically., Disp: , Rfl:   .  desvenlafaxine (PRISTIQ) 50 MG TB24, Take 1 tablet (50 mg) by mouth daily., Disp: 90 tablet, Rfl: 3  .  econazole (SPECTAZOLE) 1 % cream, Apply 1 Application topically 2 times daily. Use a small amount as directed, Disp: 1 Tube, Rfl: 1  .  fluPHENAZine (PROLIXIN) 5 MG tablet, Take 1 tablet (5 mg) by mouth daily., Disp: 90 tablet, Rfl: 3  .  gabapentin (NEURONTIN) 600 MG tablet, Take 2 tablets (1,200 mg) by mouth at bedtime., Disp: 180 tablet, Rfl: 3  .  levothyroxine (SYNTHROID) 125 MCG tablet, TK 1 T PO QAM, Disp: , Rfl:   .  levothyroxine (SYNTHROID) 137 MCG tablet, TK 1 T PO D, Disp: , Rfl:   .  mirabegron (MYRBETRIQ) 50 MG ER tablet, Take 50 mg by mouth daily., Disp: 30 tablet,  Rfl: 3  .  nystatin (MYCOSTATIN) 100000 UNIT/GM cream, Apply 1 Application topically 2 times daily. Apply to affected area twice a day, Disp: 120 g, Rfl: 2  .  orlistat (XENICAL) 120 MG capsule, Take 1 capsule (120 mg) by mouth 3 times daily (with meals)., Disp: 90 capsule, Rfl: 3  .  oxybutynin (DITROPAN XL) 10 MG Controlled-Release tablet, Take 1 tablet (10 mg) by mouth daily., Disp: 30 tablet, Rfl: 3  .  quetiapine (SEROQUEL) 50 MG tablet, Take 1 tablet (50 mg) by mouth 4 times daily., Disp:  360 tablet, Rfl: 1  .  trihexyphenidyl (ARTANE) 2 MG tablet, Take 0.5 tablets (1 mg) by mouth 2 times daily., Disp: 90 tablet, Rfl: 1  .  ziprasidone (GEODON) 80 MG capsule, Take 1 capsule (80 mg) by mouth daily with dinner., Disp: 90 capsule, Rfl: 1    Vital Signs:  LMP  (LMP Unknown)     Mental Status Exam:    Appearance: Fair hygiene, grooming.  Behavior: Cooperative. Calm.  Gait: No gait abnormality.  Muscle Strength/Tone:No PMR/PMA  Speech: Normal rate, tone and volume  Mood: "OK"  Affect:Mildly flattened  Thought Process: Linear, goal-directed   - Associations: Intact  Thought Content: No suicidal or homicidal ideation, intent and plan. No auditory or visual hallucinations.  Attention Span and Concentration: Mildly distracted in conversation  Language: Able to name objects  Fund of Knowledge: Average  Memory: Both recent and remote memories are intact in context of conversation  Orientation: Intact to person, place and situation  Insight/Judgment:Improved    LABS/ OTHER STUDIES    CHEM 7  Lab Results   Component Value Date/Time    NA 143 07/09/2018 07:41 AM    K 4.2 07/09/2018 07:41 AM    CL 105 07/09/2018 07:41 AM    BUN 26 (H) 07/09/2018 07:41 AM    GLU 99 07/09/2018 07:41 AM    [place] 9.8 07/09/2018 07:41 AM     LFT's  Lab Results   Component Value Date/Time    AST 14 07/09/2018 07:41 AM    ALT 9 07/09/2018 07:41 AM    ALB 4.2 07/09/2018 07:41 AM    TP 7.0 07/09/2018 07:41 AM     CBC  Lab Results   Component Value Date/Time    WBC 4.6 07/09/2018 07:41 AM    HGB 13.5 07/09/2018 07:41 AM    HCT 40.4 07/09/2018 07:41 AM    PLT 182 07/09/2018 07:41 AM     LIPID PANEL  Lab Results   Component Value Date/Time    CHOL 240 (H) 07/09/2018 07:41 AM    HDL 79 07/09/2018 07:41 AM    TRIG 62 07/09/2018 07:41 AM     HGBA1c  No components found for: A1C;3  Miscellaneous   TSH   Date Value Ref Range Status   07/09/2018 0.67 0.40 - 4.50 mIU/L Final        Scales:   [place] PHQ9 DEPRESSION QUESTIONNAIRE 10/10/2018   Interest  0   Depressed 1   Sleep --   Energy --   Appetite --   Failure --   Concentration --   Movement --   Suicide --   Summary(Manual) --   Summary(Calculated) --   Functional --   Some recent data might be hidden     No flowsheet data found.    AIMS (02/07/18):  Facial & Oral Movements: 6  Extremity movements: 3  Trunk Movement: 0  Global Judgment: 1  Total:  10    AIMS (02/20/17):  Facial & Oral Movements: 0  Extremity movements: 2  Trunk Movement: 0  Global Judgment: 1  Total: 3    Assessment:   Julie Disheris a30 year oldF with a past psychiatric history of schizoaffective d/o, bipolar type, OCD and unspecified insomnia who presents for follow-up.Pt demonstrates chronic, stable psychotic delusion, with some evidence of cognitive decline, thought process disorganization and (previously observed) affective blunting.Pt reports stably improved mood, associated depressive symptoms, anxiety and psychotic symptoms (though pt previously endorsed stable psychotic delusion, elicited at this encounter, in addition to some evidence of cognitive decline, thought process disorganization and affective blunting). Shared decision made to downtitrate quetiapine and monitor rebound insomnia.     She is assessed to be at alow acute risk of suicide.   Clinical Global Impression - Severity:4= Moderately ill  :Suggested Guidelines= Overt symptomatology causing noticeable, but modest, functional impairment. .    Diagnosis/Diagnoses:     ICD-10-CM ICD-9-CM   1. Schizoaffective disorder, bipolar type (CMS-HCC)  F25.0 295.70   2. Obsessive-compulsive disorder, unspecified type  F42.9 300.3   3. Anxiety  F41.9 300.00   4. Insomnia, unspecified type  G47.00 780.52     Plan:  Medication Management: Risks, benefits, and alternatives of medications were discussed today with Julie Monroe and she consents to regimen.  #Psychosis:   Decrease to quetiapine 100 mg PO QHS, from 150 mg PO QHS  ? Plan to downtitrate, as tolerated, with monitoring  of rebound insomnia and worsening mood  ? Note, as above, pt reports self-downtitrating quetiapine   Continue fluphenazine 5 mg PO qDaily   Continue aripiprazole 30 mgPOqDaily   Continue ziprasidone 80 mgPOqDaily  #EPS   Continuetrihedyphenidyl(Artane)1 mg POBIDfor EPS  # Anxiety, OCD   Continue gabapentin 1200 mgPO QHS forinsomnia   Continue desvenlafaxine 50 mgqDaily   Increase tosertraline 100 mg QAM / 50 mg PO QNoon  ? Note, as above, pt reports self-uptitrating sertraline (reflected in MyChart)    In-session psychotherapy:  I spent 18 minutes providing psychotherapy for this encounter.  Treatment modality(ies) employed: Psychoeducation and supportive  Progress to date: Improving  -- Psychoeducation wrt newly FDA-approved antipsychotics, and supportive psychotherapy provided  -- Encouraged walking, while reviewing public health guidelines for COVID-19, esp in morning     Encouraged increased day trips, esp as pt reported plans to do day trips have failed recently  -- Supportive psychotherapy provided   -- Discussed COVID-19 related lifestyle changes, and, in this case, positive impact on relationship w/ pt husband  -- Discussed possible actions to take in response to increased fluphenazine pricing (described above)  -- Discussed changing perception of COVID-19 associated quarantine      Discussed plan for excursion, to approximate previously regular extended trips  -- Discussed COVID-19 associated quarantine, and impact on livelihood and daily activities  -- Discussed financial stressor of recently increased medication prices, and offered alternatives to seeking better coverage in addition to [employer] coupon  -- Psychoeducation regarding current regimen, esp sleep aids, and AEs of antipsychotics incl weight gain  -- Psychoeducation regarding current regimen, and possible future plans to reduce polypharmacy to meet pt therapeutic goals to reduce weight gain and sedation  -- F/u SMART  goals  -- Walk of any length at least one per week  -- Go to gym of any length at least once per week  -- Extensive psychoeducation re clozapine and cognitive effects of anticholinergics discussed  Treatment plan: Continue psychoeducation and supportive  Level  of Care: Pt currently does not meet criteria for inpatient psychiatric hospitalization.    Note Author:  Elayne GuerinAndrew Chunkil Kashina Mecum, MD  Resident/Fellow Physician

## 2018-10-25 NOTE — Progress Notes (Signed)
Attending Attestation:  I was present with the resident during critical portions of the history and mental status examination.  I discussed the case with the resident and agree with the findings and plan as documented.  I provided supervision for supportive and other psychotherapy modalities employed to augment medication management. My additions or revisions are included in the record. A brief summary of the encounter follows.    Julie Monroe presents for routine follow up in outpatient clinic. She endorses no significant changes in her psychiatric symptoms.  No suicidal ideation noted. The primary encounter diagnosis was Schizoaffective disorder, bipolar type (CMS-HCC). Diagnoses of Obsessive-compulsive disorder, unspecified type, Anxiety, and Insomnia, unspecified type were also pertinent to this visit. Medications adjustments as per the resident's note.    Francina Ames, MD   Attending Physician

## 2018-10-25 NOTE — Patient Instructions (Signed)
It was nice to see you today!    You have post-inflammatory hyperpigmentation which is a change in skin color once the inflammation has resolved. It is normal and will go away with time.    Under the left breast, still apply the nystatin cream.   Otherwise for other areas, just use as needed!

## 2018-10-25 NOTE — Progress Notes (Signed)
[facility]  [email or web address]  Phone: [phone]   Fax: [phone]  PATIENT: Julie Monroe  MRN: [id number]  DOB: [birth year]  DATE OF SERVICE: 10/25/2018    REFERRING PRACTITIONER: Self, Referred  PRIMARY CARE PROVIDER: Leonia ReaderSasson, Bobby  CHIEF COMPLAINT: rash    Subjective:   Julie Monroe is a 73 year old female with history of schizoaffective disorder who was seen in clinic for follow-upof candidal intertrigo with significant improvement with fluconazole and was last seen on 8/31 but wanted to follow-up today as her husband had an appointment and they travel very far. She thinks overall she's doing much better and wants to know why she has brown spots where she previously had inflammation. Using nystatin cream prn .    Past Medical History:   Diagnosis Date   . Bipolar affect, depressed (CMS-HCC)    . Chronic back pain    . Hypothyroidism    . Insomnia    . OAB (overactive bladder)    . OCD (obsessive compulsive disorder)    . Osteopenia    . Pure hypercholesterolemia 10/10/2018   . Schizophrenia (CMS-HCC)      Past Surgical History:   Procedure Laterality Date   . Biilat. Knee replacement     . Rt toe surgery     . TOTAL KNEE ARTHROPLASTY Bilateral      Family History   Problem Relation Name Age of Onset   . Other Mother          Wegener   . Cancer Brother          Leukemia   . Cancer Father          Leukemia   . Heart Disease Father     . Hypertension Father     . No Known Problems Sister     . Cancer Brother          Bladder Cancer     Social History     Socioeconomic History   . Marital status: Married     Spouse name: Not on file   . Number of children: Not on file   . Years of education: Not on file   . Highest education level: Not on file   Occupational History   . Not on file   Social Needs   . Financial resource strain: Not on file   . Food insecurity     Worry: Not on file     Inability: Not on file   . Transportation needs     Medical: Not on file     Non-medical: Not on file   Tobacco  Use   . Smoking status: Former Smoker     Packs/day: 2.00     Years: 30.00     Pack years: 60.00   . Smokeless tobacco: Never Used   Substance and Sexual Activity   . Alcohol use: No     Frequency: Never   . Drug use: No   . Sexual activity: Never   Lifestyle   . Physical activity     Days per week: Not on file     Minutes per session: Not on file   . Stress: Not on file   Relationships   . Social Wellsite geologistconnections     Talks on phone: Not on file     Gets together: Not on file     Attends religious service: Not on file     Active member of club or organization: Not on file  Attends meetings of clubs or organizations: Not on file     Relationship status: Not on file   . Intimate partner violence     Fear of current or ex partner: Not on file     Emotionally abused: Not on file     Physically abused: Not on file     Forced sexual activity: Not on file   Other Topics Concern   . Not on file   Social History Narrative   . Not on file     No outpatient medications have been marked as taking for the 10/25/18 encounter (Appointment) with Crosby Oyster, MD.     No Known Allergies    Review of Systems:  Skin:  Otherwise negative  Objective:     LMP  (LMP Unknown)     General: Pleasant, in no acute distress, appears stated age  Psychiatric: Mood and affect are within normal limits. Alert and oriented x 3  Skin Exam:  Skin Type: II  Limited body skin exam performed including face, hands, abdomen, and the following was noted:    Exam somewhat limited by body habitus  Erythematous small papules under left breast otherwise no findings on exam today other than hyperpigmented macules at prior sites of inflamamtion    Assessment/Plan     1. Candidal intertrigo, s/p po fluconazole x2 and current nystatin use, much improved today   - Discussed that this is a chronic condition due to the nature of the location and skin folds  - Discussed she should start nystatin cream bid to any  affected areas (left inframammary area)  -  Continue to avoid topical steroids  - Discussed keeping area clean and dry     2. Post-inflammatory hyperpigmentation  - Discussed this is common following an inflammatory dermatosis and will improve with time     The above plan of care, diagnosis, orders, and follow-up were discussed with the patient.  Questions related to this recommended plan of care were answered.    RTC prn    Marcelle Overlie, MD

## 2018-10-30 ENCOUNTER — Ambulatory Visit: Payer: Medicare Other | Admitting: Internal Medicine

## 2018-10-31 ENCOUNTER — Encounter: Payer: Self-pay | Admitting: Psychiatry

## 2018-11-06 ENCOUNTER — Telehealth: Payer: Self-pay | Admitting: Internal Medicine

## 2018-11-06 NOTE — Telephone Encounter (Signed)
Please let Julie Monroe know that her thyroid labs were in the normal range. Please confirm the dose of her thyroid medication that she is taking (should continue that dose), and we can update our recs.    Thank you.

## 2018-11-06 NOTE — Telephone Encounter (Signed)
Mychart MSG sent to patient on 11-06-2018 . ( A.N. )

## 2018-11-11 ENCOUNTER — Other Ambulatory Visit: Payer: Self-pay | Admitting: Psychiatry

## 2018-11-11 MED ORDER — ZIPRASIDONE HCL 80 MG OR CAPS
80.0000 mg | ORAL_CAPSULE | Freq: Every day | ORAL | 1 refills | Status: DC
Start: 2018-11-11 — End: 2018-11-18

## 2018-11-14 ENCOUNTER — Ambulatory Visit: Payer: Medicare Other | Admitting: Dermatology

## 2018-11-15 ENCOUNTER — Ambulatory Visit (INDEPENDENT_AMBULATORY_CARE_PROVIDER_SITE_OTHER): Payer: Medicare Other | Admitting: Dermatology

## 2018-11-15 VITALS — Temp 98.3°F

## 2018-11-15 DIAGNOSIS — L853 Xerosis cutis: Secondary | ICD-10-CM

## 2018-11-15 DIAGNOSIS — I872 Venous insufficiency (chronic) (peripheral): Secondary | ICD-10-CM

## 2018-11-15 DIAGNOSIS — L308 Other specified dermatitis: Secondary | ICD-10-CM

## 2018-11-15 MED ORDER — TRIAMCINOLONE ACETONIDE 0.1 % EX OINT
TOPICAL_OINTMENT | CUTANEOUS | 2 refills | Status: DC
Start: 2018-11-15 — End: 2019-09-16

## 2018-11-15 MED ORDER — AMMONIUM LACTATE 12 % EX LOTN
1.0000 | TOPICAL_LOTION | Freq: Two times a day (BID) | CUTANEOUS | 11 refills | Status: DC
Start: 2018-11-15 — End: 2019-05-26

## 2018-11-15 NOTE — Patient Instructions (Addendum)
Please buy over the counter ammonium lactate (Am-lactin) twice a day to feet as needed for scaly bumps      For the swelling in your legs, please elevate as much as possible and use gentle moisturizers like the ammonium lactate or vaseline   You can wear compression sock too if not too uncomfortable

## 2018-11-15 NOTE — Progress Notes (Signed)
[facility]  [email or web address]  Phone: [phone]   Fax: [phone]  PATIENT: Julie Monroe  MRN: [id number]  DOB: Jul 07, 1945  DATE OF SERVICE: 11/15/2018    REFERRING PRACTITIONER: Leonia ReaderSasson, Bobby  PRIMARY CARE PROVIDER: Leonia ReaderSasson, Bobby  CHIEF COMPLAINT: rash    Subjective:   Julie AnesChristine Monroe is a 73 year old female with history of schizoaffective disorder who was seen in clinic for new concerns today of scaling bumps on the feet that come and go, none today, not itchy but tends to pick as raised. Also has swelling of her ankles, present for many years, no treatment tried. Also thinks she's getting hand eczema from over-washing.     As far as her candidal intertrigo, it's better with nystatin but has recently been worse as she is getting so sweaty with the current heat wave. Here with her husband.     Past Medical History:   Diagnosis Date   . Bipolar affect, depressed (CMS-HCC)    . Chronic back pain    . Hypothyroidism    . Insomnia    . OAB (overactive bladder)    . OCD (obsessive compulsive disorder)    . Osteopenia    . Pure hypercholesterolemia 10/10/2018   . Schizophrenia (CMS-HCC)      Past Surgical History:   Procedure Laterality Date   . Biilat. Knee replacement     . Rt toe surgery     . TOTAL KNEE ARTHROPLASTY Bilateral      Family History   Problem Relation Name Age of Onset   . Other Mother          Wegener   . Cancer Brother          Leukemia   . Cancer Father          Leukemia   . Heart Disease Father     . Hypertension Father     . No Known Problems Sister     . Cancer Brother          Bladder Cancer     Social History     Socioeconomic History   . Marital status: Married     Spouse name: Not on file   . Number of children: Not on file   . Years of education: Not on file   . Highest education level: Not on file   Occupational History   . Not on file   Social Needs   . Financial resource strain: Not on file   . Food insecurity     Worry: Not on file     Inability: Not on file   .  Transportation needs     Medical: Not on file     Non-medical: Not on file   Tobacco Use   . Smoking status: Former Smoker     Packs/day: 2.00     Years: 30.00     Pack years: 60.00   . Smokeless tobacco: Never Used   Substance and Sexual Activity   . Alcohol use: No     Frequency: Never   . Drug use: No   . Sexual activity: Never   Lifestyle   . Physical activity     Days per week: Not on file     Minutes per session: Not on file   . Stress: Not on file   Relationships   . Social Wellsite geologistconnections     Talks on phone: Not on file     Gets together: Not on file  Attends religious service: Not on file     Active member of club or organization: Not on file     Attends meetings of clubs or organizations: Not on file     Relationship status: Not on file   . Intimate partner violence     Fear of current or ex partner: Not on file     Emotionally abused: Not on file     Physically abused: Not on file     Forced sexual activity: Not on file   Other Topics Concern   . Not on file   Social History Narrative   . Not on file     Outpatient Medications Marked as Taking for the 11/15/18 encounter (Office Visit) with Crosby Oyster, MD   Medication Sig Dispense Refill   . ARIPiprazole (ABILIFY) 30 MG tablet Take 1 tablet (30 mg) by mouth daily. 90 tablet 1   . aspirin 81 MG tablet Take 81 mg by mouth daily.     Marland Kitchen atorvastatin (LIPITOR) 10 MG tablet Take 1 tablet (10 mg) by mouth daily. 90 tablet 3   . BIOTIN PO Take 1 capsule by mouth daily.     . Calcium Carbonate-Vit D-Min (CALCIUM 1200 PO) Take 1 tablet by mouth daily.     . Cholecalciferol (VITAMIN D3) 50 MCG (2000 UT) capsule Take 1 capsule by mouth daily.     . Clobetasol Propionate (IMPOYZ) 0.025 % CREA Apply topically.     Marland Kitchen desvenlafaxine (PRISTIQ) 50 MG TB24 Take 1 tablet (50 mg) by mouth daily. 90 tablet 3   . econazole (SPECTAZOLE) 1 % cream Apply 1 Application topically 2 times daily. Use a small amount as directed 1 Tube 1   . fluPHENAZine (PROLIXIN) 5 MG tablet  Take 1 tablet (5 mg) by mouth daily. 90 tablet 3   . gabapentin (NEURONTIN) 600 MG tablet Take 2 tablets (1,200 mg) by mouth at bedtime. 180 tablet 3   . levothyroxine (SYNTHROID) 125 MCG tablet TK 1 T PO QAM     . levothyroxine (SYNTHROID) 137 MCG tablet TK 1 T PO D     . mirabegron (MYRBETRIQ) 50 MG ER tablet Take 50 mg by mouth daily. 30 tablet 3   . nystatin (MYCOSTATIN) 100000 UNIT/GM cream Apply 1 Application topically 2 times daily. Apply to affected area twice a day 120 g 2   . orlistat (XENICAL) 120 MG capsule Take 1 capsule (120 mg) by mouth 3 times daily (with meals). 90 capsule 3   . oxybutynin (DITROPAN XL) 10 MG Controlled-Release tablet Take 1 tablet (10 mg) by mouth daily. 30 tablet 3   . quetiapine (SEROQUEL) 50 MG tablet Take 1 tablet (50 mg) by mouth 4 times daily. 360 tablet 1   . sertraline (ZOLOFT) 50 MG tablet Take 2 tablets (100 mg) by mouth daily AND 1 tablet (50 mg) every evening. 90 tablet 1   . trihexyphenidyl (ARTANE) 2 MG tablet Take 0.5 tablets (1 mg) by mouth 2 times daily. 90 tablet 1   . ziprasidone (GEODON) 80 MG capsule Take 1 capsule (80 mg) by mouth daily with dinner. 90 capsule 1     No Known Allergies    Review of Systems:  Skin:  Otherwise negative  Objective:     Temp 98.3 F (36.8 C) (Temporal)   LMP  (LMP Unknown)     General: Pleasant, in no acute distress, appears stated age  Psychiatric: Mood and affect are within normal limits. Alert and  oriented x 3  Skin Exam:  Skin Type: II  Full body skin exam performed including scalp, face, conjunctiva, lips, neck, chest, back, abdomen, right upper, left upper, right lower, left lower extremity, hands, nails, and the following was noted:    Erythematous papules and thin plaques with scaling in inframammary area b/l and involvement of left abdomen under folds  Xerosis of b/l LE and feet  1+ edema of b/l lower legs and ankles  Left dorsal hand with eczematous plaque, otherwise clear today     Assessment/Plan     1. Candidal  intertrigo, s/p po fluconazole x2 and currently using nystatin prn   - Again reiterated that this is a chronic condition due to the nature of the location and skin folds  - Continue nystatin cream  - Continue to avoid topical steroids  - Discussed keeping area clean and dry     2. Eczematous dermatitis, hands, likely due to increased frequency of washing, mild today  - Discussed gentle skin care  - Start topical TAC ointment bid to affected area prn (rx sent)    3. Xerosis  - Start ammonium lactate lotion     4. Stasis changes, lower extremities  - No active dermatitis at this time  - Discussed leg elevation and compression  - Bland emollients such as vaseline     The above plan of care, diagnosis, orders, and follow-up were discussed with the patient.  Questions related to this recommended plan of care were answered.    RTC 3 months    Mee Hives, MD

## 2018-11-18 ENCOUNTER — Encounter: Payer: Self-pay | Admitting: Psychiatry

## 2018-11-18 ENCOUNTER — Encounter: Payer: Self-pay | Admitting: Urology

## 2018-11-18 DIAGNOSIS — F250 Schizoaffective disorder, bipolar type: Secondary | ICD-10-CM

## 2018-11-18 MED ORDER — ZIPRASIDONE HCL 80 MG OR CAPS
80.0000 mg | ORAL_CAPSULE | Freq: Two times a day (BID) | ORAL | 1 refills | Status: DC
Start: 2018-11-18 — End: 2019-03-18

## 2018-11-18 NOTE — Telephone Encounter (Signed)
Plan:  Medication Management:  #Psychosis:   Continue ziprasidone 80 mgPOBID  ? Documentation updated to reflect pt current and recent dosing of medication

## 2018-11-19 NOTE — Telephone Encounter (Signed)
From: Vangie Bicker  To: Alfonso Patten  Sent: 11/18/2018 6:57 PM PDT  Subject: RE:MSG from Dr. Fredna Dow To answer Dr Eliseo Squires question, I am taking 137 mcg Levothyroxine as prescribed by my endocrinologist who has been following me on this for years. Thank you. Yumi Chandler      ----- Message -----   From:Jaelan Rasheed Edmonia James   Sent:11/06/2018 8:17 AM PDT   TS:[id number] Mccants   Subject:MSG from Dr. Burna Cash,    Per Dr. Leonette Monarch,    Your Thyroid Lab results were in the Normal Range.  She wanted to know what is the current dosage of your Thyroid Medication ( LEVOTHYROXINE ) ?    Please reply back here via Mychart.    Take care,  Elberta Fortis

## 2018-11-19 NOTE — Telephone Encounter (Signed)
From: Julie Monroe  To: Eual Fines, MD  Sent: 11/18/2018 3:11 PM PDT  Subject: 2-Procedural Question    Hello dr, My insurance has stopped covering Myrbetriq until January 1st. (I've. entered the Medicare/[employer] Coverage Gap.) The Myrbetriq-Oxybutynin combination that you prescribed works very well. Do you happen to have any samples or coupons for Myrbetriq so I can continue this regimen through the end of December until my insurance will cover it again. Thank you.   [name]

## 2018-11-19 NOTE — Addendum Note (Signed)
Addended by: Sherlynn Carbon on: 11/19/2018 06:44 AM     Modules accepted: Orders

## 2018-11-26 ENCOUNTER — Encounter: Payer: Self-pay | Admitting: Psychiatry

## 2018-11-26 DIAGNOSIS — F250 Schizoaffective disorder, bipolar type: Secondary | ICD-10-CM

## 2018-11-27 ENCOUNTER — Encounter: Payer: Self-pay | Admitting: Psychiatry

## 2018-11-27 LAB — LIPID(CHOL FRACT) PANEL, BLOOD
Chol/HDLC Ratio: 2.3 (calc) (ref <5.0)
Cholesterol: 182 mg/dL (ref <200)
HDL Cholesterol: 80 mg/dL (ref >=50)
LDL-Cholesterol: 87 mg/dL
Non-HDL Cholesterol: 102 mg/dL (calc) (ref <130)
Triglycerides: 66 mg/dL (ref <150)

## 2018-11-27 LAB — COMPREHENSIVE METABOLIC PANEL, BLOOD
ALT (SGPT): 9 U/L (ref 6–29)
AST (SGOT): 15 U/L (ref 10–35)
Albumin/Glob Ratio: 1.6 (calc) (ref 1.0–2.5)
Albumin: 4.2 g/dL (ref 3.6–5.1)
Alkaline Phos: 101 U/L (ref 37–153)
BUN: 18 mg/dL (ref 7–25)
Bilirubin, Total: 1 mg/dL (ref 0.2–1.2)
Calcium: 9.7 mg/dL (ref 8.6–10.4)
Carbon Dioxide: 30 mmol/L (ref 20–32)
Chloride: 105 mmol/L (ref 98–110)
Creatinine: 0.81 mg/dL (ref 0.60–0.93)
Globulin: 2.7 g/dL (calc) (ref 1.9–3.7)
Glucose: 90 mg/dL (ref 65–99)
Potassium: 4.5 mmol/L (ref 3.5–5.3)
Sodium: 141 mmol/L (ref 135–146)
Total Protein: 6.9 g/dL (ref 6.1–8.1)
eGFR African American: 85 mL/min/{1.73_m2} (ref >=60)
eGFR non-Afr.American: 74 mL/min/{1.73_m2} (ref >=60)

## 2018-11-27 LAB — FREE THYROXINE, BLOOD: Free T4: 1.3 ng/dL (ref 0.8–1.8)

## 2018-11-27 LAB — CPK-CREATINE PHOSPHOKINASE, BLOOD: Creatine Kinase, Total: 76 U/L (ref 29–143)

## 2018-11-27 LAB — GLYCOSYLATED HGB(A1C), BLOOD: Hgb A1C: 5.3 %{Hb} (ref <5.7)

## 2018-11-27 LAB — VITAMIN D, 25-OH TOTAL: Vitamin D, 25-OH, Total: 34 ng/mL (ref 30–100)

## 2018-11-27 LAB — TSH, BLOOD: TSH: 0.86 mIU/L (ref 0.40–4.50)

## 2018-11-27 NOTE — Telephone Encounter (Signed)
From: Vangie Bicker  To: Newt Lukes, MD  Sent: 11/26/2018 3:32 PM PDT  Subject: 2-Procedural Question    Dr Hampton Abbot, I hope you had a pleasant vacation. I think I need to increase my Fluphenazine from 5 to 7 1/2 mg, especially since I have stopped all Seroquel. I also am finding it difficult to shelter-in-place after all these months. If you agree, I would need a prescription for 2 1/2 mg Fluphenazine (to go with my 5 mg) Thank you very much. Julie Monroe

## 2018-11-28 ENCOUNTER — Telehealth: Payer: Self-pay | Admitting: Internal Medicine

## 2018-11-28 NOTE — Telephone Encounter (Signed)
Pt requesting to speak to Magda Paganini, about results and also about coming for an appt because 12/03/18 was canceled, thank you, Please call her back.

## 2018-11-29 ENCOUNTER — Telehealth: Payer: Self-pay | Admitting: Internal Medicine

## 2018-11-29 NOTE — Telephone Encounter (Signed)
Attention Magda Paganini: Husband asks to speak with you regarding hospital requests.  Transferred to clinic RN.  Thank you.

## 2018-11-29 NOTE — Telephone Encounter (Signed)
Aaron Edelman, pt's spouse, calling back. States pt is still in the ED. Explained to him that Tommie Raymond in the ED was extremely reluctant to release any health information unless pt signed a release form, and if she's still there, to have her sign one. He verbalized understanding and states he'll let pt know right now while she's there to sign that release form so Randal can fax [place] her EKG and CXR (labs are in [place]).

## 2018-11-29 NOTE — Telephone Encounter (Signed)
[facility]. Their HIM closes at 1400 on Fridays. Any request for records after 1400 on Friday must go through their [employer]. Their House Supervisor does not come in duty until 1900 on Fridays. Spoke with Tommie Raymond in the ER who covers for the [employer] until 1900 and he'll do his best to find EKG and will call us. Other ED records are in [place], but the EKG is not an attachment.

## 2018-11-29 NOTE — Telephone Encounter (Signed)
Great, I will also access thru Care Everywhere.    Thank you,  Mortimer Fries

## 2018-11-29 NOTE — Telephone Encounter (Signed)
Spoke with patient who is in the car running errands (currently at grocery store) this morning. Pt states over the past few days, she's experienced sharp, stabbing pain mid-chest and slightly to the right. Lasts only seconds and goes away. Denies pain radiating to arm, neck or jaw, or through to back. Has not experienced this today. Denies SOB or diaphoresis with this pain when it occurs. Has felt anxious/stressed lately and thinks it may be from that. States she has never experienced this before and they live in [place]. They are in the car now. Advised that she go to ED or local urgent care for immediate attention/EKG/work up. They verbalized understanding and agreed. Fax number provided so those records can be faxed to PCP's office for Monday's appt.

## 2018-11-29 NOTE — Telephone Encounter (Signed)
Spoke with patient and questions answered. Will keep video appt with Dr. Leonette Monarch on Monday.

## 2018-11-29 NOTE — Telephone Encounter (Signed)
Strongly agree with ED/urgent eval today for chest pain, and not appropriate to wait until next week's telemed appt for further eval.  Would appreciate if we can have records from her ED visit available, however, for her upcoming appointment.    Thank you  Mortimer Fries

## 2018-11-29 NOTE — Telephone Encounter (Signed)
Patient previously notified of below and amenable as documented.

## 2018-11-29 NOTE — Telephone Encounter (Signed)
Husband Aaron Edelman urgently requests call back for 2 office visits, on the same day, next week.  Declined Nurse advice    Reports Patient having "tweaking" pain; (right of center chest), several times a day; for the past 4 or 5 days.      Aware of 911, hospital er, Urgent Care in [place] area.  Okay to leave a voice message.  Thank you.

## 2018-11-29 NOTE — Telephone Encounter (Signed)
Patient's husband following up on ED visit for chest pain. States clinic must contact [facility] ED in [place] to get EKG results before patient's appointment Monday.
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
## 2018-12-02 ENCOUNTER — Ambulatory Visit: Payer: Medicare Other | Attending: Internal Medicine | Admitting: Internal Medicine

## 2018-12-02 DIAGNOSIS — E7800 Pure hypercholesterolemia, unspecified: Secondary | ICD-10-CM | POA: Insufficient documentation

## 2018-12-02 DIAGNOSIS — Z806 Family history of leukemia: Secondary | ICD-10-CM

## 2018-12-02 DIAGNOSIS — R7989 Other specified abnormal findings of blood chemistry: Secondary | ICD-10-CM | POA: Insufficient documentation

## 2018-12-02 DIAGNOSIS — F250 Schizoaffective disorder, bipolar type: Secondary | ICD-10-CM | POA: Insufficient documentation

## 2018-12-02 DIAGNOSIS — R0789 Other chest pain: Secondary | ICD-10-CM | POA: Insufficient documentation

## 2018-12-02 DIAGNOSIS — M545 Low back pain: Secondary | ICD-10-CM | POA: Insufficient documentation

## 2018-12-03 ENCOUNTER — Telehealth: Payer: Self-pay | Admitting: Urology

## 2018-12-03 ENCOUNTER — Ambulatory Visit: Payer: Medicare Other | Admitting: Internal Medicine

## 2018-12-03 ENCOUNTER — Ambulatory Visit: Payer: Medicare Other | Admitting: Psychiatry

## 2018-12-03 NOTE — Telephone Encounter (Signed)
Patient requesting to speak with doctor concerning Myrbetriq , she states medication is expensive for her until January, the help line that was provided to her only services patient's with no insurance.   Patient would like to know if doctor has  samples of Myrbetriq for her until December.

## 2018-12-06 NOTE — Telephone Encounter (Signed)
LVM for patient to call back. ?

## 2018-12-11 ENCOUNTER — Ambulatory Visit: Payer: Medicare Other | Attending: Pain Medicine | Admitting: Pain Medicine

## 2018-12-11 ENCOUNTER — Encounter: Payer: Self-pay | Admitting: Psychiatry

## 2018-12-11 ENCOUNTER — Other Ambulatory Visit: Payer: Self-pay | Admitting: Internal Medicine

## 2018-12-11 ENCOUNTER — Ambulatory Visit (HOSPITAL_BASED_OUTPATIENT_CLINIC_OR_DEPARTMENT_OTHER)
Admit: 2018-12-11 | Discharge: 2018-12-11 | Disposition: A | Discharge status: Home Health | Payer: Medicare Other | Attending: Internal Medicine | Admitting: Internal Medicine

## 2018-12-11 VITALS — BP 143/83 | HR 68 | Temp 98.6°F | Resp 16 | Ht 67.0 in | Wt 280.0 lb

## 2018-12-11 DIAGNOSIS — F250 Schizoaffective disorder, bipolar type: Secondary | ICD-10-CM | POA: Insufficient documentation

## 2018-12-11 DIAGNOSIS — M533 Sacrococcygeal disorders, not elsewhere classified: Secondary | ICD-10-CM | POA: Insufficient documentation

## 2018-12-11 DIAGNOSIS — M8580 Other specified disorders of bone density and structure, unspecified site: Secondary | ICD-10-CM

## 2018-12-11 DIAGNOSIS — M48061 Spinal stenosis, lumbar region without neurogenic claudication: Secondary | ICD-10-CM

## 2018-12-11 DIAGNOSIS — M47816 Spondylosis without myelopathy or radiculopathy, lumbar region: Secondary | ICD-10-CM | POA: Insufficient documentation

## 2018-12-11 DIAGNOSIS — Z1239 Encounter for other screening for malignant neoplasm of breast: Secondary | ICD-10-CM

## 2018-12-11 DIAGNOSIS — M85872 Other specified disorders of bone density and structure, left ankle and foot: Secondary | ICD-10-CM

## 2018-12-11 DIAGNOSIS — E039 Hypothyroidism, unspecified: Secondary | ICD-10-CM | POA: Insufficient documentation

## 2018-12-11 DIAGNOSIS — E7800 Pure hypercholesterolemia, unspecified: Secondary | ICD-10-CM | POA: Insufficient documentation

## 2018-12-11 DIAGNOSIS — M8589 Other specified disorders of bone density and structure, multiple sites: Secondary | ICD-10-CM

## 2018-12-11 DIAGNOSIS — Z87891 Personal history of nicotine dependence: Secondary | ICD-10-CM | POA: Insufficient documentation

## 2018-12-11 DIAGNOSIS — Z6841 Body Mass Index (BMI) 40.0 and over, adult: Secondary | ICD-10-CM | POA: Insufficient documentation

## 2018-12-11 DIAGNOSIS — M7918 Myalgia, other site: Secondary | ICD-10-CM | POA: Insufficient documentation

## 2018-12-11 DIAGNOSIS — Z1231 Encounter for screening mammogram for malignant neoplasm of breast: Secondary | ICD-10-CM

## 2018-12-11 MED ORDER — CELECOXIB 100 MG OR CAPS
100.0000 mg | ORAL_CAPSULE | Freq: Two times a day (BID) | ORAL | 1 refills | Status: DC
Start: 2018-12-11 — End: 2019-09-16

## 2018-12-11 NOTE — Patient Instructions (Addendum)
-   Please walk 15 minutes after each meal (morning/evening constitutional)  - Please take acetaminophen 1000 mg every 8 hours for pain. Please do not exceed 3000 mg of acetaminophen daily.
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
## 2018-12-11 NOTE — Progress Notes (Signed)
[facility]   [address] [place], [place] [address]  t: [phone]  f: [phone]    Lita Mains, M.D.  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     07/02/17: WBC 3.59, HGB 13.5, MCV 87.8, PLT 188   - immature granulocytes 0.01 (0.3%)    12/05/17: WBC 4.18, HGB 13.2, MCV 87.9, PLT 183   - immature granulocytes 0.02 (0.5%)    11/29/18: WBC 6.85, HGB 13.4, MCV 40.1, PLT 209   - immature granulocytes 0.04 (0.5%)    INTERIM HISTORY:  She is here today for her initial consultation and is accompanied by her husband. She denies any fevers, chills, night sweats or pain. She did lose 3-4 lbs since about 2 weeks ago, but she also had started keeping an eye on her diet. She does complain of chronic low back pain, which limits her activity somewhat. She also complains of rash under her breast for about past 2 months, precipitated by heat. She is trying antifungal ointment with some relief.     She states that her fluphenazine was increased about 3 weeks ago due to hearing voices in her head. She wonders if this could be contributing to her abnormal labs.     REVIEW OF SYSTEMS:  A 10x2 point review of systems is negative except as noted in HPI    PMH:   Past Medical History:   Diagnosis Date   . Bipolar  affect, depressed (CMS-HCC)    . Chronic back pain    . Hypothyroidism    . Insomnia    . OAB (overactive bladder)    . OCD (obsessive compulsive disorder)    . Osteopenia    . Pure hypercholesterolemia 10/10/2018   . Schizophrenia (CMS-HCC)      PSurgHx:   Past Surgical History:   Procedure Laterality Date   . Biilat. Knee replacement     . Rt toe surgery     . TOTAL KNEE ARTHROPLASTY Bilateral    Deviated septum    SocHx:   Social History     Tobacco Use   . Smoking status: Former Smoker     Packs/day: 2.00     Years: 30.00     Pack years: 60.00   . Smokeless tobacco: Never Used   Substance Use Topics   . Alcohol use: No     Frequency: Never   . Drug use: No   Quit smoking 1992  Alcohol 1982    FamHx:   family history includes Cancer in her brother, brother, and father; Heart Disease in her father; Hypertension in her father; No Known Problems in her sister; Other in her mother.  Father - acute leukemia, unspecified Dx at 51  2 brothers  - 1 brother chronic  leukemia and bladder cancer 70's, 1 brother acute leukemia 13's  Mother Wagner's (autoimmune)    CURRENT MEDICATIONS:    Current Outpatient Medications:   .  ammonium lactate (AMLACTIN) 12 % lotion, Apply 1 Application topically 2 times daily. Rub in to affected area well for dry skin, Disp: 1 bottle, Rfl: 11  .  ARIPiprazole (ABILIFY) 30 MG tablet, Take 1 tablet (30 mg) by mouth daily., Disp: 90 tablet, Rfl: 1  .  aspirin 81 MG tablet, Take 81 mg by mouth daily., Disp: , Rfl:   .  atorvastatin (LIPITOR) 10 MG tablet, Take 1 tablet (10 mg) by mouth daily., Disp: 90 tablet, Rfl: 3  .  BIOTIN PO, Take 1 capsule by mouth daily., Disp: , Rfl:   .  Calcium Carbonate-Vit D-Min (CALCIUM 1200 PO), Take 1 tablet by mouth daily., Disp: , Rfl:   .  celecoxib (CELEBREX) 100 MG capsule, Take 1 capsule (100 mg) by mouth 2 times daily., Disp: 60 capsule, Rfl: 1  .  Cholecalciferol (VITAMIN D3) 50 MCG (2000 UT) capsule, Take 1 capsule by mouth daily., Disp: , Rfl:   .   Clobetasol Propionate (IMPOYZ) 0.025 % CREA, Apply topically., Disp: , Rfl:   .  desvenlafaxine (PRISTIQ) 50 MG TB24, Take 1 tablet (50 mg) by mouth daily., Disp: 90 tablet, Rfl: 3  .  econazole (SPECTAZOLE) 1 % cream, Apply 1 Application topically 2 times daily. Use a small amount as directed, Disp: 1 Tube, Rfl: 1  .  fluPHENAZine (PROLIXIN) 10 MG tablet, Take 1 tablet (10 mg) by mouth daily., Disp: 30 tablet, Rfl: 2  .  gabapentin (NEURONTIN) 600 MG tablet, Take 2 tablets (1,200 mg) by mouth at bedtime., Disp: 180 tablet, Rfl: 3  .  levothyroxine (SYNTHROID) 137 MCG tablet, TK 1 T PO D, Disp: , Rfl:   .  mirabegron (MYRBETRIQ) 50 MG ER tablet, Take 50 mg by mouth daily., Disp: 30 tablet, Rfl: 3  .  nystatin (MYCOSTATIN) 100000 UNIT/GM powder, Apply 1 Application topically 3 times daily. Apply to affected area, Disp: 15 g, Rfl: 0  .  orlistat (XENICAL) 120 MG capsule, Take 1 capsule (120 mg) by mouth 3 times daily (with meals)., Disp: 90 capsule, Rfl: 3  .  oxybutynin (DITROPAN XL) 10 MG Controlled-Release tablet, Take 1 tablet (10 mg) by mouth daily., Disp: 30 tablet, Rfl: 3  .  quetiapine (SEROQUEL) 50 MG tablet, Take 1 tablet (50 mg) by mouth 4 times daily., Disp: 360 tablet, Rfl: 1  .  sertraline (ZOLOFT) 50 MG tablet, Take 2 tablets (100 mg) by mouth daily AND 1 tablet (50 mg) every evening., Disp: 90 tablet, Rfl: 1  .  triamcinolone (KENALOG) 0.1 % ointment, Use twice a day as needed for hand eczema, Disp: 80 g, Rfl: 2  .  trihexyphenidyl (ARTANE) 2 MG tablet, Take 0.5 tablets (1 mg) by mouth 2 times daily., Disp: 90 tablet, Rfl: 1  .  ziprasidone (GEODON) 80 MG capsule, Take 1 capsule (80 mg) by mouth 2 times daily (with meals)., Disp: 180 capsule, Rfl: 1    ALLERGIES:   No Known Allergies    PHYSICAL EXAMINATION:     VITALS:    BP 128/76 (BP Location: Left arm, BP Patient Position: Sitting, BP cuff size: Large)   Pulse 63   Temp 97.1 F (36.2 C) (Tympanic)   Resp 21   Ht 5\' 6"  (1.676 m)   Wt 126.3 kg  (278 lb 6.4 oz)  LMP  (LMP Unknown)   Breastfeeding No   BMI 44.93 kg/m     Physical Exam   Constitutional: She is oriented to person, place, and time and well-developed, well-nourished, and in no distress. No distress.   HENT:   Head: Normocephalic and atraumatic.   Right Ear: External ear normal.   Left Ear: External ear normal.   Nose: Nose normal.   Mouth/Throat: Oropharynx is clear and moist. No oropharyngeal exudate.   Eyes: Pupils are equal, round, and reactive to light. Conjunctivae and EOM are normal. Right eye exhibits no discharge. Left eye exhibits no discharge. No scleral icterus.   Neck: Normal range of motion. Neck supple. No JVD present. No tracheal deviation present. No thyromegaly present.   Cardiovascular: Normal rate and regular rhythm.   Pulmonary/Chest: Effort normal and breath sounds normal. No respiratory distress. She has no wheezes.   Abdominal: Soft. Bowel sounds are normal. She exhibits no distension and no mass. There is no abdominal tenderness. There is no rebound and no guarding.   Musculoskeletal: Normal range of motion.         General: No tenderness, deformity or edema.   Lymphadenopathy:     She has no cervical adenopathy.   Neurological: She is alert and oriented to person, place, and time. No cranial nerve deficit. Gait normal.   Skin: Skin is warm. Rash noted. She is not diaphoretic. No erythema. No pallor.   Mild, moist rash under breasts   Psychiatric: Mood, memory and judgment normal.      LABORATORIES AND STUDIES:    Results for orders placed or performed in visit on 08/27/18   Strep Culture Group A [place] eSwab    Specimen: Throat   Result Value Ref Range    Description THROAT     Special Info NONE     Culture Result       NEGATIVE FOR GROUP A BETA HEMOLYTIC STREPTOCOCCUS (Streptococcus pyogenes)   Coronavirus Disease 2019 (COVID-19) SCOV Nasal-Pharyngeal    Specimen: Nasal-Pharyngeal; Swab   Result Value Ref Range    COVID-19 Source SWAB     COVID-19 Result NOT  DETECTED     COVID-19 Comment Reference range: Not Detected      IMAGING: Not applicable    PATHOLOGY: Not applicable    Wellbeing Screening     There is no flowsheet data to display.        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     07/02/17: WBC 3.59, HGB 13.5, MCV 87.8, PLT 188   - immature granulocytes 0.01 (0.3%)    12/05/17: WBC 4.18, HGB 13.2, MCV 87.9, PLT 183   - immature granulocytes 0.02 (0.5%)    11/29/18: WBC 6.85, HGB 13.4, MCV 40.1, PLT 209   - immature granulocytes 0.04 (0.5%)    # Abnormal WBC -> immature granulocytes   - Unclear etiology, perhaps due to automated counting  - Will repeat CBC and check peripheral smear  - Fluphenazine has been rarely associated with blood dyscrasias, but this is not specific and more commonly associated with agranulocytosis, would not make any treatment changes yet  - If repeat labs and peripheral smear is normal, then would favor observation    # Fungal rash of torso  -  Nystatin changed to powder to help keep region dry    ORDERS PLACED THIS VISIT:   Orders Placed This Encounter   Procedures   . CBC w/ Diff Lavender   . Hemotology Path Review     FOLLOW UP:   Return in about 1 week (around 12/19/2018).    CC:   Odis Hollingshead, Referred

## 2018-12-12 ENCOUNTER — Ambulatory Visit: Payer: Medicare Other | Admitting: Internal Medicine

## 2018-12-12 ENCOUNTER — Ambulatory Visit: Payer: Medicare Other | Attending: Internal Medicine

## 2018-12-12 ENCOUNTER — Ambulatory Visit (INDEPENDENT_AMBULATORY_CARE_PROVIDER_SITE_OTHER): Payer: Medicare Other | Admitting: Internal Medicine

## 2018-12-12 ENCOUNTER — Ambulatory Visit: Payer: Medicare Other | Admitting: Cardiovascular Disease

## 2018-12-12 ENCOUNTER — Telehealth: Payer: Self-pay

## 2018-12-12 VITALS — BP 128/76 | HR 63 | Temp 97.1°F | Resp 21 | Ht 66.0 in | Wt 278.4 lb

## 2018-12-12 DIAGNOSIS — F250 Schizoaffective disorder, bipolar type: Secondary | ICD-10-CM

## 2018-12-12 DIAGNOSIS — D729 Disorder of white blood cells, unspecified: Secondary | ICD-10-CM | POA: Insufficient documentation

## 2018-12-12 DIAGNOSIS — B369 Superficial mycosis, unspecified: Secondary | ICD-10-CM

## 2018-12-12 LAB — HEMATOLOGY PATH REVIEW: Peripheral Smear Review: NORMAL

## 2018-12-12 LAB — CBC WITH DIFF, BLOOD
ANC automated: 3.9 10*3/uL (ref 2.0–8.1)
Basophils %: 0.5 %
Basophils Absolute: 0 10*3/uL (ref 0.0–0.2)
Eosinophils %: 3.3 %
Eosinophils Absolute: 0.2 10*3/uL (ref 0.0–0.5)
Hematocrit: 40.6 % (ref 34.0–44.0)
Hgb: 13.5 G/DL (ref 11.5–15.0)
Lymphocytes %: 22.8 %
Lymphocytes Absolute: 1.3 10*3/uL (ref 0.9–3.3)
MCH: 29.1 PG (ref 27.0–33.5)
MCHC: 33.2 G/DL (ref 32.0–35.5)
MCV: 87.6 FL (ref 81.5–97.0)
MPV: 10.7 FL (ref 7.2–11.7)
Monocytes %: 6.5 %
Monocytes Absolute: 0.4 10*3/uL (ref 0.0–0.8)
Neutrophils % (A): 66.9 %
PLT Count: 178 10*3/uL (ref 150–400)
RBC: 4.64 10*6/uL (ref 3.70–5.00)
RDW-CV: 13.9 % (ref 11.6–14.4)
White Bld Cell Count: 5.8 10*3/uL (ref 4.0–10.5)

## 2018-12-12 MED ORDER — NYSTATIN 100000 UNIT/GM EX POWD
1.0000 | Freq: Three times a day (TID) | CUTANEOUS | 0 refills | Status: DC
Start: 2018-12-12 — End: 2019-05-26

## 2018-12-12 NOTE — Telephone Encounter (Signed)
-----   Message from Charlotte Crumb, MD sent at 12/11/2018  3:10 PM PDT -----  Please let Ms. Schachter know that her bone density test was normal.  We can plan to repeat it again in 2 years to monitor.  Thank you.

## 2018-12-12 NOTE — Telephone Encounter (Signed)
12/12/18 9:57am called patient left message stating we have a message from dr Leonette Monarch regarding test results. Please check your my chart portal for Dr Leonette Monarch message test results. raranda ma

## 2018-12-12 NOTE — Telephone Encounter (Signed)
-----   Message from Charlotte Crumb, MD sent at 12/11/2018  3:11 PM PDT -----  Please let pt know that her mammogram showed no concerning findings, and we can plan to repeat that again in 1 year.  Thank you.

## 2018-12-13 ENCOUNTER — Encounter: Payer: Self-pay | Admitting: Psychiatry

## 2018-12-13 DIAGNOSIS — F250 Schizoaffective disorder, bipolar type: Secondary | ICD-10-CM

## 2018-12-13 MED ORDER — FLUPHENAZINE HCL 10 MG OR TABS
10.0000 mg | ORAL_TABLET | Freq: Every day | ORAL | 2 refills | Status: AC
Start: 2018-12-13 — End: 2019-03-13

## 2018-12-13 NOTE — Telephone Encounter (Signed)
From: Vangie Bicker  To: Newt Lukes, MD  Sent: 12/13/2018 4:46 AM PDT  Subject: 2-Procedural Question    Hi Dr ]ark, I just saw Dr Vear Clock on [ct 29tth. He works in [employer] hematology/oncology. My high level of immature granulocytes may well be due to the Fluphenazine. He wants to substitute another medication but is deferring to your judgement. I have only enough to last until Wednesday November 4th. Thank you.   Julie Monroe      ----- Message -----   From:Yumiko Alkins Fleet Contras, MD   Sent:11/28/2018 12:44 PM PDT   EP:[id number] [place]   Subject:RE: 2-Procedural Question    Hello Julie Monroe,    Yes, please incresae to fluphenazine 10 mg oral daily (ie, two (2) x 5 mg tabs). Can you inform me how many days supply you have remaining, and I'll write a new prescription accordingly. It may also be worthwhile to call the pharmacy (after I send in the prescription) to get an estimate on out of pocket expenses. The most it should be is ~$80 per month, as listed on GoodRx (https://www.goodrx.com/fluphenazine).    Also, I'm glad to hear the trips are going strong!      ----- Message -----   From:Julie Monroe   Sent:11/27/2018 2:40 PM PDT   YS:[id number] Chunkil Kees Idrovo, MD   Subject:2-Procedural Question    Thank you for. your prompt response. Dr Hampton Abbot. I would like to double up on the 5 mg. Fluphenazine to try it out. I am in the Medicare Coverage Gap now so this med is very expensive again. If it turns out to be too much, I'll let you know. Thanks again. We're starting day trips once a week and then in November we have the first overnight already scheduled!      ----- Message -----   From:Emmah Bratcher Fleet Contras, MD   Sent:11/27/2018 10:46 AM PDT   WF:[id number] Julie Monroe   Subject:RE: 2-Procedural Question    Hello Julie Monroe,    First, I would agree with increasing fluphenazine. There are two options based on the available tablet sizes: Either increase to fluphenazine 7 mg (5 mg tab + 2 mg tab) and 10 mg (10 mg tab). I would  recommend increasing to fluphenazine 7 mg trial, and consider increasing if there is no response. Please tell me how you would like to proceed.    Second, I agree with the difficulty sheltering in place. At the risk of sounding like a broken record, I would recommend slowly testing restoring activities that you feel are safe. Trips, going to stores, drive in theaters, etc. (while masking and distancing) to build back towards normal activities while being safe.          ----- Message -----   From:Karessa Rybka   Sent:11/26/2018 3:32 PM PDT   UK:GURKYH Fleet Contras, MD   Subject:2-Procedural Question    Dr Hampton Abbot, I hope you had a pleasant vacation. I think I need to increase my Fluphenazine from 5 to 7 1/2 mg, especially since I have stopped all Seroquel. I also am finding it difficult to shelter-in-place after all these months. If you agree, I would need a prescription for 2 1/2 mg Fluphenazine (to go with my 5 mg) Thank you very much. Julie Monroe

## 2018-12-15 NOTE — Telephone Encounter (Signed)
Provider contacted Dr. Jolyn Lent, Heme/Onc. Repeat CBC demonstrated normalizing granulocyte counts. No recommendation made by Heme/Onc to trial switch away from fluphenazine. Provider shared discussion to continue fluphenazine at this time, pending further discussion with pt at next encounter.

## 2018-12-16 ENCOUNTER — Telehealth (INDEPENDENT_AMBULATORY_CARE_PROVIDER_SITE_OTHER): Payer: Medicare Other | Admitting: Internal Medicine

## 2018-12-16 ENCOUNTER — Ambulatory Visit (INDEPENDENT_AMBULATORY_CARE_PROVIDER_SITE_OTHER): Payer: Medicare Other | Admitting: Cardiovascular Disease

## 2018-12-16 ENCOUNTER — Ambulatory Visit: Payer: Medicare Other | Admitting: Internal Medicine

## 2018-12-16 VITALS — BP 138/78 | HR 76 | Temp 97.7°F | Ht 66.0 in | Wt 276.5 lb

## 2018-12-16 DIAGNOSIS — D729 Disorder of white blood cells, unspecified: Secondary | ICD-10-CM

## 2018-12-16 DIAGNOSIS — E782 Mixed hyperlipidemia: Secondary | ICD-10-CM

## 2018-12-16 DIAGNOSIS — F250 Schizoaffective disorder, bipolar type: Secondary | ICD-10-CM

## 2018-12-16 DIAGNOSIS — I350 Nonrheumatic aortic (valve) stenosis: Secondary | ICD-10-CM

## 2018-12-16 DIAGNOSIS — R079 Chest pain, unspecified: Secondary | ICD-10-CM | POA: Insufficient documentation

## 2018-12-16 DIAGNOSIS — I4510 Unspecified right bundle-branch block: Secondary | ICD-10-CM

## 2018-12-16 NOTE — Telephone Encounter (Signed)
12/16/18 3:43pm called patient relayed message results to patient. Verbalized understanding.  raranda ma

## 2018-12-16 NOTE — Progress Notes (Signed)
CARDIOLOGY NEW PATIENT VIST    Referring: Charlotte Crumb    HPI:  Julie Monroe is a 73 year old female here for a cardiovascular evaluation.  Patient has history of heart murmur and RBBB.  She had echocardiogram 03/2018 which showed mild aortic valve stenosis.    She had chest pain 10/16 and was evaluated in [facility].  Workup was negative.  No stress test.  No recurrence of chest pain.  Symptoms were atypical.  Patient mentioned that her father had MI at age 58 and her older sister has atrial fibrillation.    Patient has limited activity due to sheltering due to covid    Past Medical History:  Past Medical History:   Diagnosis Date   . Bipolar affect, depressed (CMS-HCC)    . Chronic back pain    . Hypothyroidism    . Insomnia    . OAB (overactive bladder)    . OCD (obsessive compulsive disorder)    . Osteopenia    . Pure hypercholesterolemia 10/10/2018   . Schizophrenia (CMS-HCC)         Medications:    Current Outpatient Medications:   .  ammonium lactate (AMLACTIN) 12 % lotion, Apply 1 Application topically 2 times daily. Rub in to affected area well for dry skin, Disp: 1 bottle, Rfl: 11  .  ARIPiprazole (ABILIFY) 30 MG tablet, Take 1 tablet (30 mg) by mouth daily., Disp: 90 tablet, Rfl: 1  .  aspirin 81 MG tablet, Take 81 mg by mouth daily., Disp: , Rfl:   .  atorvastatin (LIPITOR) 10 MG tablet, Take 1 tablet (10 mg) by mouth daily., Disp: 90 tablet, Rfl: 3  .  BIOTIN PO, Take 1 capsule by mouth daily., Disp: , Rfl:   .  Calcium Carbonate-Vit D-Min (CALCIUM 1200 PO), Take 1 tablet by mouth daily., Disp: , Rfl:   .  celecoxib (CELEBREX) 100 MG capsule, Take 1 capsule (100 mg) by mouth 2 times daily., Disp: 60 capsule, Rfl: 1  .  Cholecalciferol (VITAMIN D3) 50 MCG (2000 UT) capsule, Take 1 capsule by mouth daily., Disp: , Rfl:   .  Clobetasol Propionate (IMPOYZ) 0.025 % CREA, Apply topically., Disp: , Rfl:   .  desvenlafaxine (PRISTIQ) 50 MG TB24, Take 1 tablet (50 mg) by mouth daily., Disp: 90 tablet, Rfl:  3  .  econazole (SPECTAZOLE) 1 % cream, Apply 1 Application topically 2 times daily. Use a small amount as directed, Disp: 1 Tube, Rfl: 1  .  fluPHENAZine (PROLIXIN) 10 MG tablet, Take 1 tablet (10 mg) by mouth daily., Disp: 30 tablet, Rfl: 2  .  gabapentin (NEURONTIN) 600 MG tablet, Take 2 tablets (1,200 mg) by mouth at bedtime., Disp: 180 tablet, Rfl: 3  .  levothyroxine (SYNTHROID) 137 MCG tablet, TK 1 T PO D, Disp: , Rfl:   .  mirabegron (MYRBETRIQ) 50 MG ER tablet, Take 50 mg by mouth daily., Disp: 30 tablet, Rfl: 3  .  nystatin (MYCOSTATIN) 100000 UNIT/GM powder, Apply 1 Application topically 3 times daily. Apply to affected area, Disp: 15 g, Rfl: 0  .  orlistat (XENICAL) 120 MG capsule, Take 1 capsule (120 mg) by mouth 3 times daily (with meals)., Disp: 90 capsule, Rfl: 3  .  oxybutynin (DITROPAN XL) 10 MG Controlled-Release tablet, Take 1 tablet (10 mg) by mouth daily., Disp: 30 tablet, Rfl: 3  .  quetiapine (SEROQUEL) 50 MG tablet, Take 1 tablet (50 mg) by mouth 4 times daily., Disp: 360 tablet,  Rfl: 1  .  sertraline (ZOLOFT) 50 MG tablet, Take 2 tablets (100 mg) by mouth daily AND 1 tablet (50 mg) every evening., Disp: 90 tablet, Rfl: 1  .  triamcinolone (KENALOG) 0.1 % ointment, Use twice a day as needed for hand eczema, Disp: 80 g, Rfl: 2  .  trihexyphenidyl (ARTANE) 2 MG tablet, Take 0.5 tablets (1 mg) by mouth 2 times daily., Disp: 90 tablet, Rfl: 1  .  ziprasidone (GEODON) 80 MG capsule, Take 1 capsule (80 mg) by mouth 2 times daily (with meals)., Disp: 180 capsule, Rfl: 1    Allergies:  No Known Allergies    Past Surgical History:  Past Surgical History:   Procedure Laterality Date   . Biilat. Knee replacement     . Rt toe surgery     . TOTAL KNEE ARTHROPLASTY Bilateral        Family History:  Family History   Problem Relation Name Age of Onset   . Other Mother          Wegener   . Cancer Brother          Leukemia   . Cancer Father          Leukemia   . Heart Disease Father     . Hypertension Father      . No Known Problems Sister     . Cancer Brother          Bladder Cancer       Social History:  Social History     Socioeconomic History   . Marital status: Married     Spouse name: Not on file   . Number of children: Not on file   . Years of education: Not on file   . Highest education level: Not on file   Occupational History   . Not on file   Social Needs   . Financial resource strain: Not on file   . Food insecurity     Worry: Not on file     Inability: Not on file   . Transportation needs     Medical: Not on file     Non-medical: Not on file   Tobacco Use   . Smoking status: Former Smoker     Packs/day: 2.00     Years: 30.00     Pack years: 60.00   . Smokeless tobacco: Never Used   Substance and Sexual Activity   . Alcohol use: No     Frequency: Never   . Drug use: No   . Sexual activity: Never   Lifestyle   . Physical activity     Days per week: Not on file     Minutes per session: Not on file   . Stress: Not on file   Relationships   . Social Product manager on phone: Not on file     Gets together: Not on file     Attends religious service: Not on file     Active member of club or organization: Not on file     Attends meetings of clubs or organizations: Not on file     Relationship status: Not on file   . Intimate partner violence     Fear of current or ex partner: Not on file     Emotionally abused: Not on file     Physically abused: Not on file     Forced sexual activity: Not on file   Other  Topics Concern   . Not on file   Social History Narrative   . Not on file       Review of Systems:  (+) See HPI  (-) HA, fever, rigors, chills, palpitations, dizziness, syncope, CP, jaw/arm pain/numbness/paraesthesias, SOB/DOE, cough, hemoptysis, extremity swelling, PND, abdominal pain, N&V, BRBPR, melena, dysuria, hematuria, cold/heat intolerance, tremor, balance difficulties     Physical Exam:   BP 138/78 (BP Location: Left arm, BP Patient Position: Sitting)   Pulse 76   Temp 97.7 F (36.5 C) (Temporal)    Ht '5\' 6"'  (1.676 m)   Wt 125.4 kg (276 lb 8 oz)   LMP  (LMP Unknown)   BMI 44.63 kg/m     General Appearance: Alert, oriented, cooperative, no acute distress.  Obese  HEENT:  Normocephalic, atraumatic, PERRL, conjunctiva/corneas clear  Neck: Supple, non-tender, normal thyroid, no  carotid bruits or JVD  Lungs: Clear to auscultation bilaterally, no rales or rhonchi  Chest Wall: No tenderness or deformity  Heart: Regular rhythm, normal S1 and S2  2/6 early peaking systolic murmur at the base, no gallop or rub  Abdomen: Soft, non-tende, bowel sounds active, no masses, or organomegaly  Extremities: No cyanosis, clubbing or edema  Pulses: 2+ and symmetric all extremities  Skin: Skin color, texture, turgor normal, no rashes or lesions  Neurologic: CNII-XII intact, normal strength, otherwise grossly intact.    Cardiology Studies:    Echo 04/08/2018   Summary:   1. Normal left ventricular systolic function. The left ventricular ejection fraction is 58% by Biplane.   2. Mild aortic valve stenosis.   3. Mildly enlarged right ventricle.   4. Left atrium mildly dilated.     ECG INTERPRETATION     Preliminary   SINUS RHYTHM RIGHT BUNDLE BRANCH BLOCK [120+ ms QRS DURATION, UPRIGHT V1, 40+ ms S IN I/aVL/V4/V5/V6] LEFT ANTERIOR FASCICULAR BLOCK [QRS AXIS <= -45, QR IN I, RS IN II] VOLTAGE CRITERIA FOR LVH [MEETS CRITERIA IN ONE OF: R(aVL), S(V1), R(V5), R(V5/V6)+S(V1)] ABNORMAL ECG UNCONFIRMED REPORT         Lab Review:   Lab Results   Component Value Date    K 4.5 11/26/2018    CL 105 11/26/2018    BUN 18 11/26/2018    CREAT 0.81 11/26/2018    GLU 90 11/26/2018    [place] 9.7 11/26/2018     Lab Results   Component Value Date    ALB 4.2 11/26/2018    ALK 101 11/26/2018    AST 15 11/26/2018    ALT 9 11/26/2018    TBILI 1.0 11/26/2018     Lab Results   Component Value Date    CHOL 182 11/26/2018    TRIG 66 11/26/2018    HDL 80 11/26/2018    LDL 87 11/26/2018     Lab Results   Component Value Date    WBCCOUNT 5.8 12/12/2018     HGB 13.5 12/12/2018    HCT 40.6 12/12/2018    MCV 87.6 12/12/2018    MCHC 33.2 12/12/2018     Lab Results   Component Value Date    TSH 0.86 11/26/2018    FREET4 1.3 11/26/2018    A1C 5.3 11/26/2018         Assessment and Plan:  Problem List Items Addressed This Visit        Cardiovascular    Chest pain, unspecified type    Relevant Orders    ECG, In Clinic (Completed)    RBBB  Mild aortic valve stenosis - Primary    Relevant Orders    Complete 2D ECHO with Image Enhancement Agent if Necessary    Mixed hyperlipidemia        Patient has mild aortic valve stenosis.  I will repeat echocardiogram a year after her initial study to evaluate the progression of her valvular disease.  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  Monitor    Chest pain, resolved.  No further workup necessary at this time    HLD on statis.  I had 45 min discussion about risk and benefits of statin therapy in view of her risk profile and aortic valve stenosis.  Questions were answered.  Patient voiced understanding    IAlvino Chapel MD

## 2018-12-19 ENCOUNTER — Encounter: Payer: Self-pay | Admitting: Internal Medicine

## 2018-12-19 NOTE — Progress Notes (Signed)
[facility]   [address] [place], [place] [address]  t: [phone]  f: [phone]    Lita Mains, M.D.  Assistant Clinical Professor  Division of Hematology/Oncology, Dept of Medicine  School of [employer]  [employer] [place], [facility] [place]  [address] [place], [place] [address]  t: [phone]    [facility] [place]  [address], [place], [place] [address]  t: [phone]    Telemedicine Visit Note    Date of Visit: 12/16/2018     CHIEF COMPLAINT: Follow-Up: Phone    HISTORY OF PRESENT ILLNESS:  Julie Monroe is a 73 year old female with bipolar schizoaffective disorder on multiple psychiatric medications referred here for evaluation of immature granulocytes noted on her CBC from an outside hospital [facility]).     07/02/17: WBC 3.59, HGB 13.5, MCV 87.8, PLT 188   - immature granulocytes 0.01 (0.3%)    12/05/17: WBC 4.18, HGB 13.2, MCV 87.9, PLT 183   - immature granulocytes 0.02 (0.5%)    11/29/18: WBC 6.85, HGB 13.4, MCV 40.1, PLT 209   - immature granulocytes 0.04 (0.5%)    12/12/18: WBC 5.8 (normal diff), HGB 13.5, MCV 87.6, PLT 178   - peripheral smear with WBC morphology normal, RBC/PLT essentially normal    INTERIM HISTORY:  She is here today for follow up. She is doing well overall and does not have any health changes since last week. She has no complaints.     REVIEW OF SYSTEMS:  A 10x2 point review of systems is negative except as noted in HPI    PMH:   Past Medical History:   Diagnosis Date   . Bipolar affect, depressed (CMS-HCC)    . Chronic back pain    . Hypothyroidism    . Insomnia    . OAB (overactive bladder)    . OCD (obsessive compulsive disorder)    . Osteopenia    . Pure hypercholesterolemia 10/10/2018   . Schizophrenia (CMS-HCC)      PSurgHx:   Past Surgical History:   Procedure Laterality Date   . Biilat. Knee replacement     . Rt toe surgery     .  TOTAL KNEE ARTHROPLASTY Bilateral    Deviated septum    SocHx:   Social History     Tobacco Use   . Smoking status: Former Smoker     Packs/day: 2.00     Years: 30.00     Pack years: 60.00   . Smokeless tobacco: Never Used   Substance Use Topics   . Alcohol use: No     Frequency: Never   . Drug use: No   Quit smoking 1992  Alcohol 1982    FamHx:   family history includes Cancer in her brother, brother, and father; Heart Disease in her father; Hypertension in her father; No Known Problems in her sister; Other in her mother.  Father - acute leukemia, unspecified Dx at 68  2 brothers  - 1 brother chronic leukemia and bladder cancer 76's, 1 brother acute leukemia 32's  Mother Wagner's (autoimmune)    CURRENT MEDICATIONS:    Current Outpatient Medications:   .  ammonium lactate (AMLACTIN) 12 % lotion, Apply 1 Application topically 2 times daily. Rub in to affected area well for dry skin, Disp: 1 bottle, Rfl: 11  .  ARIPiprazole (ABILIFY) 30 MG tablet,  Take 1 tablet (30 mg) by mouth daily., Disp: 90 tablet, Rfl: 1  .  aspirin 81 MG tablet, Take 81 mg by mouth daily., Disp: , Rfl:   .  atorvastatin (LIPITOR) 10 MG tablet, Take 1 tablet (10 mg) by mouth daily., Disp: 90 tablet, Rfl: 3  .  BIOTIN PO, Take 1 capsule by mouth daily., Disp: , Rfl:   .  Calcium Carbonate-Vit D-Min (CALCIUM 1200 PO), Take 1 tablet by mouth daily., Disp: , Rfl:   .  celecoxib (CELEBREX) 100 MG capsule, Take 1 capsule (100 mg) by mouth 2 times daily., Disp: 60 capsule, Rfl: 1  .  Cholecalciferol (VITAMIN D3) 50 MCG (2000 UT) capsule, Take 1 capsule by mouth daily., Disp: , Rfl:   .  Clobetasol Propionate (IMPOYZ) 0.025 % CREA, Apply topically., Disp: , Rfl:   .  desvenlafaxine (PRISTIQ) 50 MG TB24, Take 1 tablet (50 mg) by mouth daily., Disp: 90 tablet, Rfl: 3  .  econazole (SPECTAZOLE) 1 % cream, Apply 1 Application topically 2 times daily. Use a small amount as directed, Disp: 1 Tube, Rfl: 1  .  fluPHENAZine (PROLIXIN) 10 MG tablet, Take 1 tablet  (10 mg) by mouth daily., Disp: 30 tablet, Rfl: 2  .  gabapentin (NEURONTIN) 600 MG tablet, Take 2 tablets (1,200 mg) by mouth at bedtime., Disp: 180 tablet, Rfl: 3  .  levothyroxine (SYNTHROID) 137 MCG tablet, TK 1 T PO D, Disp: , Rfl:   .  mirabegron (MYRBETRIQ) 50 MG ER tablet, Take 50 mg by mouth daily., Disp: 30 tablet, Rfl: 3  .  nystatin (MYCOSTATIN) 100000 UNIT/GM powder, Apply 1 Application topically 3 times daily. Apply to affected area, Disp: 15 g, Rfl: 0  .  orlistat (XENICAL) 120 MG capsule, Take 1 capsule (120 mg) by mouth 3 times daily (with meals)., Disp: 90 capsule, Rfl: 3  .  oxybutynin (DITROPAN XL) 10 MG Controlled-Release tablet, Take 1 tablet (10 mg) by mouth daily., Disp: 30 tablet, Rfl: 3  .  quetiapine (SEROQUEL) 50 MG tablet, Take 1 tablet (50 mg) by mouth 4 times daily., Disp: 360 tablet, Rfl: 1  .  sertraline (ZOLOFT) 50 MG tablet, Take 2 tablets (100 mg) by mouth daily AND 1 tablet (50 mg) every evening., Disp: 90 tablet, Rfl: 1  .  triamcinolone (KENALOG) 0.1 % ointment, Use twice a day as needed for hand eczema, Disp: 80 g, Rfl: 2  .  trihexyphenidyl (ARTANE) 2 MG tablet, Take 0.5 tablets (1 mg) by mouth 2 times daily., Disp: 90 tablet, Rfl: 1  .  ziprasidone (GEODON) 80 MG capsule, Take 1 capsule (80 mg) by mouth 2 times daily (with meals)., Disp: 180 capsule, Rfl: 1    ALLERGIES:   No Known Allergies    PHYSICAL EXAMINATION:     VITALS:    LMP  (LMP Unknown)     Physical Exam     LABORATORIES AND STUDIES:    Results for orders placed or performed in visit on 12/16/18   ECG, In Clinic   Result Value Ref Range    ECG INTERPRETATION       SINUS RHYTHM RIGHT BUNDLE BRANCH BLOCK  [120+ ms QRS DURATION, UPRIGHT V1, 40+ ms S IN I/aVL/V4/V5/V6] LEFT ANTERIOR FASCICULAR BLOCK  [QRS AXIS <= -45, QR IN I, RS IN II] VOLTAGE CRITERIA FOR LVH  [MEETS CRITERIA IN ONE OF: R(aVL), S(V1), R(V5), R(V5/V6)+S(V1)] ABNORMAL ECG  UNCONFIRMED REPORT    VENTRICULAR RATE 63 BPM  PR INTERVAL 153 ms    QRS  INTERVAL/DURATION 165 ms    QT 430 ms    QTC INTERVAL 438 ms    P AXIS 37 Deg    R AXIS -55 Deg    T AXIS 11 Deg    R-R INTERVAL AVERAGE 940 ms     IMAGING: Not applicable    PATHOLOGY: Not applicable    Wellbeing Screening     There is no flowsheet data to display.        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     # Abnormal WBC -> immature granulocytes   - Either resolved or noted immature granulocytes from outside hospital may have been error from automated counting  - CBC and peripheral smear normal  - Recommend continued observation and would not make any changes to psychiatric medications at this time  - Please refer back with any clinical or laboratory changes    ORDERS PLACED THIS VISIT:   No orders of the defined types were placed in this encounter.    FOLLOW UP:   Return if symptoms worsen or fail to improve.    CC:   Odis Hollingshead, Referred     Due to COVID-19 pandemic and a federally declared state of public health emergency, this telemedicine visit was conducted Audio Only. Total time spent was 5-10 minutes.

## 2018-12-20 LAB — ECG IN CLINIC
P AXIS: 165 Deg
PR INTERVAL: 153 ms
QRS INTERVAL/DURATION: 165 ms
QT: 430 ms
QTc (Bazett): 438 ms
R AXIS: -55 Deg
R-R INTERVAL AVERAGE: 940 ms
T AXIS: 11 Deg
VENTRICULAR RATE: 63 {beats}/min

## 2018-12-23 ENCOUNTER — Encounter: Payer: Self-pay | Admitting: Psychiatry

## 2018-12-24 ENCOUNTER — Ambulatory Visit: Payer: Medicare Other | Admitting: Pain Medicine

## 2018-12-24 ENCOUNTER — Ambulatory Visit (INDEPENDENT_AMBULATORY_CARE_PROVIDER_SITE_OTHER): Payer: Medicare Other | Admitting: Psychiatry

## 2018-12-24 DIAGNOSIS — G4700 Insomnia, unspecified: Secondary | ICD-10-CM

## 2018-12-24 DIAGNOSIS — F419 Anxiety disorder, unspecified: Secondary | ICD-10-CM

## 2018-12-24 DIAGNOSIS — F429 Obsessive-compulsive disorder, unspecified: Secondary | ICD-10-CM

## 2018-12-24 DIAGNOSIS — G259 Extrapyramidal and movement disorder, unspecified: Secondary | ICD-10-CM

## 2018-12-24 DIAGNOSIS — R251 Tremor, unspecified: Secondary | ICD-10-CM

## 2018-12-24 DIAGNOSIS — F250 Schizoaffective disorder, bipolar type: Secondary | ICD-10-CM

## 2018-12-24 NOTE — Progress Notes (Signed)
Outpatient Psychiatry Progress Note      Chief Complaint:   Regularly scheduled follow-up appointment    Identifying Information:  Julie Monroe is a 73 year old female who presents today for a follow-up appointment.     Interval History:  Julie Monroe MYC VIDEO VISIT Q:   Is patient in state of [place]?: Yes    Do you consent to proceed with the Video Visit today?: Yes      Julie Monroe a72 year oldF with a past psychiatric history of schizoaffective d/o, bipolar type, OCD and unspecified insomnia who presents for follow-up. Pt reports mood, "OK," and denies suicidal ideation, intent and plan. Pt denies associated depressive symptoms incl amotivation, anhedonia, decreased interest, decreased concentration and hopelessness. Pt reports intervally worsened anxiety. Pt denies auditory and visual hallucinations. Pt reports chronic, possibly worsened paranoid ideation re "a stalker"and thought transmission. Pt also observed to be distracted and possibly internally preoccupied. Pt reports having self-discontinued quetiapine. Pt reports having increased to fluphenazine 10 mg PO qDaily, but self-titrated to 5 mg PO qDaily ~3 da due to "grogginess", in addition to self-titrating to trihexyphenidyl 3 mg PO qDaily. Pt reports (and is observed) to have mixed bilateral upper extremity tremor, with unclear temporal relationship with medication changes. Pt denies worsening of psychotic s/s with quetiapine discontinuation and denies improvement on increased fluphenazine. Discussed and reviewed plan to continue fluphenazine 5 mg PO qDaily and trihexyphenidyl 3 mg PO qDaily, and report to provider in 1-2 w (and sooner if worsens). Discussed possibility of clozapine or ECT, incl risks, benefits and alternate treatments, if psychotic s/s persist. Pt denies PMH of seizures. Pt denies current or recent decreased need for sleep or increased energy > 4 d, hyperverbality, racing thoughts, grandiosity and goal-directed bevavior c/w  manic or hypomanic episode at this encounter. Pt reports regular sleep schedule.     Review of Systems:  Denies fever, chills, chest pain, dyspnea, dizziness, syncope, recent falls, headaches, nausea, and vomiting.      Current Outpatient Medications:   .  ammonium lactate (AMLACTIN) 12 % lotion, Apply 1 Application topically 2 times daily. Rub in to affected area well for dry skin, Disp: 1 bottle, Rfl: 11  .  ARIPiprazole (ABILIFY) 30 MG tablet, Take 1 tablet (30 mg) by mouth daily., Disp: 90 tablet, Rfl: 1  .  aspirin 81 MG tablet, Take 81 mg by mouth daily., Disp: , Rfl:   .  atorvastatin (LIPITOR) 10 MG tablet, Take 1 tablet (10 mg) by mouth daily., Disp: 90 tablet, Rfl: 3  .  BIOTIN PO, Take 1 capsule by mouth daily., Disp: , Rfl:   .  Calcium Carbonate-Vit D-Min (CALCIUM 1200 PO), Take 1 tablet by mouth daily., Disp: , Rfl:   .  celecoxib (CELEBREX) 100 MG capsule, Take 1 capsule (100 mg) by mouth 2 times daily., Disp: 60 capsule, Rfl: 1  .  Cholecalciferol (VITAMIN D3) 50 MCG (2000 UT) capsule, Take 1 capsule by mouth daily., Disp: , Rfl:   .  Clobetasol Propionate (IMPOYZ) 0.025 % CREA, Apply topically., Disp: , Rfl:   .  desvenlafaxine (PRISTIQ) 50 MG TB24, Take 1 tablet (50 mg) by mouth daily., Disp: 90 tablet, Rfl: 3  .  econazole (SPECTAZOLE) 1 % cream, Apply 1 Application topically 2 times daily. Use a small amount as directed, Disp: 1 Tube, Rfl: 1  .  fluPHENAZine (PROLIXIN) 10 MG tablet, Take 1 tablet (10 mg) by mouth daily., Disp: 30 tablet, Rfl: 2  .  gabapentin (  NEURONTIN) 600 MG tablet, Take 2 tablets (1,200 mg) by mouth at bedtime., Disp: 180 tablet, Rfl: 3  .  levothyroxine (SYNTHROID) 137 MCG tablet, TK 1 T PO D, Disp: , Rfl:   .  mirabegron (MYRBETRIQ) 50 MG ER tablet, Take 50 mg by mouth daily., Disp: 30 tablet, Rfl: 3  .  nystatin (MYCOSTATIN) 100000 UNIT/GM powder, Apply 1 Application topically 3 times daily. Apply to affected area, Disp: 15 g, Rfl: 0  .  orlistat (XENICAL) 120 MG capsule,  Take 1 capsule (120 mg) by mouth 3 times daily (with meals)., Disp: 90 capsule, Rfl: 3  .  oxybutynin (DITROPAN XL) 10 MG Controlled-Release tablet, Take 1 tablet (10 mg) by mouth daily., Disp: 30 tablet, Rfl: 3  .  quetiapine (SEROQUEL) 50 MG tablet, Take 1 tablet (50 mg) by mouth 4 times daily., Disp: 360 tablet, Rfl: 1  .  sertraline (ZOLOFT) 50 MG tablet, Take 2 tablets (100 mg) by mouth daily AND 1 tablet (50 mg) every evening., Disp: 90 tablet, Rfl: 1  .  triamcinolone (KENALOG) 0.1 % ointment, Use twice a day as needed for hand eczema, Disp: 80 g, Rfl: 2  .  trihexyphenidyl (ARTANE) 2 MG tablet, Take 0.5 tablets (1 mg) by mouth 2 times daily., Disp: 90 tablet, Rfl: 1  .  ziprasidone (GEODON) 80 MG capsule, Take 1 capsule (80 mg) by mouth 2 times daily (with meals)., Disp: 180 capsule, Rfl: 1    Vital Signs:  LMP  (LMP Unknown)     Mental Status Exam:    Appearance: Fair hygiene, grooming.  Behavior: Cooperative. Calm.  Gait: No gait abnormality.  Muscle Strength/Tone:Mild to moderate bilateral upper extremity mixed tremor  Speech: Normal rate, tone and volume  Mood: "OK"  Affect:Mildly flattened  Thought Process: Linear, goal-directed   - Associations: Intact  Thought Content: Denies suicidal or homicidal ideation, intent and plan. Denies auditory or visual hallucinations, but appears distractible and internally preoccupied. Endorses thought transmission.  Attention Span and Concentration: Mildly distracted in conversation  Language: Able to name objects  Fund of Knowledge: Average  Memory: Both recent and remote memories are intact in context of conversation  Orientation: Intact to person, place and situation  Insight/Judgment:Improved    LABS/ OTHER STUDIES    CHEM 7  Lab Results   Component Value Date/Time    NA 141 11/26/2018 05:12 AM    K 4.5 11/26/2018 05:12 AM    CL 105 11/26/2018 05:12 AM    BUN 18 11/26/2018 05:12 AM    GLU 90 11/26/2018 05:12 AM    [place] 9.7 11/26/2018 05:12 AM     LFT's  Lab  Results   Component Value Date/Time    AST 15 11/26/2018 05:12 AM    ALT 9 11/26/2018 05:12 AM    ALB 4.2 11/26/2018 05:12 AM    TP 6.9 11/26/2018 05:12 AM     CBC  Lab Results   Component Value Date/Time    WBC 4.6 07/09/2018 07:41 AM    HGB 13.5 12/12/2018 11:47 AM    HGB 13.5 07/09/2018 07:41 AM    HCT 40.6 12/12/2018 11:47 AM    HCT 40.4 07/09/2018 07:41 AM    PLT 178 12/12/2018 11:47 AM    PLT 182 07/09/2018 07:41 AM     LIPID PANEL  Lab Results   Component Value Date/Time    CHOL 182 11/26/2018 05:12 AM    HDL 80 11/26/2018 05:12 AM    TRIG 66  11/26/2018 05:12 AM     HGBA1c  No components found for: A1C;3  Miscellaneous   TSH   Date Value Ref Range Status   11/26/2018 0.86 0.40 - 4.50 mIU/L Final        Scales:   [place] PHQ9 DEPRESSION QUESTIONNAIRE 10/10/2018   Interest 0   Depressed 1   Sleep --   Energy --   Appetite --   Failure --   Concentration --   Movement --   Suicide --   Summary(Manual) --   Summary(Calculated) --   Functional --   Some recent data might be hidden     No flowsheet data found.    AIMS (02/07/18):  Facial & Oral Movements: 6  Extremity movements: 3  Trunk Movement: 0  Global Judgment: 1  Total: 10    AIMS (02/20/17):  Facial & Oral Movements: 0  Extremity movements: 2  Trunk Movement: 0  Global Judgment: 1  Total: 3    Assessment:   Julie Monroe a41 year oldF with a past psychiatric history of schizoaffective d/o, bipolar type, OCD and unspecified insomnia who presents for follow-up.Pt demonstrates chronic, though intervally worsened, psychotic s/s incl thought transmission and internal preoccupation. Pt reports stably improved mood, associated depressive symptoms, but intervally worsened anxiety and psychotic symptoms. Provider reviewed and reconciled medication list, as above, with plans to continue recently lowered fluphenazine and increased trihexyphenidyl, while monitoring tremulousness and psychotic s/s. May consider clozapine if psychotic s/s persist.    Shared decision  made to downtitrate quetiapine and monitor rebound insomnia.     She is assessed to be at alow acute risk of suicide.   Clinical Global Impression - Severity:4= Moderately ill  :Suggested Guidelines= Overt symptomatology causing noticeable, but modest, functional impairment. .    Diagnosis/Diagnoses:     ICD-10-CM ICD-9-CM   1. Schizoaffective disorder, bipolar type (CMS-HCC)  F25.0 295.70   2. Obsessive-compulsive disorder, unspecified type  F42.9 300.3   3. Tremor  R25.1 781.0   4. Anxiety  F41.9 300.00   5. Insomnia, unspecified type  G47.00 780.52     Plan:  Medication Management: Risks, benefits, and alternatives of medications were discussed today with Julie Monroe and she consents to regimen.  #Psychosis:   Continue fluphenazine 5 mg PO qDaily   Continue aripiprazole 30 mgPOqDaily   Continue ziprasidone 80 mg PO BID   D/c quetiapine  #EPS   Continuetrihedyphenidyl(Artane)3 mg POqDailyfor EPS  # Anxiety, OCD   Continue gabapentin 1200 mgPO QHS foranxiety and insomnia   Continuesertraline 100 mg QAM / 50 mg PO QNoon   Continue desvenlafaxine 50 mgqDaily    In-session psychotherapy:  I spent 18 minutes providing psychotherapy for this encounter.  Treatment modality(ies) employed: Psychoeducation and supportive  Progress to date: Improving  -- Psychoeducation wrt alternative medication regimens incl clozapine (and briefly reviewed ECT)      Supportive psychotherapy wrt continued restriction of preferred activities due to COVID-19  Treatment plan: Continue psychoeducation and supportive  Level of Care: Pt currently does not meet criteria for inpatient psychiatric hospitalization.    Note Author:  Newt Lukes, MD  Resident/Fellow Physician

## 2018-12-31 ENCOUNTER — Ambulatory Visit: Payer: Medicare Other | Admitting: Psychiatry

## 2018-12-31 NOTE — Progress Notes (Signed)
Attending Note:  The clinical history was reviewed with the resident physician. The patient was seen, evaluated, and the care plan developed with the resident physician. I was present with the resident during the review of the critical elements of the treatment plan and clinical interview. I agree with the findings and plan as documented. Any additions or revisions are included in the record.   Please see resident documentation for further details of our assessment and treatment recommendations.   Time spent providing psychotherapy was 18+ minutes.

## 2019-01-01 ENCOUNTER — Encounter: Payer: Self-pay | Admitting: Psychiatry

## 2019-01-06 ENCOUNTER — Telehealth: Payer: Self-pay | Admitting: Internal Medicine

## 2019-01-06 NOTE — Telephone Encounter (Signed)
Patient urgently requests specialty bariatric follow up office visit, between 11 30 2020 - 12 11 2020 late morning to early afternoon (coming from [place] 2 hours.)    Offered earlier visits; she declined (too early for commute time).    Okay to leave a voice message. Thank you.

## 2019-01-07 ENCOUNTER — Encounter: Payer: Self-pay | Admitting: Psychiatry

## 2019-01-07 ENCOUNTER — Ambulatory Visit: Payer: Medicare Other | Admitting: Internal Medicine
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
## 2019-01-13 ENCOUNTER — Ambulatory Visit: Payer: Medicare Other | Admitting: Urology

## 2019-01-13 ENCOUNTER — Telehealth: Payer: Self-pay

## 2019-01-13 ENCOUNTER — Ambulatory Visit: Payer: Medicare Other | Attending: Internal Medicine | Admitting: Internal Medicine

## 2019-01-13 ENCOUNTER — Ambulatory Visit: Payer: Medicare Other | Admitting: Internal Medicine

## 2019-01-13 DIAGNOSIS — E039 Hypothyroidism, unspecified: Secondary | ICD-10-CM

## 2019-01-13 DIAGNOSIS — F250 Schizoaffective disorder, bipolar type: Secondary | ICD-10-CM | POA: Insufficient documentation

## 2019-01-13 DIAGNOSIS — E782 Mixed hyperlipidemia: Secondary | ICD-10-CM | POA: Insufficient documentation

## 2019-01-13 DIAGNOSIS — R079 Chest pain, unspecified: Secondary | ICD-10-CM | POA: Insufficient documentation

## 2019-01-13 DIAGNOSIS — F419 Anxiety disorder, unspecified: Secondary | ICD-10-CM | POA: Insufficient documentation

## 2019-01-13 DIAGNOSIS — R7989 Other specified abnormal findings of blood chemistry: Secondary | ICD-10-CM | POA: Insufficient documentation

## 2019-01-13 DIAGNOSIS — R635 Abnormal weight gain: Secondary | ICD-10-CM | POA: Insufficient documentation

## 2019-01-13 NOTE — Telephone Encounter (Signed)
Left message for patient that appointment has been scheduled for Tuesday, January 14, 2019 at 1:10 pm.  If not able to keep appointment or need to reschedule to contact the office at [phone].

## 2019-01-13 NOTE — Progress Notes (Signed)
TELEMEDICINE VISIT       The following medical visit occurred in the form of a telemedicine visit instead of an in person face-to-face visit. Consent for telemedicine visit obtained from patient prior to the start of the visit.     Patient Questionnaire responses confirmed prior to onset of this visit:  Are you in state of [place]?: Yes  Do you consent to proceed with the Video Visit today?: Yes    The following items were discussed in detail during this visit:    History of Present Illness:  Date of Evaluation: January 13, 2019    HISTORY OF PRESENT ILLNESS:   Julie Monroe is a 73 year old year old female who presents today for follow up. LOV on 12/02/2018.     Atypical chest pain - did see cardiology Dr. Linward Headland on 12/16/2018 who did not think any further eval or work up was needed, but to monitor for sxs.  No further symptoms since then. Follow up on 03/18/2019.    Mild aortic valve stenosis - plan is to f/u on this with repeat 2d echo 1 year from last.     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  Cards is aware and is monitoring.    HLD - chol panel from 11/26/2018 shows:  TC 182, Tg 62, HDL 79, LDL 146. On Lipitor 84m daily.    Hypothyroid - on Synthroid 1349m; TSH 0.86, FT4 1.3.    Weight concern - not dieting or exercising.  Husband sees LiQuin HoopP for weight mgmt and she would like to see the same.  Was seeing Dr. NaJulianne Handlerut not recently.  Would also like to see nutritionist as well.  Was on Orlistat but stopped bc she wasn't eating 3 discrete meals so did not know how to dose it.    Bruises on legs, small, is on Asa 8175maily. CBC from 12/12/2018 is normal. WBC 5.8, Hgb 13.5, Plts 178.    Worsening depression - seeing psychiatrist, will f/u with him about this.  Has appt with him next week. Seems to be worse in the evening.  On Geodon, Zoloft, Fluphenazine, Pristiq, Abilify and Artane.  Also takes Gabapentin for sleep.  Depression is not severe but is persistent.      Last seen by DR. Park on  12/24/2018,  Med regimen as follows:    #Psychosis:   Continue fluphenazine 5 mg PO qDaily   Continue aripiprazole 30 mgPOqDaily   Continue ziprasidone 80 mg PO BID   D/c quetiapine  #EPS   Continuetrihedyphenidyl(Artane)3 mg POqDailyfor EPS  # Anxiety, OCD   Continue gabapentin 1200 mgPO QHS foranxiety and insomnia   Continuesertraline 100 mg QAM / 50 mg PO QNoon   Continue desvenlafaxine 50 mgqDaily    Labs from 11/26/2018 reviewed with patient.         Lab Results   Component Value Date    VDT 34 11/26/2018     Diabetes screen:  Lab Results   Component Value Date    A1C 5.3 11/26/2018     Thyroid:    Lab Results   Component Value Date    TSH 0.86 11/26/2018     No results found for: COLORUA, APPEARUA, GLUR[place]IUA, [place]UA, BLOODUA, PHUA, PROTEINUA, UROBILUA, NITRITEUA, LEUKESTUA, WBCUA, RBCUA, EPITHCELLSUA, HYALINEUA, CRYSTALSUA, COMMENTSUA    Lab Results   Component Value Date    WBC 4.6 07/09/2018    RBC 4.64 12/12/2018    HGB 13.5 12/12/2018  HCT 40.6 12/12/2018    MCV 87.6 12/12/2018    MCHC 33.2 12/12/2018    RDW 13.9 12/12/2018    PLT 178 12/12/2018    MPV 11.7 07/09/2018     Lab Results   Component Value Date    BUN 18 11/26/2018    CREAT 0.81 11/26/2018    CL 105 11/26/2018    NA 141 11/26/2018    K 4.5 11/26/2018    [place] 9.7 11/26/2018    TBILI 1.0 11/26/2018    ALB 4.2 11/26/2018    TP 6.9 11/26/2018    AST 15 11/26/2018    ALK 101 11/26/2018    BICARB 30 11/26/2018    ALT 9 11/26/2018    GLU 90 11/26/2018     Lab Results   Component Value Date    TSH 0.86 11/26/2018     Lab Results   Component Value Date    CHOL 182 11/26/2018    HDL 80 11/26/2018    TRIG 66 11/26/2018     The 10-year ASCVD risk score Mikey Bussing DC Jr., et al., 2013) is: 11.2%    Values used to calculate the score:      Age: 23 years      Sex: Female      Is Non-Hispanic African American: No      Diabetic: No      Tobacco smoker: No      Systolic Blood Pressure: 182 mmHg      Is BP treated: No      HDL  Cholesterol: 80 mg/dL      Total Cholesterol: 182 mg/dL       Past Medical and Surgical History:  Past Medical History:   Diagnosis Date   . Bipolar affect, depressed (CMS-HCC)    . Chronic back pain    . Hypothyroidism    . Insomnia    . OAB (overactive bladder)    . OCD (obsessive compulsive disorder)    . Osteopenia    . Pure hypercholesterolemia 10/10/2018   . Schizophrenia (CMS-HCC)      Past Surgical History:   Procedure Laterality Date   . Biilat. Knee replacement     . Rt toe surgery     . TOTAL KNEE ARTHROPLASTY Bilateral        Immunization History:  Immunization History   Administered Date(s) Administered   . (Shingles) Herpes Zoster Vaccine (ZOSTAVAX) 04/22/2014   . Influenza Vaccine (High Dose) >=65 Years 11/04/2017   . Influenza Vaccine (Unspecified) 10/26/2015   . Pneumococcal 13 Vaccine (PREVNAR-13) 10/19/2014   . Pneumococcal 23 Vaccine (PNEUMOVAX-23) 10/17/2015   . Tdap 02/14/2016       Allergies:  No Known Allergies    Medications:  Current Outpatient Medications   Medication Sig Dispense Refill   . ammonium lactate (AMLACTIN) 12 % lotion Apply 1 Application topically 2 times daily. Rub in to affected area well for dry skin 1 bottle 11   . ARIPiprazole (ABILIFY) 30 MG tablet Take 1 tablet (30 mg) by mouth daily. 90 tablet 1   . aspirin 81 MG tablet Take 81 mg by mouth daily.     Marland Kitchen atorvastatin (LIPITOR) 10 MG tablet Take 1 tablet (10 mg) by mouth daily. 90 tablet 3   . BIOTIN PO Take 1 capsule by mouth daily.     . Calcium Carbonate-Vit D-Min (CALCIUM 1200 PO) Take 1 tablet by mouth daily.     . celecoxib (CELEBREX) 100 MG capsule Take 1 capsule (  100 mg) by mouth 2 times daily. 60 capsule 1   . Cholecalciferol (VITAMIN D3) 50 MCG (2000 UT) capsule Take 1 capsule by mouth daily.     . Clobetasol Propionate (IMPOYZ) 0.025 % CREA Apply topically.     Marland Kitchen desvenlafaxine (PRISTIQ) 50 MG TB24 Take 1 tablet (50 mg) by mouth daily. 90 tablet 3   . econazole (SPECTAZOLE) 1 % cream Apply 1 Application  topically 2 times daily. Use a small amount as directed 1 Tube 1   . fluPHENAZine (PROLIXIN) 10 MG tablet Take 1 tablet (10 mg) by mouth daily. 30 tablet 2   . gabapentin (NEURONTIN) 600 MG tablet Take 2 tablets (1,200 mg) by mouth at bedtime. 180 tablet 3   . levothyroxine (SYNTHROID) 137 MCG tablet TK 1 T PO D     . mirabegron (MYRBETRIQ) 50 MG ER tablet Take 50 mg by mouth daily. 30 tablet 3   . nystatin (MYCOSTATIN) 100000 UNIT/GM powder Apply 1 Application topically 3 times daily. Apply to affected area 15 g 0   . orlistat (XENICAL) 120 MG capsule Take 1 capsule (120 mg) by mouth 3 times daily (with meals). 90 capsule 3   . oxybutynin (DITROPAN XL) 10 MG Controlled-Release tablet Take 1 tablet (10 mg) by mouth daily. 30 tablet 3   . sertraline (ZOLOFT) 50 MG tablet Take 2 tablets (100 mg) by mouth daily AND 1 tablet (50 mg) every evening. 90 tablet 1   . triamcinolone (KENALOG) 0.1 % ointment Use twice a day as needed for hand eczema 80 g 2   . trihexyphenidyl (ARTANE) 2 MG tablet Take 0.5 tablets (1 mg) by mouth 2 times daily. 90 tablet 1   . ziprasidone (GEODON) 80 MG capsule Take 1 capsule (80 mg) by mouth 2 times daily (with meals). 180 capsule 1     No current facility-administered medications for this visit.        Social History:  Social History     Socioeconomic History   . Marital status: Married     Spouse name: Not on file   . Number of children: Not on file   . Years of education: Not on file   . Highest education level: Not on file   Occupational History   . Not on file   Tobacco Use   . Smoking status: Former Smoker     Packs/day: 2.00     Years: 30.00     Pack years: 60.00   . Smokeless tobacco: Never Used   Substance and Sexual Activity   . Alcohol use: No     Frequency: Never   . Drug use: No   . Sexual activity: Never   Social Activities of Daily Living Present   . Not on file   Social History Narrative   . Not on file       Family History:  Family History   Problem Relation Name Age of Onset     . Other Mother          Wegener   . Cancer Brother          Leukemia   . Cancer Father          Leukemia   . Heart Disease Father     . Hypertension Father     . No Known Problems Sister     . Cancer Brother          Bladder Cancer  Objective:    No vitals signs and very limited, if any, physical examination done as discussion was not in person.      Assessment and Plan:    1. Weight gain  May be multifactorial - is not eating a large quantity of food but admits to not exercising. Will refer to nutritionist.  Will also ask pt to f/u with Dr. Julianne Handler - consider restarting on Orlistat or possibly another agent. Also discussed that some of her weight gain may be 2/2 to SE from her psychotropic meds (Geodon, Abilify) - will review with psychiatrist.   - Referral to Dietician; Future  - Glycosylated Hgb(A1C), Blood Lavender; Future  - Glycosylated Hgb(A1C), Blood Lavender    2. Hypothyroidism, unspecified type  TFTs at goal, continue current regimen.   - TSH, Blood - See Instructions; Future  - Free Thyroxine, Blood - See Instructions; Future  - TSH, Blood - See Instructions  - Free Thyroxine, Blood - See Instructions    3. Schizoaffective disorder, bipolar type (CMS-HCC)  On extensive regimen as noted above, following with Psychiatry.  Will f/u with Psych to review her worsening mood sxs at night time, also weight gain.   - Hemogram Lavender; Future  - TSH, Blood - See Instructions; Future  - Free Thyroxine, Blood - See Instructions; Future  - Hemogram Lavender  - TSH, Blood - See Instructions  - Free Thyroxine, Blood - See Instructions    4. Morbid obesity (CMS-HCC)  See above.  - Glycosylated Hgb(A1C), Blood Lavender; Future  - Glycosylated Hgb(A1C), Blood Lavender    5. Chest pain, unspecified type  Atypical, no further sxs, cardiology recommends no further workup for now.  Will monitor.     6. Mixed hyperlipidemia  Continue on current regimen of statin, but will repeat labs and see if statin dose can be  optimized.   - Comprehensive Metabolic Panel - See Instructions; Future  - Lipid Panel Green Plasma Separator Tube; Future  - Comprehensive Metabolic Panel - See Instructions  - Lipid Panel Green Plasma Separator Tube    7. Anxiety disorder, unspecified type   See above.         All the above issues were fully discussed with patient during our telephone conversation and patient is aware of all of these issues as outlined above.         Charlotte Crumb, MD, MPH    Due to COVID-19 pandemic and a federally declared state of public health emergency, this service is being conducted via telephone.  Patient consented to proceeding with telemedicine visit today. Total time spent was 21+ minutes. The telemedicine visit was conducted with Audio Only

## 2019-01-13 NOTE — Telephone Encounter (Signed)
patient is calling to reschedule echo. Please call back thank you

## 2019-01-14 ENCOUNTER — Encounter: Payer: Self-pay | Admitting: Psychiatry

## 2019-01-14 ENCOUNTER — Ambulatory Visit: Payer: Medicare Other | Admitting: Internal Medicine

## 2019-01-14 ENCOUNTER — Encounter: Payer: Self-pay | Admitting: Urology

## 2019-01-14 ENCOUNTER — Telehealth (INDEPENDENT_AMBULATORY_CARE_PROVIDER_SITE_OTHER): Payer: Medicare Other | Admitting: Urology

## 2019-01-14 DIAGNOSIS — N3941 Urge incontinence: Secondary | ICD-10-CM

## 2019-01-14 MED ORDER — OXYBUTYNIN CHLORIDE 10 MG OR TB24
10.0000 mg | ORAL_TABLET | Freq: Every day | ORAL | 3 refills | Status: DC
Start: 2019-01-14 — End: 2019-09-16

## 2019-01-14 MED ORDER — MIRABEGRON ER 50 MG PO TB24
50.0000 mg | ORAL_TABLET | Freq: Every day | ORAL | 3 refills | Status: DC
Start: 2019-01-14 — End: 2019-05-29

## 2019-01-14 NOTE — Progress Notes (Signed)
Due to COVID-19 pandemic and a federally declared state of public health emergency, this telemedicine visit was conducted Audio Only. Total time spent was 11-20 minutes.     Patient confirmed location in [place] and consented to telephone visit    Urology Follow Up Note      HISTORY OF PRESENT ILLNESS:  Julie Monroe is a 73 year old-year-old woman here for follow up. She has a history ofurgency urinary incontinence. Cystoscopy on12/16/19demonstrated no lesions. She has been treated withoxybutyninand with PTNS x 4 sessions.She was doing well for some time after the PTNS, but she stopped the treatments because she did not feel it was helping. Then her symptoms started to worsen again with urgency and urgency incontinence. She did not wish to proceed with third line therapy so we started dual medical therapy with myrbetriq and oxybutynin.    Today she reports the dual medical therapy is working well. She has daytime frequency every 1 hour. She has a little leakage if she holds her urine for too long. She has no side effects from medication. Blood pressure has been normal.    Review of Systems - no fevers or dysuria      Current Outpatient Medications:   .  ammonium lactate (AMLACTIN) 12 % lotion, Apply 1 Application topically 2 times daily. Rub in to affected area well for dry skin, Disp: 1 bottle, Rfl: 11  .  ARIPiprazole (ABILIFY) 30 MG tablet, Take 1 tablet (30 mg) by mouth daily., Disp: 90 tablet, Rfl: 1  .  aspirin 81 MG tablet, Take 81 mg by mouth daily., Disp: , Rfl:   .  atorvastatin (LIPITOR) 10 MG tablet, Take 1 tablet (10 mg) by mouth daily., Disp: 90 tablet, Rfl: 3  .  BIOTIN PO, Take 1 capsule by mouth daily., Disp: , Rfl:   .  Calcium Carbonate-Vit D-Min (CALCIUM 1200 PO), Take 1 tablet by mouth daily., Disp: , Rfl:   .  celecoxib (CELEBREX) 100 MG capsule, Take 1 capsule (100 mg) by mouth 2 times daily., Disp: 60 capsule, Rfl: 1  .  Cholecalciferol (VITAMIN D3) 50 MCG (2000 UT) capsule, Take 1 capsule by  mouth daily., Disp: , Rfl:   .  Clobetasol Propionate (IMPOYZ) 0.025 % CREA, Apply topically., Disp: , Rfl:   .  desvenlafaxine (PRISTIQ) 50 MG TB24, Take 1 tablet (50 mg) by mouth daily., Disp: 90 tablet, Rfl: 3  .  econazole (SPECTAZOLE) 1 % cream, Apply 1 Application topically 2 times daily. Use a small amount as directed, Disp: 1 Tube, Rfl: 1  .  fluPHENAZine (PROLIXIN) 10 MG tablet, Take 1 tablet (10 mg) by mouth daily., Disp: 30 tablet, Rfl: 2  .  gabapentin (NEURONTIN) 600 MG tablet, Take 2 tablets (1,200 mg) by mouth at bedtime., Disp: 180 tablet, Rfl: 3  .  levothyroxine (SYNTHROID) 137 MCG tablet, TK 1 T PO D, Disp: , Rfl:   .  mirabegron (MYRBETRIQ) 50 MG ER tablet, Take 50 mg by mouth daily., Disp: 30 tablet, Rfl: 3  .  nystatin (MYCOSTATIN) 100000 UNIT/GM powder, Apply 1 Application topically 3 times daily. Apply to affected area, Disp: 15 g, Rfl: 0  .  orlistat (XENICAL) 120 MG capsule, Take 1 capsule (120 mg) by mouth 3 times daily (with meals)., Disp: 90 capsule, Rfl: 3  .  oxybutynin (DITROPAN XL) 10 MG Controlled-Release tablet, Take 1 tablet (10 mg) by mouth daily., Disp: 30 tablet, Rfl: 3  .  sertraline (ZOLOFT) 50 MG tablet, Take  2 tablets (100 mg) by mouth daily AND 1 tablet (50 mg) every evening., Disp: 90 tablet, Rfl: 1  .  triamcinolone (KENALOG) 0.1 % ointment, Use twice a day as needed for hand eczema, Disp: 80 g, Rfl: 2  .  trihexyphenidyl (ARTANE) 2 MG tablet, Take 0.5 tablets (1 mg) by mouth 2 times daily., Disp: 90 tablet, Rfl: 1  .  ziprasidone (GEODON) 80 MG capsule, Take 1 capsule (80 mg) by mouth 2 times daily (with meals)., Disp: 180 capsule, Rfl: 1    Past Medical History:   Diagnosis Date   . Bipolar affect, depressed (CMS-HCC)    . Chronic back pain    . Hypothyroidism    . Insomnia    . OAB (overactive bladder)    . OCD (obsessive compulsive disorder)    . Osteopenia    . Pure hypercholesterolemia 10/10/2018   . Schizophrenia (CMS-HCC)        No Known  Allergies      ASSESSMENT:  Julie Monroe is a 73 year old woman with urgency urinary incontinence, doing well on medical therapy.      PLAN:  Today we discussed her symptoms and treatment in detail.  She is on dual medical therapy with Mirabegron and oxybutynin in doing well with this with minimal symptoms.  She would like to continue with this regimen and asked if there are any concerns with being on these medications for the long-term.  We reviewed that her blood pressure has had good control on Mirabegron and I do not see this as an issue.  I also explained that use of oxybutynin can cause some cognitive changes however quality of life improvements may outweigh these risks and she believes this to be true in her case.  I will have her see me in follow-up in 1 year, but I have encouraged her to contact me sooner should she have any concerns regarding her bladder.

## 2019-01-15 ENCOUNTER — Telehealth: Payer: Self-pay | Admitting: Internal Medicine

## 2019-01-15 NOTE — Telephone Encounter (Signed)
Per husband patient was unaware of yesterdays appointment states for future reference can patient be called if there is an appointment made for her thank you

## 2019-01-16 ENCOUNTER — Encounter: Payer: Self-pay | Admitting: Internal Medicine

## 2019-01-20 NOTE — Telephone Encounter (Signed)
Per patient states she never got a message about her appointment last week that she missed.   Patient doesn't want to be penalized because she didn't set up that appointment.   Please follow up if needed

## 2019-01-21 ENCOUNTER — Ambulatory Visit: Payer: Medicare Other | Admitting: Psychiatry

## 2019-01-21 ENCOUNTER — Ambulatory Visit: Payer: Medicare Other | Admitting: Pain Medicine

## 2019-01-21 ENCOUNTER — Encounter: Payer: Medicare Other | Admitting: Psychiatry

## 2019-01-21 NOTE — Telephone Encounter (Signed)
Patient is calling states that she is follow up on the message below     Transferred call to the clinic

## 2019-01-21 NOTE — Telephone Encounter (Addendum)
Talked with patient and cancelled her appt on 01-15-19. Patient states they were not aware of the appointment and did not receive the message until after. She did not want it marked as a no-show.     Patient made appt for 03-24-18 and was also placed on a waitlist for a sooner appt

## 2019-01-22 ENCOUNTER — Ambulatory Visit: Payer: Medicare Other | Attending: Pain Medicine | Admitting: Pain Medicine

## 2019-01-22 ENCOUNTER — Telehealth: Payer: Self-pay | Admitting: Internal Medicine

## 2019-01-22 DIAGNOSIS — F250 Schizoaffective disorder, bipolar type: Secondary | ICD-10-CM | POA: Insufficient documentation

## 2019-01-22 DIAGNOSIS — M533 Sacrococcygeal disorders, not elsewhere classified: Secondary | ICD-10-CM | POA: Insufficient documentation

## 2019-01-22 DIAGNOSIS — M47816 Spondylosis without myelopathy or radiculopathy, lumbar region: Secondary | ICD-10-CM

## 2019-01-22 DIAGNOSIS — Z7982 Long term (current) use of aspirin: Secondary | ICD-10-CM | POA: Insufficient documentation

## 2019-01-22 DIAGNOSIS — F419 Anxiety disorder, unspecified: Secondary | ICD-10-CM | POA: Insufficient documentation

## 2019-01-22 DIAGNOSIS — Z791 Long term (current) use of non-steroidal anti-inflammatories (NSAID): Secondary | ICD-10-CM | POA: Insufficient documentation

## 2019-01-22 DIAGNOSIS — E782 Mixed hyperlipidemia: Secondary | ICD-10-CM | POA: Insufficient documentation

## 2019-01-22 DIAGNOSIS — M48061 Spinal stenosis, lumbar region without neurogenic claudication: Secondary | ICD-10-CM | POA: Insufficient documentation

## 2019-01-22 DIAGNOSIS — E039 Hypothyroidism, unspecified: Secondary | ICD-10-CM | POA: Insufficient documentation

## 2019-01-22 DIAGNOSIS — M7918 Myalgia, other site: Secondary | ICD-10-CM | POA: Insufficient documentation

## 2019-01-22 DIAGNOSIS — Z79899 Other long term (current) drug therapy: Secondary | ICD-10-CM | POA: Insufficient documentation

## 2019-01-22 DIAGNOSIS — F429 Obsessive-compulsive disorder, unspecified: Secondary | ICD-10-CM | POA: Insufficient documentation
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
## 2019-01-22 NOTE — Telephone Encounter (Signed)
Pt requesting to get sooner appt with Dr Leonette Monarch. First available so far is  03/06/2019. Would like to see if can accommodate sooner . [facility] assist and call her back if there's any . Okay for telemed. Thank you

## 2019-01-22 NOTE — Telephone Encounter (Signed)
01/22/19 3:46 pm called patient relayed message to patient. Patient verbalized understanding. raranda ma     Patient wants to be on Dr Leonette Monarch wait list.     Forward message to [facility]

## 2019-01-22 NOTE — Telephone Encounter (Signed)
Rosie, Please let her know we can add her to Dr. Eliseo Squires wait list if any sooner cancellations occur. Please let her know about our [facility] if her need is urgent. Thank you.

## 2019-01-30 ENCOUNTER — Ambulatory Visit: Payer: Medicare Other | Admitting: Pain Medicine

## 2019-01-30 ENCOUNTER — Telehealth: Payer: Self-pay

## 2019-02-03 ENCOUNTER — Ambulatory Visit: Payer: Medicare Other

## 2019-02-03 ENCOUNTER — Ambulatory Visit: Payer: Medicare Other | Admitting: Ob/Gyn

## 2019-02-12 ENCOUNTER — Telehealth: Payer: Self-pay | Admitting: Internal Medicine

## 2019-02-12 NOTE — Telephone Encounter (Signed)
PATIENT SEES DR NADESWARAN FOR BARIATRIC AND STATED THAT SHE WOULD LIKE TO SEE IF THERE IS ANYWAY SHE CAN HAVE A TELEMEDICINE FOR BARIATRICS BEFORE 03/25/19 PLEASE CALL PATIENT Julie Monroe YOU

## 2019-02-17 ENCOUNTER — Encounter: Payer: Self-pay | Admitting: Internal Medicine

## 2019-02-17 ENCOUNTER — Telehealth: Payer: Self-pay | Admitting: Internal Medicine

## 2019-02-17 NOTE — Telephone Encounter (Signed)
MyChart message sent notifying pt to update her insurance information via the [facility] and then notify us so we can properly submit referrals under new insurance.

## 2019-02-17 NOTE — Telephone Encounter (Signed)
Note     Patient called requesting referral for Dr. Delton See in Pain management for appointment on 1/7.    Would like another referral for Dr. Hazle Nordmann and [facility].    Patient has new  HMO insurance as of 02/14/19 and needs assistance          Please assit

## 2019-02-18 ENCOUNTER — Encounter: Payer: Medicare (Managed Care) | Admitting: Psychiatry

## 2019-02-18 NOTE — Telephone Encounter (Signed)
Orders signed - leslie, just an FYI that referrals to specialists have to be routine or stat.  You can select "urgent" but when you go to sign it, it gives you an error and forces you to select between routine or stat.      Thanks, again - they're signed.    Reita Cliche

## 2019-02-18 NOTE — Telephone Encounter (Signed)
New insurance entered.

## 2019-02-18 NOTE — Telephone Encounter (Signed)
MyChart message sent notifying patient.

## 2019-02-19 NOTE — Telephone Encounter (Signed)
02/18/2019     Called pt back and advised that the appointment set for 03/25/2019 is the first available -  Pt advised she is on the wait list - pt understands and will see Dr Loann Quill as scheduled in person.     Virgel Bouquet, RN

## 2019-02-20 ENCOUNTER — Ambulatory Visit: Payer: Medicare (Managed Care) | Admitting: Pain Medicine

## 2019-02-21 ENCOUNTER — Ambulatory Visit: Payer: Medicare (Managed Care) | Admitting: Dermatology

## 2019-02-26 ENCOUNTER — Ambulatory Visit: Payer: Medicare (Managed Care) | Admitting: Pain Medicine

## 2019-02-28 ENCOUNTER — Telehealth: Payer: Self-pay | Admitting: Internal Medicine

## 2019-02-28 NOTE — Telephone Encounter (Signed)
Pt requesting to be seen via telemed on same day spouse is being seen which is on 03/10/2019 @2pm , please call her back thank you.

## 2019-03-04 ENCOUNTER — Other Ambulatory Visit: Payer: Self-pay | Admitting: Pain Medicine

## 2019-03-04 ENCOUNTER — Encounter: Payer: Self-pay | Admitting: Pain Medicine

## 2019-03-04 ENCOUNTER — Ambulatory Visit
Admission: RE | Admit: 2019-03-04 | Discharge: 2019-03-04 | Disposition: A | Discharge status: Home / Self Care | Payer: Medicare (Managed Care) | Attending: Pain Medicine | Admitting: Pain Medicine

## 2019-03-04 ENCOUNTER — Ambulatory Visit (HOSPITAL_BASED_OUTPATIENT_CLINIC_OR_DEPARTMENT_OTHER): Payer: Medicare (Managed Care) | Admitting: Pain Medicine

## 2019-03-04 VITALS — BP 135/65 | HR 66 | Temp 98.0°F | Resp 20 | Ht 66.0 in | Wt 276.5 lb

## 2019-03-04 DIAGNOSIS — Z87891 Personal history of nicotine dependence: Secondary | ICD-10-CM | POA: Insufficient documentation

## 2019-03-04 DIAGNOSIS — M533 Sacrococcygeal disorders, not elsewhere classified: Secondary | ICD-10-CM

## 2019-03-04 DIAGNOSIS — M4726 Other spondylosis with radiculopathy, lumbar region: Secondary | ICD-10-CM | POA: Insufficient documentation

## 2019-03-04 DIAGNOSIS — E782 Mixed hyperlipidemia: Secondary | ICD-10-CM | POA: Insufficient documentation

## 2019-03-04 DIAGNOSIS — Z79899 Other long term (current) drug therapy: Secondary | ICD-10-CM | POA: Insufficient documentation

## 2019-03-04 DIAGNOSIS — M48061 Spinal stenosis, lumbar region without neurogenic claudication: Secondary | ICD-10-CM

## 2019-03-04 DIAGNOSIS — Z7982 Long term (current) use of aspirin: Secondary | ICD-10-CM | POA: Insufficient documentation

## 2019-03-04 DIAGNOSIS — G8929 Other chronic pain: Secondary | ICD-10-CM

## 2019-03-04 DIAGNOSIS — M1990 Unspecified osteoarthritis, unspecified site: Secondary | ICD-10-CM

## 2019-03-04 DIAGNOSIS — E7800 Pure hypercholesterolemia, unspecified: Secondary | ICD-10-CM | POA: Insufficient documentation

## 2019-03-04 DIAGNOSIS — Z791 Long term (current) use of non-steroidal anti-inflammatories (NSAID): Secondary | ICD-10-CM | POA: Insufficient documentation

## 2019-03-04 DIAGNOSIS — Z6841 Body Mass Index (BMI) 40.0 and over, adult: Secondary | ICD-10-CM | POA: Insufficient documentation

## 2019-03-04 DIAGNOSIS — E039 Hypothyroidism, unspecified: Secondary | ICD-10-CM | POA: Insufficient documentation

## 2019-03-04 MED ORDER — TRIAMCINOLONE ACETONIDE 40 MG/ML IJ SUSP
80.0000 mg | Freq: Once | INTRAMUSCULAR | Status: AC
Start: 2019-03-04 — End: 2019-03-04
  Administered 2019-03-04: 80 mg via INTRA_ARTICULAR

## 2019-03-04 MED ORDER — LIDOCAINE HCL 1 % IJ SOLN
5.0000 mL | Freq: Once | INTRAMUSCULAR | Status: AC
Start: 2019-03-04 — End: 2019-03-04
  Administered 2019-03-04 (×2): 5 mL via SUBCUTANEOUS

## 2019-03-04 MED ORDER — IOHEXOL 240 MG/ML IJ SOLN
10.0000 mL | Freq: Once | INTRAMUSCULAR | Status: AC
Start: 2019-03-04 — End: 2019-03-04
  Administered 2019-03-04: 10 mL via INTRA_ARTICULAR

## 2019-03-04 NOTE — Progress Notes (Signed)
B/L SIJ inj    Pt denies blood thinner, abx, DM  Pt has driver    Pt tolerated procedure well.   VS stable and no active bleeding.  Safety and home instructions given; pt receptive.

## 2019-03-04 NOTE — Patient Instructions (Addendum)
Home PT exercises - [email or web address]    [facility]   ?  Procedure Performed: Sacroiliac Joint Injections on both sides   ?  Should I limit my activity or change my diet after the injection?   No, unless your doctor tells you otherwise you may return to your usual activities. Please avoid strenuous activity or travel for 3 days after the procedure.   ?  Are there any side effects caused by these procedures?   You should call your pain Physician if you develop any of the following symptoms:   Redness, swelling, bleeding or discharge from the injection site.   ?  Are there any restrictions after my procedure?   Yes. You should follow these restrictions:   -Do not drive for the remainder of the day.   -Do not take a tub bath or soak in water (i.e. pool, hot tub) for 24 hours after procedure.   -Allow 3 days post-procedure before resuming physical therapy   -If you are a diabetic, please closely monitor your glucose levels. Steroids may cause a temporary increase in these levels    ?  What are the medications used for my procedure?   Local anesthetics (Numbing medicine)  Contrast agent into the area to confirm correct placement   Steroid   ?  When should I call my doctor?   You should call us immediately if any of the following occur:   -If you experience any redness, swelling, bleeding or discharge from the injection site.   -If you experience new or worsening back or neck pain.  -If you experience a new numbness or weakness in your arms or legs.  -If you experience any chest pain or shortness of breath.  ?  How can I reach a doctor from the Center?   There is a doctor on call 24 hours a day, 7 days a week who can be contacted as follows:   Marland Kitchen DURING OFFICE HOURS (Monday thru Friday, 8:00am to 4:00pm), please call [phone] and leave a message for the physician on the nurses' line.   . AFTER HOURS AND ON WEEKENDS, please call [phone] and ask the hospital operator to page the  physician on call for the Chronic Pain Service Pager [phone]. You may also leave a detailed voicemail at our [facility] [phone]. This message will be automatically sent to the on-call physician as well.  . IF FOR ANY REASON YOU ARE UNABLE TO REACH A PHYSICIAN FROM OUR CENTER or you are having any of the problems listed above, please go to the nearest Emergency Room and show them this document.
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
## 2019-03-04 NOTE — Telephone Encounter (Signed)
Called patient scheduled patient on 03/13/19 at 11:20am telephone per patient. Okay per dr [name]. Julie Monroe

## 2019-03-04 NOTE — Telephone Encounter (Signed)
I cannot overbook on Monday, already full schedule. Can overbook her on Thurs of that week at 11:20am, please offer her that appt.     Thank you.

## 2019-03-04 NOTE — Procedures (Signed)
PAIN SACROILIAC JOINT INJECTION  [facility]  [place], [place]    [place] JOINT INJECTION    Date of Procedure: 03/04/2019    Preoperative Diagnosis: Sacroiliac Dysfunction  Postoperative Diagnosis: Sacroiliac Dysfunction    Operation Title:  1) Bilateral Sacroiliac Joint Injection  2) Intraoperative Fluoroscopy    Attending Physician: Archer Asa, MD  Assistant Physician: Jacklyn Shell, MD    Anesthesia: Local     Indications: The patient is a 74 year old female with a diagnosis of sacroiliac dysfunction.The patient's history & physical exam have been reviewed. The risks, benefits and alternatives to the procedure have been discussed, and all questions have been answered to the patient's satisfaction. The patient agreed to proceed and written informed consent was obtained.    Procedure in Detail: The patient was brought into the procedure room and placed in the prone position on the fluoroscopy table. Standard monitors were placed, and vital signs were observed throughout the procedure. The area of the low back and upper buttock was prepped with chloroprep times three and draped in a sterile manner. The left sacroiliac joint was identified and marked under AP fluoroscopy. The fluoroscopic bean was then obliqued until the anterior and posterior margins of the joint were aligned. The inferior margin of the joint was identified and marked. The skin and subcutaneous tissues in the area were anesthetized with 1% lidocaine. A 25-gauge 3.5 inch needle was directed toward the identified point under fluoroscopic guidance. Once the targeted point was reached and the joint space was entered, negative aspiration was confirmed and 67ml of contrast solution was injected. An appropriate arthrogram was noted. Then, after negative aspiration, 40mg  triamcinolone and 18mL 1% lidocaine was easily injected. The needle was removed with a saline flush. The patient's back was cleaned and a  bandage was placed over the site of needle insertion. Procedure was repeated on the opposite side.     Disposition: The patient tolerated the procedure well, and there were no apparent complications. Vital signs remained stable throughout the procedure. The patient was taken to the recovery area where written discharge instructions for the procedure were given.     POST-OP PLAN IS follow up 2-4 weeks    ATTENDING PROCEDURE SUPERVISION:  I was personally present and participated in the entire procedure performed today.

## 2019-03-04 NOTE — Telephone Encounter (Signed)
Rosie, Please assist. Thank you.

## 2019-03-05 ENCOUNTER — Encounter: Payer: Self-pay | Admitting: Student in an Organized Health Care Education/Training Program

## 2019-03-05 NOTE — Progress Notes (Signed)
Patient called after-hour service. Patient recently had bilateral SI joint injection on 03/04/19. Patient's husband noticed a bruise (purple hue with clear demarcation) near the area of the RIGHT SI joint injection.     Patient denies any fevers, chills, SOB, weakness, changes in sensation. Patient denies any ecchymosis in other part of the body or frank bleeding. Patient was taking Celebrex however discontinued this on 1/19/1. Besides aspirin 81 mg, patient is not on any other anti-platelet medications.     Patient denies any falls or trauma. Per patient's chart and knowledge, she has no known allergies to any medications. Patient denies any insect bite or other inciting events.     Given that patient is clinically stable, we discussed monitoring the bruise and utilizing ice pack PRN. Patient was given medical precautions for any new symptoms of fevers, chills, increasing bleeding, or expanding ecchymosis.     Patient was advised to follow-up via video visit at the least to further observe and monitor this post-procedural change.

## 2019-03-08 ENCOUNTER — Ambulatory Visit: Payer: Medicare (Managed Care) | Attending: Critical Care

## 2019-03-08 DIAGNOSIS — Z23 Encounter for immunization: Secondary | ICD-10-CM

## 2019-03-08 NOTE — Progress Notes (Deleted)
TELEMEDICINE VISIT       The following medical visit occurred in the form of a telemedicine visit instead of an in person face-to-face visit. Consent for telemedicine visit obtained from patient prior to the start of the visit.     Patient Questionnaire responses confirmed prior to onset of this visit:  Are you in state of [place]?: Yes  Do you consent to proceed with the Video Visit today?: Yes    The following items were discussed in detail during this visit:    History of Present Illness:  Date of Evaluation: March 08, 2019    HISTORY OF PRESENT ILLNESS:   Julie Monroe is a 74 year old year old female who presents today for chief complaint of     Date of Evaluation: January 13, 2019    HISTORY OF PRESENT ILLNESS:   Julie Monroe is a 74 year old year old female who presents today for follow up. LOV on 12/02/2018.     Atypical chest pain - did see cardiology Dr. Linward Headland on 12/16/2018 who did not think any further eval or work up was needed, but to monitor for sxs.  No further symptoms since then. Follow up on 03/18/2019.    Mild aortic valve stenosis - plan is to f/u on this with repeat 2d echo 1 year from last.     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 Cards is aware and is monitoring.    HLD - chol panel from 11/26/2018 shows:  TC 182, Tg 62, HDL 79, LDL 146. On Lipitor 2m daily.    Hypothyroid - on Synthroid 1316m; TSH 0.86, FT4 1.3.    Weight concern - not dieting or exercising.  Husband sees LiQuin HoopP for weight mgmt and she would like to see the same.  Was seeing Dr. NaJulianne Handlerut not recently.  Would also like to see nutritionist as well.  Was on Orlistat but stopped bc she wasn't eating 3 discrete meals so did not know how to dose it.    Bruises on legs, small, is on Asa 8161maily. CBC from 12/12/2018 is normal. WBC 5.8, Hgb 13.5, Plts 178.    Worsening depression - seeing psychiatrist, will f/u with him about this.  Has appt with him next week. Seems to be worse in the evening.  On  Geodon, Zoloft, Fluphenazine, Pristiq, Abilify and Artane.  Also takes Gabapentin for sleep.  Depression is not severe but is persistent.      Last seen by DR. Park on 12/24/2018,  Med regimen as follows:    #Psychosis:   Continue fluphenazine 5 mg PO qDaily   Continue aripiprazole 30 mgPOqDaily   Continue ziprasidone 70m41m BID   D/c quetiapine  #EPS   Continuetrihedyphenidyl(Artane)3mg 23mDailyfor EPS  # Anxiety, OCD   Continue gabapentin 1200 mgPO QHS foranxiety andinsomnia   Continuesertraline 100 mg QAM / 50 mg PO QNoon   Continue desvenlafaxine 50 mgqDaily    Labs from 11/26/2018 reviewed with patient.    Labs ordered on 01/13/2019 still pending               Lab Results   Component Value Date    VDT 34 11/26/2018     Assessment and Plan:    1. Weight gain  May be multifactorial - is not eating a large quantity of food but admits to not exercising. Will refer to nutritionist.  Will also ask pt to f/u with Dr. NadesJulianne Handlernsider restarting on Orlistat or  possibly another agent. Also discussed that some of her weight gain may be 2/2 to SE from her psychotropic meds (Geodon, Abilify) - will review with psychiatrist.   - Referral to Dietician; Future  - Glycosylated Hgb(A1C), Blood Lavender; Future  - Glycosylated Hgb(A1C), Blood Lavender    2. Hypothyroidism, unspecified type  TFTs at goal, continue current regimen.   - TSH, Blood - See Instructions; Future  - Free Thyroxine, Blood - See Instructions; Future  - TSH, Blood - See Instructions  - Free Thyroxine, Blood - See Instructions    3. Schizoaffective disorder, bipolar type (CMS-HCC)  On extensive regimen as noted above, following with Psychiatry.  Will f/u with Psych to review her worsening mood sxs at night time, also weight gain.   - Hemogram Lavender; Future  - TSH, Blood - See Instructions; Future  - Free Thyroxine, Blood - See Instructions; Future  - Hemogram Lavender  - TSH, Blood - See Instructions  - Free Thyroxine,  Blood - See Instructions    4. Morbid obesity (CMS-HCC)  See above.  - Glycosylated Hgb(A1C), Blood Lavender; Future  - Glycosylated Hgb(A1C), Blood Lavender    5. Chest pain, unspecified type  Atypical, no further sxs, cardiology recommends no further workup for now.  Will monitor.     6. Mixed hyperlipidemia  Continue on current regimen of statin, but will repeat labs and see if statin dose can be optimized.   - Comprehensive Metabolic Panel - See Instructions; Future  - Lipid Panel Green Plasma Separator Tube; Future  - Comprehensive Metabolic Panel - See Instructions  - Lipid Panel Green Plasma Separator Tube    7. Anxiety disorder, unspecified type   See above.           Lab Results   Component Value Date    VDT 34 11/26/2018     Diabetes screen:  Lab Results   Component Value Date    A1C 5.3 11/26/2018     Thyroid:    Lab Results   Component Value Date    TSH 0.86 11/26/2018     No results found for: Stephannie Li, [place], BILIUA, [place], SGUA, BLOODUA, PHUA, PROTEINUA, UROBILUA, NITRITEUA, [place], WBCUA, RBCUA, EPITHCELLSUA, HYALINEUA, CRYSTALSUA, COMMENTSUA    Lab Results   Component Value Date    WBC 4.6 07/09/2018    RBC 4.64 12/12/2018    HGB 13.5 12/12/2018    HCT 40.6 12/12/2018    MCV 87.6 12/12/2018    MCHC 33.2 12/12/2018    RDW 13.9 12/12/2018    PLT 178 12/12/2018    MPV 11.7 07/09/2018     Lab Results   Component Value Date    BUN 18 11/26/2018    CREAT 0.81 11/26/2018    CL 105 11/26/2018    NA 141 11/26/2018    K 4.5 11/26/2018    [place] 9.7 11/26/2018    TBILI 1.0 11/26/2018    ALB 4.2 11/26/2018    TP 6.9 11/26/2018    AST 15 11/26/2018    ALK 101 11/26/2018    BICARB 30 11/26/2018    ALT 9 11/26/2018    GLU 90 11/26/2018     Lab Results   Component Value Date    TSH 0.86 11/26/2018     Lab Results   Component Value Date    CHOL 182 11/26/2018    HDL 80 11/26/2018    TRIG 66 11/26/2018     The 10-year ASCVD risk score Mikey Bussing DC Brooke Bonito., et  al., 2013) is: 10.8%    Values used to  calculate the score:      Age: 42 years      Sex: Female      Is Non-Hispanic African American: No      Diabetic: No      Tobacco smoker: No      Systolic Blood Pressure: 202 mmHg      Is BP treated: No      HDL Cholesterol: 80 mg/dL      Total Cholesterol: 182 mg/dL       Past Medical and Surgical History:  Past Medical History:   Diagnosis Date   . Bipolar affect, depressed (CMS-HCC)    . Chronic back pain    . Hypothyroidism    . Insomnia    . OAB (overactive bladder)    . OCD (obsessive compulsive disorder)    . Osteopenia    . Pure hypercholesterolemia 10/10/2018   . Schizophrenia (CMS-HCC)      Past Surgical History:   Procedure Laterality Date   . Biilat. Knee replacement     . Rt toe surgery     . TOTAL KNEE ARTHROPLASTY Bilateral        Immunization History:  Immunization History   Administered Date(s) Administered   . (Shingles) Herpes Zoster Vaccine (ZOSTAVAX) 04/22/2014   . COVID-19 [employer]) 03/08/2019   . Influenza Vaccine (High Dose) >=65 Years 11/04/2017   . Influenza Vaccine (Unspecified) 10/26/2015   . Pneumococcal 13 Vaccine (PREVNAR-13) 10/19/2014   . Pneumococcal 23 Vaccine (PNEUMOVAX-23) 10/17/2015   . Tdap 02/14/2016       Allergies:  No Known Allergies    Medications:  Current Outpatient Medications   Medication Sig Dispense Refill   . ammonium lactate (AMLACTIN) 12 % lotion Apply 1 Application topically 2 times daily. Rub in to affected area well for dry skin 1 bottle 11   . ARIPiprazole (ABILIFY) 30 MG tablet Take 1 tablet (30 mg) by mouth daily. 90 tablet 1   . aspirin 81 MG tablet Take 81 mg by mouth daily.     Marland Kitchen atorvastatin (LIPITOR) 10 MG tablet Take 1 tablet (10 mg) by mouth daily. 90 tablet 3   . BIOTIN PO Take 1 capsule by mouth daily.     . Calcium Carbonate-Vit D-Min (CALCIUM 1200 PO) Take 1 tablet by mouth daily.     . celecoxib (CELEBREX) 100 MG capsule Take 1 capsule (100 mg) by mouth 2 times daily. 60 capsule 1   . Cholecalciferol (VITAMIN D3) 50 MCG (2000 UT) capsule Take 1  capsule by mouth daily.     . Clobetasol Propionate (IMPOYZ) 0.025 % CREA Apply topically.     Marland Kitchen desvenlafaxine (PRISTIQ) 50 MG TB24 Take 1 tablet (50 mg) by mouth daily. 90 tablet 3   . econazole (SPECTAZOLE) 1 % cream Apply 1 Application topically 2 times daily. Use a small amount as directed 1 Tube 1   . fluPHENAZine (PROLIXIN) 10 MG tablet Take 1 tablet (10 mg) by mouth daily. 30 tablet 2   . gabapentin (NEURONTIN) 600 MG tablet Take 2 tablets (1,200 mg) by mouth at bedtime. 180 tablet 3   . levothyroxine (SYNTHROID) 137 MCG tablet TK 1 T PO D     . mirabegron (MYRBETRIQ) 50 MG ER tablet Take 50 mg by mouth daily. 90 tablet 3   . nystatin (MYCOSTATIN) 100000 UNIT/GM powder Apply 1 Application topically 3 times daily. Apply to affected area 15 g 0   .  orlistat (XENICAL) 120 MG capsule Take 1 capsule (120 mg) by mouth 3 times daily (with meals). 90 capsule 3   . oxybutynin (DITROPAN XL) 10 MG Controlled-Release tablet Take 1 tablet (10 mg) by mouth daily. 90 tablet 3   . sertraline (ZOLOFT) 50 MG tablet Take 2 tablets (100 mg) by mouth daily AND 1 tablet (50 mg) every evening. 90 tablet 1   . triamcinolone (KENALOG) 0.1 % ointment Use twice a day as needed for hand eczema 80 g 2   . trihexyphenidyl (ARTANE) 2 MG tablet Take 0.5 tablets (1 mg) by mouth 2 times daily. 90 tablet 1   . ziprasidone (GEODON) 80 MG capsule Take 1 capsule (80 mg) by mouth 2 times daily (with meals). 180 capsule 1     No current facility-administered medications for this visit.        Social History:  Social History     Socioeconomic History   . Marital status: Married     Spouse name: Not on file   . Number of children: Not on file   . Years of education: Not on file   . Highest education level: Not on file   Occupational History   . Not on file   Tobacco Use   . Smoking status: Former Smoker     Packs/day: 2.00     Years: 30.00     Pack years: 60.00   . Smokeless tobacco: Never Used   Substance and Sexual Activity   . Alcohol use: No      Frequency: Never   . Drug use: No   . Sexual activity: Never   Social Activities of Daily Living Present   . Not on file   Social History Narrative   . Not on file       Family History:  Family History   Problem Relation Name Age of Onset   . Other Mother          Wegener   . Cancer Brother          Leukemia   . Cancer Father          Leukemia   . Heart Disease Father     . Hypertension Father     . No Known Problems Sister     . Cancer Brother          Bladder Cancer          Objective:    No vitals signs and very limited, if any, physical examination done as discussion was not in person.    GEN: Well appearing, in NAD  PSYCH: normal mood, mood matches affect    Additional Information Reviewed:        Assessment and Plan:             All the above issues were fully discussed with patient during our telephone conversation and patient is aware of all of these issues as outlined above.         Charlotte Crumb, MD, MPH    Due to COVID-19 pandemic and a federally declared state of public health emergency, this service is being conducted via telephone.  Patient consented to proceeding with telemedicine visit today. Total time spent was {DURATION OF AOZH:08657}. The telemedicine visit was conducted with {MODE:26040}

## 2019-03-11 ENCOUNTER — Telehealth: Payer: Self-pay | Admitting: Internal Medicine

## 2019-03-11 ENCOUNTER — Ambulatory Visit: Payer: Medicare (Managed Care) | Attending: Internal Medicine | Admitting: Internal Medicine

## 2019-03-11 ENCOUNTER — Ambulatory Visit: Payer: Medicare (Managed Care) | Admitting: Internal Medicine

## 2019-03-11 DIAGNOSIS — R32 Unspecified urinary incontinence: Secondary | ICD-10-CM | POA: Insufficient documentation

## 2019-03-11 DIAGNOSIS — I359 Nonrheumatic aortic valve disorder, unspecified: Secondary | ICD-10-CM

## 2019-03-11 DIAGNOSIS — R2243 Localized swelling, mass and lump, lower limb, bilateral: Secondary | ICD-10-CM

## 2019-03-11 DIAGNOSIS — E039 Hypothyroidism, unspecified: Secondary | ICD-10-CM | POA: Insufficient documentation

## 2019-03-11 NOTE — Telephone Encounter (Signed)
Dr. [name], please advice.  Please let me know if I can scheduled her at 11:20am slot on that day. Julie Monroe

## 2019-03-11 NOTE — Progress Notes (Signed)
TELEMEDICINE VISIT       The following medical visit occurred in the form of a telemedicine visit instead of an in person face-to-face visit. Consent for telemedicine visit obtained from patient prior to the start of the visit.     Patient Questionnaire responses confirmed prior to onset of this visit:  Are you in state of [place]?: Yes  Do you consent to proceed with the Video Visit today?: Yes    The following items were discussed in detail during this visit:    History of Present Illness:  Date of Evaluation: March 11, 2019    HISTORY OF PRESENT ILLNESS:   Julie Monroe is a 74 year old year old female who presents today for follow up. LOV on 01/13/2019.    # Sacroiliac dysfunction - One week ago (03/04/19), had an SI joint injection by Dr. Meda Coffee and that did not work. Now has a large bruise at the site, no hematoma.  Was scheduled to f/u with Dr. Meda Coffee later this week, may cancel.     # COVID vaccine few days ago [employer]), on 03/08/19, did well with vaccine    # Would like a referral to a specialist, would like to see them for lumps on legs. Lumps on ankles bilat, occ will have one on top of foot.  Soft, but large, swelling but no warmth, no pain, no drainage.     # Atypical chest pain - did see cardiology Dr. Linward Headland on 12/16/2018 who did not think any further eval or work up was needed for this. Follow up on 03/18/2019. Needs new referral since insurance changed.    # Mild aortic valve stenosis - plan is to f/u on this with repeat 2d echo 1 year from last, has been ordered but not yet completed.     # HLD - chol panel from 11/26/2018 shows:  TC 182, Tg 62, HDL 79, LDL 146. On Lipitor 63m daily.  Repeat labs pending.     # Hypothyroid - on Synthroid 1343m; TSH 0.86, FT4 1.3 previous, updated labs pending.     # Morbid obesity - is following with Dr. NaJulianne Handler Needs new referral.    # Depression - seeing psychiatrist, needs referral for this. On Geodon, Zoloft, Fluphenazine, Pristiq, Abilify  and Artane.  Also takes Gabapentin for sleep.  Depression is not severe but is persistent.             Lab Results   Component Value Date    VDT 34 11/26/2018     Diabetes screen:  Lab Results   Component Value Date    A1C 5.3 11/26/2018     Thyroid:    Lab Results   Component Value Date    TSH 0.86 11/26/2018     No results found for: [place], [place], [place]UA, BLOODUA, PHUA, PROTEINUA, UROBILUA, NITRITEUA, LEU[place]STUA, WBCUA, RBCUA, EPITHCELLSUA, HYALINEUA, CRYSTALSUA, COMMENTSUA    Lab Results   Component Value Date    WBC 4.6 07/09/2018    RBC 4.64 12/12/2018    HGB 13.5 12/12/2018    HCT 40.6 12/12/2018    MCV 87.6 12/12/2018    MCHC 33.2 12/12/2018    RDW 13.9 12/12/2018    PLT 178 12/12/2018    MPV 11.7 07/09/2018     Lab Results   Component Value Date    BUN 18 11/26/2018    CREAT 0.81 11/26/2018    CL 105 11/26/2018    NA 141 11/26/2018  K 4.5 11/26/2018    [place] 9.7 11/26/2018    TBILI 1.0 11/26/2018    ALB 4.2 11/26/2018    TP 6.9 11/26/2018    AST 15 11/26/2018    ALK 101 11/26/2018    BICARB 30 11/26/2018    ALT 9 11/26/2018    GLU 90 11/26/2018     Lab Results   Component Value Date    TSH 0.86 11/26/2018     Lab Results   Component Value Date    CHOL 182 11/26/2018    HDL 80 11/26/2018    TRIG 66 11/26/2018     The 10-year ASCVD risk score Mikey Bussing DC Jr., et al., 2013) is: 10.8%    Values used to calculate the score:      Age: 72 years      Sex: Female      Is Non-Hispanic African American: No      Diabetic: No      Tobacco smoker: No      Systolic Blood Pressure: 672 mmHg      Is BP treated: No      HDL Cholesterol: 80 mg/dL      Total Cholesterol: 182 mg/dL       Past Medical and Surgical History:  Past Medical History:   Diagnosis Date   . Bipolar affect, depressed (CMS-HCC)    . Chronic back pain    . Hypothyroidism    . Insomnia    . OAB (overactive bladder)    . OCD (obsessive compulsive disorder)    . Osteopenia    . Pure hypercholesterolemia 10/10/2018   . Schizophrenia  (CMS-HCC)      Past Surgical History:   Procedure Laterality Date   . Biilat. Knee replacement     . Rt toe surgery     . TOTAL KNEE ARTHROPLASTY Bilateral        Immunization History:  Immunization History   Administered Date(s) Administered   . (Shingles) Herpes Zoster Vaccine (ZOSTAVAX) 04/22/2014   . COVID-19 [employer]) 03/08/2019   . Influenza Vaccine (High Dose) >=65 Years 11/04/2017   . Influenza Vaccine (Unspecified) 10/26/2015   . Pneumococcal 13 Vaccine (PREVNAR-13) 10/19/2014   . Pneumococcal 23 Vaccine (PNEUMOVAX-23) 10/17/2015   . Tdap 02/14/2016       Allergies:  No Known Allergies    Medications:  Current Outpatient Medications   Medication Sig Dispense Refill   . ammonium lactate (AMLACTIN) 12 % lotion Apply 1 Application topically 2 times daily. Rub in to affected area well for dry skin 1 bottle 11   . ARIPiprazole (ABILIFY) 30 MG tablet Take 1 tablet (30 mg) by mouth daily. 90 tablet 1   . aspirin 81 MG tablet Take 81 mg by mouth daily.     Marland Kitchen atorvastatin (LIPITOR) 10 MG tablet Take 1 tablet (10 mg) by mouth daily. 90 tablet 3   . BIOTIN PO Take 1 capsule by mouth daily.     . Calcium Carbonate-Vit D-Min (CALCIUM 1200 PO) Take 1 tablet by mouth daily.     . celecoxib (CELEBREX) 100 MG capsule Take 1 capsule (100 mg) by mouth 2 times daily. 60 capsule 1   . Cholecalciferol (VITAMIN D3) 50 MCG (2000 UT) capsule Take 1 capsule by mouth daily.     . Clobetasol Propionate (IMPOYZ) 0.025 % CREA Apply topically.     Marland Kitchen desvenlafaxine (PRISTIQ) 50 MG TB24 Take 1 tablet (50 mg) by mouth daily. 90 tablet 3   . econazole (  SPECTAZOLE) 1 % cream Apply 1 Application topically 2 times daily. Use a small amount as directed 1 Tube 1   . fluPHENAZine (PROLIXIN) 10 MG tablet Take 1 tablet (10 mg) by mouth daily. 30 tablet 2   . gabapentin (NEURONTIN) 600 MG tablet Take 2 tablets (1,200 mg) by mouth at bedtime. 180 tablet 3   . levothyroxine (SYNTHROID) 137 MCG tablet TK 1 T PO D     . mirabegron (MYRBETRIQ) 50 MG ER  tablet Take 50 mg by mouth daily. 90 tablet 3   . nystatin (MYCOSTATIN) 100000 UNIT/GM powder Apply 1 Application topically 3 times daily. Apply to affected area 15 g 0   . orlistat (XENICAL) 120 MG capsule Take 1 capsule (120 mg) by mouth 3 times daily (with meals). 90 capsule 3   . oxybutynin (DITROPAN XL) 10 MG Controlled-Release tablet Take 1 tablet (10 mg) by mouth daily. 90 tablet 3   . sertraline (ZOLOFT) 50 MG tablet Take 2 tablets (100 mg) by mouth daily AND 1 tablet (50 mg) every evening. 90 tablet 1   . triamcinolone (KENALOG) 0.1 % ointment Use twice a day as needed for hand eczema 80 g 2   . trihexyphenidyl (ARTANE) 2 MG tablet Take 0.5 tablets (1 mg) by mouth 2 times daily. 90 tablet 1   . ziprasidone (GEODON) 80 MG capsule Take 1 capsule (80 mg) by mouth 2 times daily (with meals). 180 capsule 1     No current facility-administered medications for this visit.        Social History:  Social History     Socioeconomic History   . Marital status: Married     Spouse name: Not on file   . Number of children: Not on file   . Years of education: Not on file   . Highest education level: Not on file   Occupational History   . Not on file   Tobacco Use   . Smoking status: Former Smoker     Packs/day: 2.00     Years: 30.00     Pack years: 60.00   . Smokeless tobacco: Never Used   Substance and Sexual Activity   . Alcohol use: No     Frequency: Never   . Drug use: No   . Sexual activity: Never   Social Activities of Daily Living Present   . Not on file   Social History Narrative   . Not on file       Family History:  Family History   Problem Relation Name Age of Onset   . Other Mother          Wegener   . Cancer Brother          Leukemia   . Cancer Father          Leukemia   . Heart Disease Father     . Hypertension Father     . No Known Problems Sister     . Cancer Brother          Bladder Cancer          Objective:    No vitals signs and very limited, if any, physical examination done as discussion was not in  person.    Additional Information Reviewed:    Notes from specialists reviewed.      Assessment and Plan:    1. Urinary incontinence, unspecified type  Pt is known to Dr. Alva Garnet, new referral needed.   - Consult/Referral  to Urology    2. Mass of both ankles  Pt would like to follow up with Dr. Rigoberto Noel for this.   - Consult/Referral to Orthopedics    3. Aortic valve disorder  Mild on last 2d echo and pt is asymptomatic, but should get repeat 2d echo.  Order in place, will f/u with cards.   - Consult/Referral to Cardiology Clinic    4. Morbid obesity (CMS-HCC)  Working with Dr. Julianne Handler. Referral re-placed.   - Consult/Referral to Bariatric Medicine    5. Hypothyroid   Continue on current regimen, TFTs pending.        All the above issues were fully discussed with patient during our telephone conversation and patient is aware of all of these issues as outlined above.         Charlotte Crumb, MD, MPH    Due to COVID-19 pandemic and a federally declared state of public health emergency, this service is being conducted via telephone.  Patient consented to proceeding with telemedicine visit today. Total time spent was 21+ minutes. The telemedicine visit was conducted with Audio Only

## 2019-03-11 NOTE — Telephone Encounter (Signed)
Rosie, Please assist. Thank you.

## 2019-03-11 NOTE — Telephone Encounter (Signed)
Patient is requesting to have a follow up apt on 06/10/19 around 11am. Patient stated she would like to come in around the same time as her husband on 06/10/19. Please advise  Thank you.

## 2019-03-11 NOTE — Telephone Encounter (Signed)
No, don't know my teaching schedule that day so would like to avoid overbook.  There are two 20 min slots open on Wed, 4/28.  Please move them both to that date.     Thank yo.

## 2019-03-12 ENCOUNTER — Other Ambulatory Visit: Payer: Medicare (Managed Care)

## 2019-03-12 ENCOUNTER — Telehealth: Payer: Self-pay | Admitting: Internal Medicine

## 2019-03-12 NOTE — Telephone Encounter (Signed)
Rosie, Please assist. Since this is quite a ways in the future, you can mail them their lab orders to just take with them. Thank you.

## 2019-03-12 NOTE — Telephone Encounter (Signed)
03/12/19 Spoke to patient husband, will mail lab  Orders to patient. ranranda  MA

## 2019-03-12 NOTE — Telephone Encounter (Signed)
Patient is requesting for lab order for apt on 06/25/19 to be sent to [facility] [address]  [place] [address]  Ph: [phone]    Patient would like lab order to be sent 3 weeks before apt on 06/25/19. Please advise thank you

## 2019-03-13 ENCOUNTER — Ambulatory Visit: Payer: Medicare (Managed Care) | Admitting: Internal Medicine

## 2019-03-14 ENCOUNTER — Ambulatory Visit: Payer: Medicare (Managed Care) | Admitting: Pain Medicine

## 2019-03-18 ENCOUNTER — Encounter: Payer: Self-pay | Admitting: Psychiatry

## 2019-03-18 DIAGNOSIS — F419 Anxiety disorder, unspecified: Secondary | ICD-10-CM

## 2019-03-18 DIAGNOSIS — F429 Obsessive-compulsive disorder, unspecified: Secondary | ICD-10-CM

## 2019-03-18 DIAGNOSIS — F250 Schizoaffective disorder, bipolar type: Secondary | ICD-10-CM

## 2019-03-18 MED ORDER — ZIPRASIDONE HCL 80 MG OR CAPS
80.0000 mg | ORAL_CAPSULE | Freq: Two times a day (BID) | ORAL | 1 refills | Status: DC
Start: 2019-03-18 — End: 2019-07-15

## 2019-03-18 MED ORDER — SERTRALINE HCL 50 MG OR TABS
ORAL_TABLET | ORAL | 2 refills | Status: DC
Start: 2019-03-18 — End: 2019-04-25

## 2019-03-18 MED ORDER — TRIHEXYPHENIDYL HCL 2 MG OR TABS
1.0000 mg | ORAL_TABLET | Freq: Three times a day (TID) | ORAL | 1 refills | Status: DC
Start: 2019-03-18 — End: 2019-06-09

## 2019-03-18 MED ORDER — DESVENLAFAXINE SUCCINATE 50 MG OR TB24
50.0000 mg | ORAL_TABLET | Freq: Every day | ORAL | 1 refills | Status: DC
Start: 2019-03-18 — End: 2019-07-15

## 2019-03-18 NOTE — Telephone Encounter (Signed)
From: Lillard Anes  To: Elayne Guerin, MD  Sent: 03/18/2019 2:04 PM PST  Subject: 2-Procedural Question    Hi Dr Willaim Bane, how are you? Re [name] question, I have no more refills on 4 meds with special circumstances. They are Geodon 80 mg 2 per day -- ( still being filled at 1 per day so running out of them early.) Next, Artane 2mg  -- now taking 2mg  but was taking 3 mg so running out early. Next, Zoloft 50 mg. 1 per day -- I also take 100 mg 1 per day but I don't need refill on this dose. Next, Pristiq 50 mg 1 per day. We now are back at [facility] [address] in . Thank you and welcome back! Julie Monroe

## 2019-03-21 ENCOUNTER — Encounter: Payer: Self-pay | Admitting: Critical Care

## 2019-03-24 ENCOUNTER — Ambulatory Visit: Payer: Self-pay | Admitting: Cardiovascular Disease

## 2019-03-25 ENCOUNTER — Ambulatory Visit: Payer: Medicare (Managed Care) | Admitting: Internal Medicine

## 2019-03-29 ENCOUNTER — Ambulatory Visit: Payer: Medicare (Managed Care) | Attending: Critical Care

## 2019-03-29 DIAGNOSIS — Z23 Encounter for immunization: Secondary | ICD-10-CM

## 2019-03-31 ENCOUNTER — Encounter: Payer: Self-pay | Admitting: Pain Medicine

## 2019-04-03 ENCOUNTER — Telehealth: Payer: Self-pay

## 2019-04-03 NOTE — Telephone Encounter (Signed)
Pt called requesting to s/w Dr Delton See. I offered appt or to take a message as Dr is not currently in clinic; pt declined both and simply said that she expects a call back and that Dr Delton See knows why. Pt would not give any further detailed even after I assured her I could help.     I reviewed details of recent MyChart message and again offered a follow up appt as the SIJ inj was not helpful for her pain. I advised that the best way to discuss plan of care is at a follow up appt. Pt declined.    Note: pt had inj done on 03/04/19. Had follow up scheduled on 03/14/19 of which she cancelled on 03/12/19. Pt sent MyChart message on 03/31/19 which was responded to by RN on 04/02/19 with no response from pt. Pt call clinic on 04/03/19.    Will conference with Dr Delton See

## 2019-04-04 ENCOUNTER — Encounter: Payer: Self-pay | Admitting: Psychiatry

## 2019-04-04 NOTE — Telephone Encounter (Signed)
S/w Dr Delton See who agreed to return pt's call as requested. Dr Lindaann Slough schedule does not permit this until Tuesday 04/08/19. LVM for pt with this notice and sent her a MyChart message. No further action needed at this time.

## 2019-04-08 ENCOUNTER — Ambulatory Visit (INDEPENDENT_AMBULATORY_CARE_PROVIDER_SITE_OTHER): Payer: Medicare (Managed Care) | Admitting: Psychiatry

## 2019-04-08 DIAGNOSIS — R251 Tremor, unspecified: Secondary | ICD-10-CM

## 2019-04-08 DIAGNOSIS — F250 Schizoaffective disorder, bipolar type: Secondary | ICD-10-CM

## 2019-04-08 DIAGNOSIS — G259 Extrapyramidal and movement disorder, unspecified: Secondary | ICD-10-CM

## 2019-04-08 DIAGNOSIS — F419 Anxiety disorder, unspecified: Secondary | ICD-10-CM

## 2019-04-08 DIAGNOSIS — F429 Obsessive-compulsive disorder, unspecified: Secondary | ICD-10-CM

## 2019-04-08 DIAGNOSIS — G4700 Insomnia, unspecified: Secondary | ICD-10-CM

## 2019-04-08 MED ORDER — SERTRALINE HCL 100 MG OR TABS
100.0000 mg | ORAL_TABLET | Freq: Every day | ORAL | Status: DC
Start: 2019-01-12 — End: 2019-09-16

## 2019-04-08 MED ORDER — FLUPHENAZINE HCL 10 MG OR TABS
10.0000 mg | ORAL_TABLET | Freq: Every day | ORAL | 1 refills | Status: DC
Start: 2019-04-08 — End: 2019-04-11

## 2019-04-08 NOTE — Progress Notes (Signed)
Outpatient Psychiatry Progress Note      Chief Complaint:   Regularly scheduled follow-up appointment    Identifying Information:  Julie Monroe is a 73 year old female who presents today for a follow-up appointment.     Interval History:  Julie Monroe a53 year old F with a past psychiatric history of schizoaffective d/o, bipolar type, OCD and unspecified insomnia who presents for follow-up. Pt reports mood, "OK," and denies suicidal ideation, intent and plan. Pt denies associated depressive symptoms incl amotivation, anhedonia, decreased interest, decreased concentration and hopelessness. Pt reports stable anxiety. Pt reports intervally worsened paranoid ideation (and subsequent anxiety) re "a stalker"and thought transmission since last encounter, and subsequently uptitrated fluphenazine and trihexyphenidyl as below. Pt reports above symptoms improved as a result. Pt denies auditory and visual hallucinations. Pt denies current or recent decreased need for sleep or increased energy > 4 d, hyperverbality, racing thoughts, grandiosity and goal-directed bevavior c/w manic or hypomanic episode at this encounter. Pt reports regular sleep schedule. Pt reports compliance with psychotropic medications as directed, and denies AEs. Pt denies change in upper extremity mixed tremor.    Review of Systems:  Denies fever, chills, chest pain, dyspnea, dizziness, syncope, recent falls, headaches, nausea, and vomiting.      Current Outpatient Medications:   .  ammonium lactate (AMLACTIN) 12 % lotion, Apply 1 Application topically 2 times daily. Rub in to affected area well for dry skin, Disp: 1 bottle, Rfl: 11  .  ARIPiprazole (ABILIFY) 30 MG tablet, Take 1 tablet (30 mg) by mouth daily., Disp: 90 tablet, Rfl: 1  .  aspirin 81 MG tablet, Take 81 mg by mouth daily., Disp: , Rfl:   .  atorvastatin (LIPITOR) 10 MG tablet, Take 1 tablet (10 mg) by mouth daily., Disp: 90 tablet, Rfl: 3  .  BIOTIN PO, Take 1 capsule by mouth  daily., Disp: , Rfl:   .  Calcium Carbonate-Vit D-Min (CALCIUM 1200 PO), Take 1 tablet by mouth daily., Disp: , Rfl:   .  celecoxib (CELEBREX) 100 MG capsule, Take 1 capsule (100 mg) by mouth 2 times daily., Disp: 60 capsule, Rfl: 1  .  Cholecalciferol (VITAMIN D3) 50 MCG (2000 UT) capsule, Take 1 capsule by mouth daily., Disp: , Rfl:   .  Clobetasol Propionate (IMPOYZ) 0.025 % CREA, Apply topically., Disp: , Rfl:   .  desvenlafaxine (PRISTIQ) 50 MG TB24, Take 1 tablet (50 mg) by mouth daily., Disp: 90 tablet, Rfl: 1  .  econazole (SPECTAZOLE) 1 % cream, Apply 1 Application topically 2 times daily. Use a small amount as directed, Disp: 1 Tube, Rfl: 1  .  gabapentin (NEURONTIN) 600 MG tablet, Take 2 tablets (1,200 mg) by mouth at bedtime., Disp: 180 tablet, Rfl: 3  .  levothyroxine (SYNTHROID) 137 MCG tablet, TK 1 T PO D, Disp: , Rfl:   .  mirabegron (MYRBETRIQ) 50 MG ER tablet, Take 50 mg by mouth daily., Disp: 90 tablet, Rfl: 3  .  nystatin (MYCOSTATIN) 100000 UNIT/GM powder, Apply 1 Application topically 3 times daily. Apply to affected area, Disp: 15 g, Rfl: 0  .  orlistat (XENICAL) 120 MG capsule, Take 1 capsule (120 mg) by mouth 3 times daily (with meals)., Disp: 90 capsule, Rfl: 3  .  oxybutynin (DITROPAN XL) 10 MG Controlled-Release tablet, Take 1 tablet (10 mg) by mouth daily., Disp: 90 tablet, Rfl: 3  .  sertraline (ZOLOFT) 50 MG tablet, Take 2 tablets (100 mg) by mouth daily AND 1  tablet (50 mg) every evening., Disp: 90 tablet, Rfl: 2  .  triamcinolone (KENALOG) 0.1 % ointment, Use twice a day as needed for hand eczema, Disp: 80 g, Rfl: 2  .  trihexyphenidyl (ARTANE) 2 MG tablet, Take 0.5 tablets (1 mg) by mouth 3 times daily., Disp: 135 tablet, Rfl: 1  .  ziprasidone (GEODON) 80 MG capsule, Take 1 capsule (80 mg) by mouth 2 times daily (with meals)., Disp: 180 capsule, Rfl: 1    Vital Signs:  LMP  (LMP Unknown)     Mental Status Exam:    Appearance: Fair hygiene, grooming.  Behavior: Cooperative.  Calm.  Gait: No gait abnormality.  Muscle Strength/Tone:Mild to moderate bilateral upper extremity mixed tremor, unchanged  Speech: Normal rate, tone and volume  Mood: "OK"  Affect:Mildly flattened  Thought Process: Linear, goal-directed   - Associations: Intact  Thought Content: Denies suicidal or homicidal ideation, intent and plan. Denies auditory or visual hallucinations, but appears distractible and internally preoccupied. Endorses thought transmission.  Attention Span and Concentration: Mildly distracted in conversation  Language: Able to name objects  Fund of Knowledge: Average  Memory: Both recent and remote memories are intact in context of conversation  Orientation: Intact to person, place and situation  Insight/Judgment:Improved    LABS/ OTHER STUDIES    CHEM 7  Lab Results   Component Value Date/Time    NA 141 11/26/2018 05:12 AM    K 4.5 11/26/2018 05:12 AM    CL 105 11/26/2018 05:12 AM    BUN 18 11/26/2018 05:12 AM    GLU 90 11/26/2018 05:12 AM    [place] 9.7 11/26/2018 05:12 AM     LFT's  Lab Results   Component Value Date/Time    AST 15 11/26/2018 05:12 AM    ALT 9 11/26/2018 05:12 AM    ALB 4.2 11/26/2018 05:12 AM    TP 6.9 11/26/2018 05:12 AM     CBC  Lab Results   Component Value Date/Time    WBC 4.6 07/09/2018 07:41 AM    HGB 13.5 12/12/2018 11:47 AM    HGB 13.5 07/09/2018 07:41 AM    HCT 40.6 12/12/2018 11:47 AM    HCT 40.4 07/09/2018 07:41 AM    PLT 178 12/12/2018 11:47 AM    PLT 182 07/09/2018 07:41 AM     LIPID PANEL  Lab Results   Component Value Date/Time    CHOL 182 11/26/2018 05:12 AM    HDL 80 11/26/2018 05:12 AM    TRIG 66 11/26/2018 05:12 AM     HGBA1c  No components found for: A1C;3  Miscellaneous   TSH   Date Value Ref Range Status   11/26/2018 0.86 0.40 - 4.50 mIU/L Final        Scales:   [place] PHQ9 DEPRESSION QUESTIONNAIRE 10/10/2018 03/04/2019   Interest 0 0   Depressed 1 0   Sleep -- --   Energy -- --   Appetite -- --   Failure -- --   Concentration -- --   Movement -- --    Suicide -- --   Summary(Manual) -- --   Summary(Calculated) -- --   Functional -- --   Some recent data might be hidden     No flowsheet data found.    AIMS (02/07/18):  Facial & Oral Movements: 6  Extremity movements: 3  Trunk Movement: 0  Global Judgment: 1  Total: 10    AIMS (02/20/17):  Facial & Oral Movements: 0  Extremity movements:  2  Trunk Movement: 0  Global Judgment: 1  Total: 3    Assessment:   Julie Monroe a25 year old F with a past psychiatric history of schizoaffective d/o, bipolar type, OCD and unspecified insomnia who presents for follow-up.Pt reports stable mood and anxiety, and intervally improved psychotic s/s following self-uptitration (well tolerated) of fluphenazine and trihexyphenidyl. Plan to continue psychotropic medications at current dosage. May consider clozapine if psychotic s/s persist.    Shared decision made to downtitrate quetiapine and monitor rebound insomnia.     She is assessed to be at alow acute risk of suicide.   Clinical Global Impression - Severity:4= Moderately ill  :Suggested Guidelines= Overt symptomatology causing noticeable, but modest, functional impairment. .    Diagnosis/Diagnoses:     ICD-10-CM ICD-9-CM   1. Schizoaffective disorder, bipolar type (CMS-HCC)  F25.0 295.70   2. Obsessive-compulsive disorder, unspecified type  F42.9 300.3   3. 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 Insomnia, unspecified type  G47.00 780.52      Plan:  Medication Management: Risks, benefits, and alternatives of medications were discussed today with Julie Monroe and she consents to regimen.  #Psychosis:   Increase to fluphenazine 10 mg PO qDaily, from 5 mg PO qDaily        Consider downtitrating to 5 mg PO qDaily if psychiatric s/s stable for >4 w   Continue aripiprazole 30 mgPOqDaily   Continue ziprasidone 80 mg PO BID  #EPS   Increase totrihedyphenidyl(Artane)1 mg POTIDfor EPS  # Anxiety, OCD   Continue gabapentin 1200  mgPO QHS foranxiety and insomnia   Continuesertraline 100 mg QAM / 50 mg PO QNoon   Continue desvenlafaxine 50 mgqDaily    In-session psychotherapy:  I spent 22 minutes providing psychotherapy for this encounter.  Treatment modality(ies) employed: Psychoeducation and supportive  Progress to date: Improving  -- Psychoeducation self-titration of medications and supportive psychotherapy  Treatment plan: Continue psychoeducation and supportive  Level of Care: Pt currently does not meet criteria for inpatient psychiatric hospitalization.    Note Author:  Elayne Guerin, MD  Resident/Fellow Physician

## 2019-04-09 ENCOUNTER — Telehealth: Payer: Self-pay | Admitting: Pain Medicine

## 2019-04-09 DIAGNOSIS — M533 Sacrococcygeal disorders, not elsewhere classified: Secondary | ICD-10-CM

## 2019-04-09 DIAGNOSIS — G8929 Other chronic pain: Secondary | ICD-10-CM

## 2019-04-09 NOTE — Telephone Encounter (Signed)
PT is scheduled for SI joint procedure on 04/16/19 but there is no order in Epic and previous clinical notes do not support appt. Please advise. Thank you.

## 2019-04-10 NOTE — Telephone Encounter (Signed)
Left message for patient to call back. Patient needs follow up appt.Patient was suppose to follow up two weeks after SI injection (03/04/19).

## 2019-04-11 MED ORDER — FLUPHENAZINE HCL 10 MG OR TABS
10.0000 mg | ORAL_TABLET | Freq: Every day | ORAL | 1 refills | Status: DC
Start: 2019-04-11 — End: 2019-06-09

## 2019-04-14 NOTE — Telephone Encounter (Signed)
Pt has conferenced with Dr Delton See at her husband's appt. POC is to repeat SIJ inj but pt has cancelled this appt with the front desk and declined offer to r/s. Will review with Dr Delton See.     Routing to fellow for SIJ order so we can process with insurance and be prepared.

## 2019-04-15 NOTE — Telephone Encounter (Signed)
Order placed for bilateral SIJ injections - diagnostic with approximately 20mg  kenalog each side.  Goal of this procedure is to assess if pt will respond to SIJ RFA.  Spoke with Dr. regarding plan.    Delton See, MD  PGY 5  Chronic Pain Fellow, Department of Anesthesiology    Diagnoses and all orders for this visit:    Sacroiliac joint dysfunction of both sides  -     Sacroiliac Joint Injection with Fluoroscopy Guidance; Future    Chronic sacroiliac joint pain  -     Sacroiliac Joint Injection with Fluoroscopy Guidance; Future

## 2019-04-16 ENCOUNTER — Ambulatory Visit: Payer: Medicare (Managed Care) | Admitting: Pain Medicine

## 2019-04-17 ENCOUNTER — Encounter: Payer: Self-pay | Admitting: Psychiatry

## 2019-04-17 DIAGNOSIS — F419 Anxiety disorder, unspecified: Secondary | ICD-10-CM

## 2019-04-17 DIAGNOSIS — F250 Schizoaffective disorder, bipolar type: Secondary | ICD-10-CM

## 2019-04-21 ENCOUNTER — Telehealth: Payer: Medicare (Managed Care) | Admitting: Urology

## 2019-04-21 ENCOUNTER — Encounter: Payer: Self-pay | Admitting: Urology

## 2019-04-21 ENCOUNTER — Ambulatory Visit: Payer: Medicare (Managed Care) | Admitting: Urology

## 2019-04-21 DIAGNOSIS — N3941 Urge incontinence: Secondary | ICD-10-CM

## 2019-04-21 DIAGNOSIS — N3281 Overactive bladder: Secondary | ICD-10-CM

## 2019-04-21 NOTE — Progress Notes (Signed)
Due to COVID-19 pandemic and a federally declared state of public health emergency, this telemedicine visit was conducted Audio Only. Total time spent was 11-20 minutes.     Patient confirmed location in [place] and consented to telemedicine visit      Urology Follow Up Note    HISTORY OF PRESENT ILLNESS:  Julie Monroe is a 74 year old-year-old woman here for follow up. She has a history ofurgency urinary incontinence. Cystoscopy on12/16/19demonstrated no lesions. She has been treated withoxybutyninand with PTNS x 4 sessions.She was doing well for some time after the PTNS, but she stopped the treatments because she did not feel it was helping. Then her symptoms started to worsen again with urgency and urgency incontinence. She did not wish to proceed with third line therapy so we started dual medical therapy with myrbetriq and oxybutynin.    Today she reports she is taking oxybutynin without myrbetriq. It is working ok. She cannot tolerate the myrbetriq due to sensation of inability to void. Today she notices urine is darker yellow than usual, denies hematuria. She has little incontinence. Daytime frequency is every 2 hours, with nocturia x 0.         Current Outpatient Medications:   .  ammonium lactate (AMLACTIN) 12 % lotion, Apply 1 Application topically 2 times daily. Rub in to affected area well for dry skin, Disp: 1 bottle, Rfl: 11  .  ARIPiprazole (ABILIFY) 30 MG tablet, Take 1 tablet (30 mg) by mouth daily., Disp: 90 tablet, Rfl: 1  .  aspirin 81 MG tablet, Take 81 mg by mouth daily., Disp: , Rfl:   .  atorvastatin (LIPITOR) 10 MG tablet, Take 1 tablet (10 mg) by mouth daily., Disp: 90 tablet, Rfl: 3  .  BIOTIN PO, Take 1 capsule by mouth daily., Disp: , Rfl:   .  Calcium Carbonate-Vit D-Min (CALCIUM 1200 PO), Take 1 tablet by mouth daily., Disp: , Rfl:   .  celecoxib (CELEBREX) 100 MG capsule, Take 1 capsule (100 mg) by mouth 2 times daily., Disp: 60 capsule, Rfl: 1  .  Cholecalciferol (VITAMIN D3) 50 MCG  (2000 UT) capsule, Take 1 capsule by mouth daily., Disp: , Rfl:   .  Clobetasol Propionate (IMPOYZ) 0.025 % CREA, Apply topically., Disp: , Rfl:   .  desvenlafaxine (PRISTIQ) 50 MG TB24, Take 1 tablet (50 mg) by mouth daily., Disp: 90 tablet, Rfl: 1  .  econazole (SPECTAZOLE) 1 % cream, Apply 1 Application topically 2 times daily. Use a small amount as directed, Disp: 1 Tube, Rfl: 1  .  fluPHENAZine (PROLIXIN) 10 MG tablet, Take 1 tablet (10 mg) by mouth daily., Disp: 30 tablet, Rfl: 1  .  gabapentin (NEURONTIN) 600 MG tablet, Take 2 tablets (1,200 mg) by mouth at bedtime., Disp: 180 tablet, Rfl: 3  .  levothyroxine (SYNTHROID) 137 MCG tablet, TK 1 T PO D, Disp: , Rfl:   .  mirabegron (MYRBETRIQ) 50 MG ER tablet, Take 50 mg by mouth daily., Disp: 90 tablet, Rfl: 3  .  nystatin (MYCOSTATIN) 100000 UNIT/GM powder, Apply 1 Application topically 3 times daily. Apply to affected area, Disp: 15 g, Rfl: 0  .  orlistat (XENICAL) 120 MG capsule, Take 1 capsule (120 mg) by mouth 3 times daily (with meals)., Disp: 90 capsule, Rfl: 3  .  oxybutynin (DITROPAN XL) 10 MG Controlled-Release tablet, Take 1 tablet (10 mg) by mouth daily., Disp: 90 tablet, Rfl: 3  .  sertraline (ZOLOFT) 100 MG tablet, Take  100 mg by mouth daily., Disp: , Rfl:   .  sertraline (ZOLOFT) 50 MG tablet, Take 2 tablets (100 mg) by mouth daily AND 1 tablet (50 mg) every evening., Disp: 90 tablet, Rfl: 2  .  triamcinolone (KENALOG) 0.1 % ointment, Use twice a day as needed for hand eczema, Disp: 80 g, Rfl: 2  .  trihexyphenidyl (ARTANE) 2 MG tablet, Take 0.5 tablets (1 mg) by mouth 3 times daily., Disp: 135 tablet, Rfl: 1  .  ziprasidone (GEODON) 80 MG capsule, Take 1 capsule (80 mg) by mouth 2 times daily (with meals)., Disp: 180 capsule, Rfl: 1    Past Medical History:   Diagnosis Date   . Bipolar affect, depressed (CMS-HCC)    . Chronic back pain    . Hypothyroidism    . Insomnia    . OAB (overactive bladder)    . OCD (obsessive compulsive disorder)    .  Osteopenia    . Pure hypercholesterolemia 10/10/2018   . Schizophrenia (CMS-HCC)        No Known Allergies      Diagnostic Data:  Labs:  Cr 0.7 11/2018    ASSESSMENT:  Julie Monroe is a 74 year old woman with urgency urinary incontinence due to overactive bladder.      PLAN:  She is doing well on oxybutynin and will continue with this. Will stop myrbetriq. Follow up with me in one year. Sooner if needed.

## 2019-04-22 ENCOUNTER — Encounter: Payer: Self-pay | Admitting: Psychiatry

## 2019-04-23 ENCOUNTER — Ambulatory Visit: Payer: Medicare (Managed Care)

## 2019-04-25 MED ORDER — BUPROPION XL (DAILY) 150 MG OR TB24
150.0000 mg | ORAL_TABLET | Freq: Every morning | ORAL | 0 refills | Status: DC
Start: 2019-04-25 — End: 2019-05-26

## 2019-04-28 ENCOUNTER — Encounter: Payer: Self-pay | Admitting: Orthopaedic Surgery

## 2019-04-28 ENCOUNTER — Ambulatory Visit
Admission: RE | Admit: 2019-04-28 | Discharge: 2019-04-28 | Disposition: A | Discharge status: Home / Self Care | Payer: Medicare (Managed Care) | Attending: Orthopaedic Surgery | Admitting: Orthopaedic Surgery

## 2019-04-28 ENCOUNTER — Ambulatory Visit (HOSPITAL_BASED_OUTPATIENT_CLINIC_OR_DEPARTMENT_OTHER): Payer: Medicare (Managed Care) | Admitting: Orthopaedic Surgery

## 2019-04-28 VITALS — BP 140/82 | HR 76 | Temp 98.3°F | Resp 17

## 2019-04-28 DIAGNOSIS — M79675 Pain in left toe(s): Secondary | ICD-10-CM

## 2019-04-28 DIAGNOSIS — R2241 Localized swelling, mass and lump, right lower limb: Secondary | ICD-10-CM

## 2019-04-28 DIAGNOSIS — L84 Corns and callosities: Secondary | ICD-10-CM | POA: Insufficient documentation

## 2019-04-28 DIAGNOSIS — M79674 Pain in right toe(s): Secondary | ICD-10-CM

## 2019-04-28 DIAGNOSIS — M25571 Pain in right ankle and joints of right foot: Secondary | ICD-10-CM | POA: Insufficient documentation

## 2019-04-28 DIAGNOSIS — M79672 Pain in left foot: Secondary | ICD-10-CM

## 2019-04-28 DIAGNOSIS — E11610 Type 2 diabetes mellitus with diabetic neuropathic arthropathy: Secondary | ICD-10-CM | POA: Insufficient documentation

## 2019-04-28 DIAGNOSIS — R2242 Localized swelling, mass and lump, left lower limb: Secondary | ICD-10-CM | POA: Insufficient documentation

## 2019-04-28 DIAGNOSIS — M19071 Primary osteoarthritis, right ankle and foot: Secondary | ICD-10-CM

## 2019-04-28 DIAGNOSIS — R52 Pain, unspecified: Secondary | ICD-10-CM

## 2019-04-28 DIAGNOSIS — M7732 Calcaneal spur, left foot: Secondary | ICD-10-CM

## 2019-04-28 DIAGNOSIS — M7731 Calcaneal spur, right foot: Secondary | ICD-10-CM

## 2019-04-28 DIAGNOSIS — M25572 Pain in left ankle and joints of left foot: Secondary | ICD-10-CM | POA: Insufficient documentation

## 2019-04-28 DIAGNOSIS — M79671 Pain in right foot: Secondary | ICD-10-CM

## 2019-04-28 DIAGNOSIS — M19072 Primary osteoarthritis, left ankle and foot: Secondary | ICD-10-CM

## 2019-04-28 NOTE — Progress Notes (Signed)
Julie Reel, MD  Chief - Division of Foot & Ankle Surgery  Assistant Clinical Professor of Orthopaedic Surgery  [employer] [facility] [place], [place]  [address] [place], [place]  [place], [place] [address]    Initial H&P Office Visit    Date: 04/28/2019    Chief Complaint:  Bilateral foot calluses, bilateral ankle masses    History of Present Illness: Patient is a 74 year old female with multiple medical problems including schizophrenia as well as bipolar disorder who presents with bilateral foot calluses along the lateral border of her feet.  Her primary concern is bilateral ankle masses across the anterior and posterior aspects of her ankles which she has noticed over the past 1 year.  She is concerned regarding overall diagnosis.  No previous treatments.  No history trauma.  No previous surgeries to the bilateral feet or ankles.  No pain during standing as well as prolonged walking.  No changes in size per her account.  No fevers or chills.  She is weight-bearing as tolerated in regular shoes.    Past Medical History:  Past Medical History:   Diagnosis Date   . Bipolar affect, depressed (CMS-HCC)    . Chronic back pain    . Hypothyroidism    . Insomnia    . OAB (overactive bladder)    . OCD (obsessive compulsive disorder)    . Osteopenia    . Pure hypercholesterolemia 10/10/2018   . Schizophrenia (CMS-HCC)      Past Surgical History:  Past Surgical History:   Procedure Laterality Date   . Biilat. Knee replacement     . Rt toe surgery     . TOTAL KNEE ARTHROPLASTY Bilateral      Family History:  Family History   Problem Relation Name Age of Onset   . Other Mother          Wegener   . Cancer Brother          Leukemia   . Cancer Father          Leukemia   . Heart Disease Father     . Hypertension Father     . No Known Problems Sister     . Cancer Brother          Bladder Cancer     Social History:  Social History     Socioeconomic History   . Marital status: Married     Spouse name: Not on file   . Number of children:  Not on file   . Years of education: Not on file   . Highest education level: Not on file   Occupational History   . Not on file   Social Needs   . Financial resource strain: Not on file   . Food insecurity     Worry: Not on file     Inability: Not on file   . Transportation needs     Medical: Not on file     Non-medical: Not on file   Tobacco Use   . Smoking status: Former Smoker     Packs/day: 2.00     Years: 30.00     Pack years: 60.00   . Smokeless tobacco: Never Used   Substance and Sexual Activity   . Alcohol use: No     Frequency: Never   . Drug use: No   . Sexual activity: Never   Lifestyle   . Physical activity     Days per week: Not on  file     Minutes per session: Not on file   . Stress: Not on file   Relationships   . Social Wellsite geologist on phone: Not on file     Gets together: Not on file     Attends religious service: Not on file     Active member of club or organization: Not on file     Attends meetings of clubs or organizations: Not on file     Relationship status: Not on file   . Intimate partner violence     Fear of current or ex partner: Not on file     Emotionally abused: Not on file     Physically abused: Not on file     Forced sexual activity: Not on file   Other Topics Concern   . Not on file   Social History Narrative   . Not on file     Medications:   Current Outpatient Medications on File Prior to Visit   Medication Sig Dispense Refill   . ammonium lactate (AMLACTIN) 12 % lotion Apply 1 Application topically 2 times daily. Rub in to affected area well for dry skin 1 bottle 11   . ARIPiprazole (ABILIFY) 30 MG tablet Take 1 tablet (30 mg) by mouth daily. 90 tablet 1   . aspirin 81 MG tablet Take 81 mg by mouth daily.     Marland Kitchen atorvastatin (LIPITOR) 10 MG tablet Take 1 tablet (10 mg) by mouth daily. 90 tablet 3   . BIOTIN PO Take 1 capsule by mouth daily.     Marland Kitchen buPROPion (WELLBUTRIN XL) 150 MG XL tablet Take 1 tablet (150 mg) by mouth every morning. 30 tablet 0   . Calcium Carbonate-Vit  D-Min (CALCIUM 1200 PO) Take 1 tablet by mouth daily.     . celecoxib (CELEBREX) 100 MG capsule Take 1 capsule (100 mg) by mouth 2 times daily. 60 capsule 1   . Cholecalciferol (VITAMIN D3) 50 MCG (2000 UT) capsule Take 1 capsule by mouth daily.     . Clobetasol Propionate (IMPOYZ) 0.025 % CREA Apply topically.     Marland Kitchen desvenlafaxine (PRISTIQ) 50 MG TB24 Take 1 tablet (50 mg) by mouth daily. 90 tablet 1   . econazole (SPECTAZOLE) 1 % cream Apply 1 Application topically 2 times daily. Use a small amount as directed 1 Tube 1   . fluPHENAZine (PROLIXIN) 10 MG tablet Take 1 tablet (10 mg) by mouth daily. 30 tablet 1   . gabapentin (NEURONTIN) 600 MG tablet Take 2 tablets (1,200 mg) by mouth at bedtime. 180 tablet 3   . levothyroxine (SYNTHROID) 137 MCG tablet TK 1 T PO D     . mirabegron (MYRBETRIQ) 50 MG ER tablet Take 50 mg by mouth daily. 90 tablet 3   . nystatin (MYCOSTATIN) 100000 UNIT/GM powder Apply 1 Application topically 3 times daily. Apply to affected area 15 g 0   . orlistat (XENICAL) 120 MG capsule Take 1 capsule (120 mg) by mouth 3 times daily (with meals). 90 capsule 3   . oxybutynin (DITROPAN XL) 10 MG Controlled-Release tablet Take 1 tablet (10 mg) by mouth daily. 90 tablet 3   . sertraline (ZOLOFT) 100 MG tablet Take 100 mg by mouth daily.     . [DISCONTINUED] sertraline (ZOLOFT) 50 MG tablet Take 2 tablets (100 mg) by mouth daily AND 1 tablet (50 mg) every evening. 90 tablet 2   . triamcinolone (KENALOG) 0.1 % ointment Use  twice a day as needed for hand eczema 80 g 2   . trihexyphenidyl (ARTANE) 2 MG tablet Take 0.5 tablets (1 mg) by mouth 3 times daily. 135 tablet 1   . ziprasidone (GEODON) 80 MG capsule Take 1 capsule (80 mg) by mouth 2 times daily (with meals). 180 capsule 1     No current facility-administered medications on file prior to visit.      Allergies:   No known drug allergies    Review of Systems:   Patient denies fevers, chills, chest pain, shortness of breath, and lymphedema.  12 point  ROS negative except as indicated above.    Physical Exam:   04/28/19  1039   BP: 140/82   Pulse: 76   Resp: 17   Temp: 98.3 F (36.8 C)     General: Patient is awake, alert, and oriented to person, place, time and situation. No acute distress.    Psych: Normal affect.  HEENT: Normal exam, clear.  Lymphatic System: No lymphedema or calf tenderness.  Vascular: <2 second capillary refill, +2 pulses.  Skin: Intact.  No open lesions, lacerations.  Neurological: SILT L4-S1 distribution bilaterally.  Motor function 5/5 in all muscle groups.  Cardiovascular: NSR.  Lungs: No wheezes.    Musculoskeletal:  Examination of the patient's bilateral feet demonstrates two 1 x 1 centimeter calluses along the plantar lateral aspect of the feet with moderate pes [place] valgus rigid in nature.  She has small multiple soft tissue masses across the anterior posterior aspect of her ankles consistent with lipomas.  No transillumination.  No redness.  No tenderness to deep palpation.  Gross motor and sensation intact.  Palpable pulses.    Imaging:  Three-views standing of the bilateral ankles ordered and reviewed and interpreted by me today demonstrates normal alignment of the bilateral ankles with minimal arthritis.  She has moderate pes planus with a decrease in the medial arch with talonavicular joint arthritis.    Assessment:  74 year old female with schizophrenia as well as bipolar with bilateral foot calluses as well as bilateral ankle masses consistent with lipomas.    Plan: The risks, benefits, and alternatives of callus paring were discussed in full with the patient and verbal consent was obtained.  After alcohol preparation, a total of 2 calluses measuring 1x1 cm from bilateral plantar midfeet were pared down using a 15 blade knife and pickups down to normal, healthy, surrounding skin.  No bleeding or other complications were noted.    No acute surgical intervention warranted at present time for the patient's bilateral ankle  lipomas as they are not currently symptomatic or painful.  If he does have progressive worsening pain or the enlarged symptoms significantly in size the next step would be surgical excision of the symptomatic lipoma.  We recommended bilateral custom longitudinal medial arch support orthotics as well as maximal as shoe wear for her bilateral feet.    The patient will follow-up with [place] on an as needed basis in the future depending on the progression of their symptoms.  All of the patient's questions and concerns were addressed today in full.    I have personally reviewed and interpreted the patient's prior and current diagnostic testing, relevant labs and imaging, risk factors, and external notes from their other treating physicians in the process of formulating my assessment and plan of care for their current symptoms and pathology.    On the day of the above encounter I obtained relevant medical information, interviewed and examined the  patient, formulated and discussed a treatment plan, provided additional aftercare including medications, tests, procedures, and/or coordinated further care with relevant external physicians and healthcare providers as needed.    ================  Attending Attestation:    I have personally reviewed the key and critical portions of the history, physical exam, assessment, and plan as presented and documented by the resident.  I agree with the medical decision making and the assessment and plan as documented.  My additions and/or revisions are included in the medical record.    Huel Coventry, MD  Chief - Division of Foot & Ankle Surgery  Assistant Clinical Professor of [employer] [facility] [place], [place], [place], [place] [address]

## 2019-04-28 NOTE — Telephone Encounter (Signed)
Done

## 2019-04-29 ENCOUNTER — Encounter: Payer: Self-pay | Admitting: Psychiatry

## 2019-05-05 ENCOUNTER — Ambulatory Visit: Payer: Medicare (Managed Care)

## 2019-05-05 ENCOUNTER — Telehealth: Payer: Self-pay | Admitting: Internal Medicine

## 2019-05-05 NOTE — Telephone Encounter (Signed)
Patient is requesting sooner appt than 05/29/19 and prefers this week and only with Dr. [name].  Patient declined to see other providers with sooner availabilities.    Please call patient back.

## 2019-05-07 ENCOUNTER — Telehealth: Payer: Self-pay | Admitting: Internal Medicine

## 2019-05-07 NOTE — Telephone Encounter (Signed)
Spoke with patient and her spouse as they kept passing the phone back and forth.  Pt is scheduled to see Dr. [name] on 04/15 and they will be out of town.  Asking for appt this or next week instead.  Explained that Dr. [name] is only in the office today and tomorrow and is over-booked, and Monday and Tuesday next week and is over-booked.  Patient c/o dark flakey/pussy area under her left eye that she wants looked at before they travel out of town.  Advised she have this evaluated before next week and offered several options today but declined appt today.  Offered several options for tomorrow and she accepted appt with Dr. Ailene Ards at 10:00. ED precautions reviewed and she verbalized understanding.  Appt booked. Requested 04/15 appt with Dr. [name] be cancelled since they'll be out of town.  Very appreciative.

## 2019-05-07 NOTE — Telephone Encounter (Signed)
Patient is calling states that she needs a refill on   atorvastatin (LIPITOR) 10 MG tablet

## 2019-05-07 NOTE — Telephone Encounter (Signed)
Thank you for the update.    Julie Monroe

## 2019-05-07 NOTE — Telephone Encounter (Signed)
Patients husband is calling states that he made a mistake and telling the patient that she needs a refill on the medication in last message she doesn't need a refill its his that does and I have sent a message under his chart

## 2019-05-08 ENCOUNTER — Ambulatory Visit: Payer: Medicare (Managed Care) | Attending: Internal Medicine | Admitting: Internal Medicine

## 2019-05-08 ENCOUNTER — Ambulatory Visit: Payer: Medicare (Managed Care) | Admitting: Internal Medicine

## 2019-05-08 ENCOUNTER — Encounter: Payer: Self-pay | Admitting: Internal Medicine

## 2019-05-08 VITALS — BP 126/63 | HR 71 | Temp 99.0°F | Resp 16 | Wt 270.3 lb

## 2019-05-08 DIAGNOSIS — Z1283 Encounter for screening for malignant neoplasm of skin: Secondary | ICD-10-CM | POA: Insufficient documentation

## 2019-05-08 DIAGNOSIS — L814 Other melanin hyperpigmentation: Secondary | ICD-10-CM | POA: Insufficient documentation

## 2019-05-08 DIAGNOSIS — Z6841 Body Mass Index (BMI) 40.0 and over, adult: Secondary | ICD-10-CM | POA: Insufficient documentation

## 2019-05-08 DIAGNOSIS — Z7189 Other specified counseling: Secondary | ICD-10-CM | POA: Insufficient documentation

## 2019-05-08 DIAGNOSIS — L570 Actinic keratosis: Secondary | ICD-10-CM | POA: Insufficient documentation

## 2019-05-08 MED ORDER — CALCIUM CARBONATE 600 MG OR TABS
600.0000 mg | ORAL_TABLET | Freq: Two times a day (BID) | ORAL | Status: DC
Start: ? — End: 2019-09-16

## 2019-05-08 MED ORDER — HAIR/SKIN/NAILS PO
ORAL | Status: DC
Start: ? — End: 2019-09-16

## 2019-05-08 NOTE — Progress Notes (Signed)
[place]  [facility]  [address] [place], [place] [address], [phone]    Internal Medicine Visit Note      Patient ID: Julie Monroe is a 74 year old female  DOB: 07-20-1945  MRN: [id number]   PCP: Charlotte Crumb    Encounter date: May 08, 2019     Subjective:   Julie Monroe is a 74 year old female who is here for Derm Problem      C/o lesion under L eye was "rough, dark, scaly, droopy" x3days, thinks improving.  No discharge.  Not painful.  First occurrence    C/o L hand scaly nodule x 1-34mo, thinks becoming more prominent.  No personal or family h/o skin malignancy.    Does not use sun screen.    Reviewed patient's recorded pertinent information related to social history, past medical, past surgical, and family history.     Review of Systems   Constitutional: Negative for activity change, chills and fever.   HENT: Negative for ear pain, trouble swallowing and voice change.    Eyes: Negative for pain and visual disturbance.   Respiratory: Negative for choking and shortness of breath.    Cardiovascular: Negative for chest pain and palpitations.   Gastrointestinal: Negative for nausea and vomiting.   Genitourinary: Negative for dysuria and hematuria.   Musculoskeletal: Negative for gait problem and neck stiffness.   Skin: Positive for color change and rash. Negative for wound.   Allergic/Immunologic: Negative for immunocompromised state.   Neurological: Negative for facial asymmetry, speech difficulty, weakness and numbness.   Psychiatric/Behavioral: Negative for behavioral problems and self-injury.        Current Outpatient Medications   Medication Sig Dispense Refill   . ammonium lactate (AMLACTIN) 12 % lotion Apply 1 Application topically 2 times daily. Rub in to affected area well for dry skin 1 bottle 11   . ARIPiprazole (ABILIFY) 30 MG tablet Take 1 tablet (30 mg) by mouth daily. 90 tablet 1   . aspirin 81 MG tablet Take 81 mg  by mouth daily.     .Marland Kitchenatorvastatin (LIPITOR) 10 MG tablet Take 1 tablet (10 mg) by mouth daily. 90 tablet 3   . BIOTIN PO Take 1 capsule by mouth daily.     . Biotin w/ Vitamins C & E (HAIR/SKIN/NAILS PO)      . buPROPion (WELLBUTRIN XL) 150 MG XL tablet Take 1 tablet (150 mg) by mouth every morning. 30 tablet 0   . Calcium Carbonate 600 MG tablet Take 600 mg by mouth 2 times daily.     . Calcium Carbonate-Vit D-Min (CALCIUM 1200 PO) Take 1 tablet by mouth daily.     . celecoxib (CELEBREX) 100 MG capsule Take 1 capsule (100 mg) by mouth 2 times daily. 60 capsule 1   . Cholecalciferol (VITAMIN D3) 50 MCG (2000 UT) capsule Take 1 capsule by mouth daily.     . Clobetasol Propionate (IMPOYZ) 0.025 % CREA Apply topically.     .Marland Kitchendesvenlafaxine (PRISTIQ) 50 MG TB24 Take 1 tablet (50 mg) by mouth daily. 90 tablet 1   . econazole (SPECTAZOLE) 1 % cream Apply 1 Application topically 2 times daily. Use a small amount as directed 1 Tube 1   . fluPHENAZine (PROLIXIN) 10 MG tablet Take 1 tablet (10 mg) by mouth daily. 30 tablet 1   . gabapentin (NEURONTIN) 600 MG tablet Take 2 tablets (1,200 mg) by mouth at bedtime.  180 tablet 3   . levothyroxine (SYNTHROID) 137 MCG tablet TK 1 T PO D     . mirabegron (MYRBETRIQ) 50 MG ER tablet Take 50 mg by mouth daily. 90 tablet 3   . nystatin (MYCOSTATIN) 100000 UNIT/GM powder Apply 1 Application topically 3 times daily. Apply to affected area 15 g 0   . orlistat (XENICAL) 120 MG capsule Take 1 capsule (120 mg) by mouth 3 times daily (with meals). 90 capsule 3   . oxybutynin (DITROPAN XL) 10 MG Controlled-Release tablet Take 1 tablet (10 mg) by mouth daily. 90 tablet 3   . sertraline (ZOLOFT) 100 MG tablet Take 100 mg by mouth daily.     Marland Kitchen triamcinolone (KENALOG) 0.1 % ointment Use twice a day as needed for hand eczema 80 g 2   . trihexyphenidyl (ARTANE) 2 MG tablet Take 0.5 tablets (1 mg) by mouth 3 times daily. (Patient taking differently: Take 1 mg by mouth. 1 tablet in the morning and 1.5  tablet at bedtime) 135 tablet 1   . ziprasidone (GEODON) 80 MG capsule Take 1 capsule (80 mg) by mouth 2 times daily (with meals). 180 capsule 1     No current facility-administered medications for this visit.      No Known Allergies    Objective:  Vital signs: BP 126/63 (BP Location: Left arm, BP Patient Position: Sitting, BP cuff size: Large)   Pulse 71   Temp 99 F (37.2 C) (Tympanic)   Resp 16   Wt 122.6 kg (270 lb 4.5 oz)   LMP  (LMP Unknown)   BMI 43.62 kg/m     Physical Exam  Vitals signs and nursing note reviewed.   Constitutional:       General: She is not in acute distress.     Appearance: Normal appearance. She is obese. She is not ill-appearing, toxic-appearing or diaphoretic.      Comments: Light skin F, light blond/brown hair   HENT:      Head: Normocephalic and atraumatic.      Right Ear: External ear normal.      Left Ear: External ear normal.      Nose: Nose normal. No rhinorrhea.   Eyes:      General:         Right eye: No discharge.         Left eye: No discharge.      Extraocular Movements: Extraocular movements intact.      Pupils: Pupils are equal, round, and reactive to light.   Neck:      Musculoskeletal: Normal range of motion and neck supple. No neck rigidity.   Cardiovascular:      Rate and Rhythm: Normal rate and regular rhythm.      Heart sounds: Normal heart sounds. No murmur. No friction rub. No gallop.    Pulmonary:      Effort: Pulmonary effort is normal. No respiratory distress.      Breath sounds: Normal breath sounds. No wheezing, rhonchi or rales.   Abdominal:      Palpations: Abdomen is soft.      Tenderness: There is no guarding or rebound.   Musculoskeletal: Normal range of motion.         General: No tenderness, deformity or signs of injury.   Skin:     General: Skin is warm.      Findings: Lesion present. No bruising, erythema or rash.      Comments: +irregular brownish maculo-papular lesion ~4-37m  diam below L eye, w overlying fine scale, textured.  +about 39m diam  skin-colored textured ovoid nodule L dorsal hand  +hands, forearms, lower legs, trunk evaluated, multiple macular irregular brownish lesions, other than above two, none stand apart from rest   Neurological:      General: No focal deficit present.      Mental Status: She is alert. Mental status is at baseline.      Cranial Nerves: No cranial nerve deficit.      Sensory: No sensory deficit.      Gait: Gait normal.   Psychiatric:         Mood and Affect: Mood normal.         Behavior: Behavior normal.         Thought Content: Thought content normal.         Judgment: Judgment normal.           Diagnostic data:    Lab Results   Component Value Date    WBCCOUNT 5.8 12/12/2018    HGB 13.5 12/12/2018    HCT 40.6 12/12/2018    MCV 87.6 12/12/2018    PLT 178 12/12/2018     Lab Results   Component Value Date    K 4.5 11/26/2018    CL 105 11/26/2018    BUN 18 11/26/2018    CREAT 0.81 11/26/2018    GLU 90 11/26/2018    [place] 9.7 11/26/2018     Lab Results   Component Value Date    AST 15 11/26/2018    ALT 9 11/26/2018    ALK 101 11/26/2018    ALB 4.2 11/26/2018    TBILI 1.0 11/26/2018     Lab Results   Component Value Date    CHOL 182 11/26/2018    CHOL 240 (H) 07/09/2018    TRIG 66 11/26/2018    TRIG 62 07/09/2018     Lab Results   Component Value Date    TSH 0.86 11/26/2018    TSH 0.67 07/09/2018     Lab Results   Component Value Date    A1C 5.3 11/26/2018    A1C 5.1 07/09/2018     No results found for: MALB, MCR        Assessment/Plan:    CGabriellahwas seen today for derm problem.    Diagnoses and all orders for this visit:    Actinic keratosis  -     Concerning PE, two lesions stand apart from rest  - derm referral for dx/management, esp clarify if AK vs SCC  - Consult/Referral to Dermatology Clinic  Solar lentigo  - multiple all over body  - reassurance provided  - discussed pt is high risk for skin issues, emphasized monitoring and regular sun screen use  Skin exam for malignant neoplasm  - >60% BSA evaluated, no other overt  concerning lesions noted  - Recc ABCDE of skin lesion monitoring, reviewed: eval Asymmetry, Border clear vs not, Color if one vs multiple, Diameter if smaller vs larger than 658m(head of pencil-head eraser), Evolution. Advised pt RTC if any red flags, any changes w skin lesions as noted above  - recc sun screen use >SPF50 when under sun, with reapplication [id number] and coordination of care           Diagnosis, work up, findings and current plan were discussed with patient. The patient verbalized understanding and agreement with plan.  More than 50 percent of the visit was spent counseling  and coordinating care.    Patient advised to call our office with any other questions.    Electronically signed by  Crecencio Mc, MD

## 2019-05-08 NOTE — Patient Instructions (Addendum)
-- coordinate dermatology eval  -- return to clinic for routine eval          Actinic Keratosis  An actinic keratosis is a precancerous growth on the skin. If there is more than one growth, the condition is called actinic keratoses. Actinic keratoses appear most often on areas of skin that get a lot of sun exposure, including the scalp, face, ears, lips, upper back, forearms, and the backs of the hands.  If left untreated, these growths may develop into a skin cancer called squamous cell carcinoma. It is important to have all these growths checked by a health care provider to determine the best treatment approach.  What are the causes?  Actinic keratoses are caused by getting too much ultraviolet (UV) radiation from the sun or other UV light sources.  What increases the risk?  You are more likely to develop this condition if you:   Have light-colored skin and blue eyes.   Have blond or red hair.   Spend a lot of time in the sun.   Do not protect your skin from the sun when outdoors.   Are an older person. The risk of developing an actinic keratosis increases with age.  What are the signs or symptoms?  Actinic keratoses feel like scaly, rough spots of skin. Symptoms of this condition include growths that may:   Be as small as a pinhead or as big as a quarter.   Itch, hurt, or feel sensitive.   Be skin-colored, light tan, dark tan, pink, or a combination of any of these colors. In most cases, the growths become red.   Have a small piece of pink or gray skin (skin tag) growing from them.  It may be easier to notice actinic keratoses by feeling them, rather than seeing them. Sometimes, actinic keratoses disappear, but many reappear a few days to a few weeks later.  How is this diagnosed?  This condition is usually diagnosed with a physical exam.   A tissue sample may be removed from the actinic keratosis and examined under a microscope (biopsy).  How is this treated?  If needed, this condition may be treated  by:   Scraping off the actinic keratosis (curettage).   Freezing the actinic keratosis with liquid nitrogen (cryosurgery). This causes the growth to eventually fall off the skin.   Applying medicated creams or gels to destroy the cells in the growth.   Applying chemicals to the actinic keratosis to make the outer layers of skin peel off (chemical peel).   Using photodynamic therapy. In this procedure, medicated cream is applied to the actinic keratosis. This cream increases your skin's sensitivity to light. Then, a strong light is aimed at the actinic keratosis to destroy cells in the growth.  Follow these instructions at home:  Skin care   Apply cool, wet cloths (cool compresses) to the affected areas.   Do not scratch your skin.   Check your skin regularly for any growths, especially growths that:  ? Start to itch or bleed.  ? Change in size, shape, or color.  Caring for the treated area   Keep the treated area clean and dry as told by your health care provider.   Do not apply any medicine, cream, or lotion to the treated area unless your health care provider tells you to do that.   Do not pick at blisters or try to break them open. This can cause infection and scarring.   If you have red  or irritated skin after treatment, follow instructions from your health care provider about how to take care of the treated area. Make sure you:  ? Wash your hands with soap and water before you change your bandage (dressing). If soap and water are not available, use hand sanitizer.  ? Change your dressing as told by your health care provider.   If you have red or irritated skin after treatment, check your treated area every day for signs of infection. Check for:  ? Redness, swelling, or pain.  ? Fluid or blood.  ? Warmth.  ? Pus or a bad smell.  General instructions   Take or apply over-the-counter and prescription medicines only as told by your health care provider.   Return to your normal activities as told by  your health care provider. Ask your health care provider what activities are safe for you.   Have a skin exam done every year by a health care provider who is a skin specialist (dermatologist).   Keep all follow-up visits as told by your health care provider. This is important.  Lifestyle   Do not use any products that contain nicotine or tobacco, such as cigarettes and e-cigarettes. If you need help quitting, ask your health care provider.   Take steps to protect your skin from the sun.  ? Try to avoid the sun between 10:00 a.m. and 4:00 p.m. This is when the UV light is the strongest.  ? Use a sunscreen or sunblock with SPF 30 (sun protection factor 30) or greater.  ? Apply sunscreen before you are exposed to sunlight and reapply as often as directed by the instructions on the sunscreen container.  ? Always wear sunglasses that have UV protection, and always wear a hat and clothing to protect your skin from sunlight.  ? When possible, avoid medicines that increase your sensitivity to sunlight.  ? Do not use tanning beds or other indoor tanning devices.  Contact a health care provider if:   You notice any changes or new growths on your skin.   You have swelling, pain, or more redness around your treated area.   You have fluid or blood coming from your treated area.   Your treated area feels warm to the touch.   You have pus or a bad smell coming from your treated area.   You have a fever.   You have a blister that becomes large and painful.  Summary   An actinic keratosis is a precancerous growth on the skin. If there is more than one growth, the condition is called actinic keratoses. In some cases, if left untreated, these growths can develop into skin cancer.   Check your skin regularly for any growths, especially growths that start to itch or bleed, or change in size, shape, or color.   Take steps to protect your skin from the sun.   Contact a health care provider if you notice any changes or new  growths on your skin.   Keep all follow-up visits as told by your health care provider. This is important.  This information is not intended to replace advice given to you by your health care provider. Make sure you discuss any questions you have with your health care provider.  Document Released: 04/28/2008 Document Revised: 06/12/2017 Document Reviewed: 06/12/2017  Elsevier Interactive Patient Education  2019 [employer].

## 2019-05-09 ENCOUNTER — Telehealth: Payer: Self-pay | Admitting: Internal Medicine

## 2019-05-09 NOTE — Telephone Encounter (Signed)
Pt would like all his medications go to this pharmacy going forward  - [facility] - [address] [place] [address]. Pls update file.Thank you

## 2019-05-09 NOTE — Telephone Encounter (Signed)
Rosie, Please update pharmacy if needed and contact pt to clarify if she needs any refills now. Thank you.

## 2019-05-13 ENCOUNTER — Ambulatory Visit: Payer: Medicare (Managed Care) | Attending: Internal Medicine | Admitting: Internal Medicine

## 2019-05-13 ENCOUNTER — Encounter: Payer: Self-pay | Admitting: Internal Medicine

## 2019-05-13 ENCOUNTER — Encounter: Payer: Self-pay | Admitting: Psychiatry

## 2019-05-13 ENCOUNTER — Ambulatory Visit: Payer: Medicare (Managed Care) | Admitting: Cardiovascular Disease

## 2019-05-13 DIAGNOSIS — E782 Mixed hyperlipidemia: Secondary | ICD-10-CM

## 2019-05-13 DIAGNOSIS — E039 Hypothyroidism, unspecified: Secondary | ICD-10-CM

## 2019-05-13 DIAGNOSIS — F250 Schizoaffective disorder, bipolar type: Secondary | ICD-10-CM | POA: Insufficient documentation

## 2019-05-13 DIAGNOSIS — Z6841 Body Mass Index (BMI) 40.0 and over, adult: Secondary | ICD-10-CM | POA: Insufficient documentation

## 2019-05-13 NOTE — Patient Instructions (Addendum)
Thank you for your visit today! You are doing a good job with weight loss  - Please aim to walk at least 10 minutes every day. Increase your walking time by about 5 minutes every week to try and aim to get up to 30 minutes of walking a day. For example, you may increase your walking time to 15 minutes a day after the first week then increase to 20 minutes a day the week after that  - Please start intermittent fasting with at least 12 hours a day, with the goal to increase to 16 hours a day with an 8 hour period of eating. For example, to start you may start your first meal of the day at 6 am and aim to finish eating by 6 pm for the first week (12 hours of fasting). You could then start eating later at 8 am and finish by 6 pm (14 hours of fasting) the next week, followed by starting eating at 10 am then finishing by 6 pm (16 hours of fasting). The goal is to have 16 hours of fasting a day. During your fasting hours, you may have water and unsweetened black coffee or tea  - You may also consider tracking your steps on your phone and aim for 6,000-8,000 steps daily   - If your weight hits a plateau, consider starting orlistat (alli). Take it 30 minutes before your main meals

## 2019-05-13 NOTE — Progress Notes (Signed)
Bariatric Medicine consultation  PCP: Charlotte Crumb  Referred by: Mortimer Fries Sasson      Date:05/13/2019    Julie Monroe is a 74 year old female with hx of schizoaffective disorder, osteoarthritis s/p bilateral TKA, HLD, and hypothyroidism who is here bariatrics follow up, last seen on 10/10/18    Wt 280 previously in August 2020, currently 264 lb today.   Discussed intermittent fasting at previous visit, aiming for 30 minutes of walking, and orlistat before meals   Patient reports that she doesn't eat 3 meals a day, has multiple small meals 5 times a day. Has not been on orlistat since trying it one time, no side effects. Not currently interested in bariatric surgery     Diet  States that she is not overeating/binging as she used to, trying to eat more salads and fruits. Cutting down on snacks desserts and sweets. Still has intermittent diet sodas. Did not try intermittent fasting yet, but is interested in starting.      Exercise  - Still not exercising, limited primarily due motivation. Previously discussed aiming for 30 minutes of walking daily. Previously was walking up to 5 miles daily but has been quite sedentary recently     #Schizoaffective disorder, bipolar type  #Anxiety  - Currently on several antipsychotics including fluphenazine, ziprasidone, aripiprazole. Following [facility] psych, mood stable   - Switched zoloft to Wellbutrin recently, also on desvenlafaxine and gabapentin    #Hypothyroidism  Last TSH in Oct/2020 wnl, denies any temperature sensitivity     #Osteoarthritis s/p bilateral TKA  - Denies any knee pain currently    Past Medical and Surgical History:  Past Medical History:   Diagnosis Date   . Bipolar affect, depressed (CMS-HCC)    . Chronic back pain    . Hypothyroidism    . Insomnia    . OAB (overactive bladder)    . OCD (obsessive compulsive disorder)    . Osteopenia    . Pure hypercholesterolemia 10/10/2018   . Schizophrenia (CMS-HCC)      Past Surgical History:   Procedure Laterality Date   .  Biilat. Knee replacement     . Rt toe surgery     . TOTAL KNEE ARTHROPLASTY Bilateral        Allergies:  No Known Allergies    Medications:  Current Outpatient Medications   Medication Sig Dispense Refill   . ammonium lactate (AMLACTIN) 12 % lotion Apply 1 Application topically 2 times daily. Rub in to affected area well for dry skin 1 bottle 11   . ARIPiprazole (ABILIFY) 30 MG tablet Take 1 tablet (30 mg) by mouth daily. 90 tablet 1   . aspirin 81 MG tablet Take 81 mg by mouth daily.     Marland Kitchen atorvastatin (LIPITOR) 10 MG tablet Take 1 tablet (10 mg) by mouth daily. 90 tablet 3   . BIOTIN PO Take 1 capsule by mouth daily.     . Biotin w/ Vitamins C & E (HAIR/SKIN/NAILS PO)      . buPROPion (WELLBUTRIN XL) 150 MG XL tablet Take 1 tablet (150 mg) by mouth every morning. 30 tablet 0   . Calcium Carbonate 600 MG tablet Take 600 mg by mouth 2 times daily.     . Calcium Carbonate-Vit D-Min (CALCIUM 1200 PO) Take 1 tablet by mouth daily.     . celecoxib (CELEBREX) 100 MG capsule Take 1 capsule (100 mg) by mouth 2 times daily. 60 capsule 1   . Cholecalciferol (VITAMIN D3) 50  MCG (2000 UT) capsule Take 1 capsule by mouth daily.     . Clobetasol Propionate (IMPOYZ) 0.025 % CREA Apply topically.     Marland Kitchen desvenlafaxine (PRISTIQ) 50 MG TB24 Take 1 tablet (50 mg) by mouth daily. 90 tablet 1   . econazole (SPECTAZOLE) 1 % cream Apply 1 Application topically 2 times daily. Use a small amount as directed 1 Tube 1   . fluPHENAZine (PROLIXIN) 10 MG tablet Take 1 tablet (10 mg) by mouth daily. 30 tablet 1   . gabapentin (NEURONTIN) 600 MG tablet Take 2 tablets (1,200 mg) by mouth at bedtime. 180 tablet 3   . levothyroxine (SYNTHROID) 137 MCG tablet TK 1 T PO D     . mirabegron (MYRBETRIQ) 50 MG ER tablet Take 50 mg by mouth daily. 90 tablet 3   . nystatin (MYCOSTATIN) 100000 UNIT/GM powder Apply 1 Application topically 3 times daily. Apply to affected area 15 g 0   . orlistat (XENICAL) 120 MG capsule Take 1 capsule (120 mg) by mouth 3 times  daily (with meals). 90 capsule 3   . oxybutynin (DITROPAN XL) 10 MG Controlled-Release tablet Take 1 tablet (10 mg) by mouth daily. 90 tablet 3   . sertraline (ZOLOFT) 100 MG tablet Take 100 mg by mouth daily.     Marland Kitchen triamcinolone (KENALOG) 0.1 % ointment Use twice a day as needed for hand eczema 80 g 2   . trihexyphenidyl (ARTANE) 2 MG tablet Take 0.5 tablets (1 mg) by mouth 3 times daily. (Patient taking differently: Take 1 mg by mouth. 1 tablet in the morning and 1.5 tablet at bedtime) 135 tablet 1   . ziprasidone (GEODON) 80 MG capsule Take 1 capsule (80 mg) by mouth 2 times daily (with meals). 180 capsule 1     No current facility-administered medications for this visit.        Social History:  Social History     Socioeconomic History   . Marital status: Married     Spouse name: Not on file   . Number of children: Not on file   . Years of education: Not on file   . Highest education level: Not on file   Occupational History   . Not on file   Tobacco Use   . Smoking status: Former Smoker     Packs/day: 2.00     Years: 30.00     Pack years: 60.00   . Smokeless tobacco: Never Used   Substance and Sexual Activity   . Alcohol use: No     Frequency: Never   . Drug use: No   . Sexual activity: Never   Social Activities of Daily Living Present   . Not on file   Social History Narrative   . Not on file       Family History:  Family History   Problem Relation Name Age of Onset   . Other Mother          Wegener   . Cancer Brother          Leukemia   . Cancer Father          Leukemia   . Heart Disease Father     . Hypertension Father     . No Known Problems Sister     . Cancer Brother          Bladder Cancer       Review of Systems:  Positive review of systems bolded  Constitutional:  Fever, chills, weight loss, weight gain  Head & Neck:  Headache, vision change, hearing change, rhinorrhea, sore throat  Respiratory:  Shortness of breath, cough, sputum, hemoptysis  Cardiovascular:  Chest pain/pressure, orthopnea, PND,  palpitations  Gastrointestinal:  Abdominal pain, nausea, vomiting, diarrhea, melena, rectal bleeding  Genitourinary:  Dysuria, polyuria, hesitancy, frequency  Musculoskeletal:  Joint pain, leg edema  Neurological:  Focal numbness, weakness, seizure      Physical Exam:   Vital Signs: BP 115/65 (BP Location: Left arm, BP Patient Position: Sitting, BP cuff size: Regular)   Pulse 65   Temp 97.1 F (36.2 C) (Temporal)   Resp 16   Wt 119.9 kg (264 lb 5.3 oz)   LMP  (LMP Unknown)   SpO2 92%   BMI 42.66 kg/m   General:  Obese female in NAD   HEENT:  PERRL, EOMI, clear oropharynx, no LAD  Neck:   supple, no JVD  Lungs:  CTAB, no crackles or wheezing      Heart:  RRR, normal S1/S2, no murmurs  Abdomen:  soft, non-distended, non-tender, normo-active bowel sounds, no palpable masses  Extremities:  2+ pulses in lower extremities, no lower extremity edema  Neurological:  alert and oriented x 4, motor strength and sensation grossly intact, some resting tremor in hands  Skin: warm and dry, no rashes or lesions     Labs and Other Data:    Hgb A1C   Date Value Ref Range Status   11/26/2018 5.3 <5.7 % of total Hgb Final     Comment:     For the purpose of screening for the presence of  diabetes:     <5.7%       Consistent with the absence of diabetes  5.7-6.4%    Consistent with increased risk for diabetes              (prediabetes)  > or =6.5%  Consistent with diabetes     This assay result is consistent with a decreased risk  of diabetes.     Currently, no consensus exists regarding use of  hemoglobin A1c for diagnosis of diabetes in children.     According to American Diabetes Association (ADA)  guidelines, hemoglobin A1c <7.0% represents optimal  control in non-pregnant diabetic patients. Different  metrics may apply to specific patient populations.   Standards of Medical Care in Diabetes(ADA).             TSH   Date Value Ref Range Status   11/26/2018 0.86 0.40 - 4.50 mIU/L Final   ; fT4:   Free T4   Date Value Ref Range  Status   11/26/2018 1.3 0.8 - 1.8 ng/dL Final       Assessment and Care Plan:    Ameirah Khatoon is a 74 year old female with hx of schizoaffective disorder, osteoarthritis s/p bilateral TKA, HLD, and hypothyroidism who is here bariatrics follow up    #Morbid obesity - BMI 42   Weight downtrending to 264 from 280 lb in August 2020. Comorbidities include HLD and osteoarthritis. Minimal exercise, however with some dietary changes from prior. Atypical antipsychotics likely also contributing to weight. Not interested in bariatric surgery  - Discussed starting intermittent fasting, patient interested in trying  - Discussed aiming for at least 10 minutes of walking daily, with goal of increasing to 30 minutes daily  - Consider starting Orlistat if weight plateaus    #Schizoaffective disorder, bipolar type  #Anxiety  Mood stable, medications managed per psych     #  HLD  ASCVD 8%  - Continue atorvastatin 10 mg daily     #Hypothyroidism  Last TSH in Oct/2020 wnl  - Continue synthroid 137 mcg     #Osteoarthritis s/p bilateral TKA  Stable, not limiting movement     Follow up in 3 months     Future Appointments   Date Time Provider Department Center   05/13/2019  1:50 PM Meridee Score, MD [place] [facility] [place]   06/06/2019  1:30 PM Park, Caren Macadam, MD [facility]   07/01/2019 11:00 AM Test, [facility] [place]   07/07/2019 11:00 AM Lona Millard, MD [facility]   07/29/2019 10:20 AM Leonia Reader, MD [place] [facility] [place]       Patient Instructions   Thank you for your visit today! You are doing a good job with weight loss  - Please aim to walk at least 10 minutes every day. Increase your walking time by about 5 minutes every week to try and aim to get up to 30 minutes of walking a day. For example, you may increase your walking time to 15 minutes a day after the first week then increase to 20 minutes a day the week after that  - Please start intermittent fasting with at least  12 hours a day, with the goal to increase to 16 hours a day with an 8 hour period of eating. For example, to start you may start your first meal of the day at 6 am and aim to finish eating by 6 pm for the first week (12 hours of fasting). You could then start eating later at 8 am and finish by 6 pm (14 hours of fasting) the next week, followed by starting eating at 10 am then finishing by 6 pm (16 hours of fasting). The goal is to have 16 hours of fasting a day. During your fasting hours, you may have water and unsweetened black coffee or tea  - You may also consider tracking your steps on your phone and aim for 6,000-8,000 steps daily   - If your weight hits a plateau, consider starting orlistat (alli). Take it 30 minutes before your main meals          Voice Recognition Disclaimer - Due to possible errors in transcription, there may be errors in documentation: Due to voice recognition software, sound alike and misspelled words may be contained in the documentation. Portions of this chart may have been created with M-Modal Fluency Direct Voice Recognition Software. Occasional wrong-word or sound-alike substitutions may have occurred due to the inherent limitations of voice recognition software. Please read the chart carefully and recognize, using context, where these substitutions may have occurred.           Note Author: Elige Ko,   05/13/19, 1:45 PM      .  Patient seen and examined with Resident. I spoke to the patient and  examined the patient myself. The assessment and plan was made as a team. I have edited and updated the notes and the final note reflects this edited version.    B.Roddrick Sharron, MD

## 2019-05-14 ENCOUNTER — Telehealth: Payer: Self-pay | Admitting: Internal Medicine

## 2019-05-14 NOTE — Telephone Encounter (Signed)
Pt requesting to get referral to see Dr Charma Igo . Pls assist . Thank you

## 2019-05-15 NOTE — Telephone Encounter (Signed)
Spoke with patient who last saw Dr. Priscille Heidelberg on 03/21/2018 for dry eyes, MGD and bilat cataracts and was instructed to follow up in 4 months, but never did.  Now has new insurance, so needs new auth. Please advise.

## 2019-05-16 ENCOUNTER — Telehealth: Payer: Self-pay | Admitting: Dermatology

## 2019-05-16 NOTE — Telephone Encounter (Signed)
Patient is asking for a sooner appointment.  Patient's PCP is afraid that the spot of skin under patient's eye is cancerous.    Please advise and assist  Thank you

## 2019-05-19 NOTE — Telephone Encounter (Signed)
Rescheduled to 4/12 at 0315

## 2019-05-20 ENCOUNTER — Telehealth: Payer: Self-pay | Admitting: Internal Medicine

## 2019-05-20 ENCOUNTER — Encounter: Payer: Self-pay | Admitting: Psychiatry

## 2019-05-20 NOTE — Telephone Encounter (Signed)
Patient would like to ask Dr [name] if she should see her previous ob/gyn because  she's due for her WWE. Or if Dr [name] can give her a referral to a ob/gyn at [facility]. Please follow up, thank you.

## 2019-05-23 ENCOUNTER — Telehealth: Payer: Self-pay | Admitting: Urology

## 2019-05-23 ENCOUNTER — Other Ambulatory Visit: Payer: Self-pay

## 2019-05-23 MED ORDER — ARIPIPRAZOLE 30 MG OR TABS
30.0000 mg | ORAL_TABLET | Freq: Every day | ORAL | 1 refills | Status: DC
Start: 2019-05-23 — End: 2019-06-13

## 2019-05-23 NOTE — Telephone Encounter (Signed)
Patient is requesting telemedicine appt.     She is having LUTS and may also needs refill on meds.     She may be needed for appt.     Please call patient back to further assist.     Thank you

## 2019-05-23 NOTE — Telephone Encounter (Signed)
Pt called requesting refill on Gabapentin and Wellbutrin. She is out of both medications and needs them nightly so she is requesting urgent refill before end of clinic today.

## 2019-05-26 ENCOUNTER — Telehealth: Payer: Self-pay | Admitting: Psychiatry

## 2019-05-26 ENCOUNTER — Encounter: Payer: Self-pay | Admitting: Dermatology

## 2019-05-26 ENCOUNTER — Ambulatory Visit (INDEPENDENT_AMBULATORY_CARE_PROVIDER_SITE_OTHER): Payer: Medicare (Managed Care) | Admitting: Dermatology

## 2019-05-26 ENCOUNTER — Encounter: Payer: Self-pay | Admitting: Psychiatry

## 2019-05-26 ENCOUNTER — Other Ambulatory Visit: Payer: Self-pay | Admitting: Psychiatry

## 2019-05-26 VITALS — BP 136/83 | HR 80 | Temp 98.9°F | Wt 264.6 lb

## 2019-05-26 DIAGNOSIS — L578 Other skin changes due to chronic exposure to nonionizing radiation: Secondary | ICD-10-CM

## 2019-05-26 DIAGNOSIS — L853 Xerosis cutis: Secondary | ICD-10-CM

## 2019-05-26 DIAGNOSIS — L304 Erythema intertrigo: Secondary | ICD-10-CM

## 2019-05-26 DIAGNOSIS — B078 Other viral warts: Secondary | ICD-10-CM

## 2019-05-26 DIAGNOSIS — L299 Pruritus, unspecified: Secondary | ICD-10-CM

## 2019-05-26 DIAGNOSIS — L821 Other seborrheic keratosis: Secondary | ICD-10-CM

## 2019-05-26 MED ORDER — AMMONIUM LACTATE 12 % EX LOTN
1.0000 | TOPICAL_LOTION | Freq: Two times a day (BID) | CUTANEOUS | 11 refills | Status: DC
Start: 2019-05-26 — End: 2019-09-16

## 2019-05-26 MED ORDER — GABAPENTIN 600 MG OR TABS
1200.0000 mg | ORAL_TABLET | Freq: Every evening | ORAL | 3 refills | Status: DC
Start: 2019-05-26 — End: 2019-07-15

## 2019-05-26 MED ORDER — AMMONIUM LACTATE 12 % EX CREA
1.0000 | TOPICAL_CREAM | Freq: Two times a day (BID) | CUTANEOUS | 11 refills | Status: DC
Start: 2019-05-26 — End: 2019-05-26

## 2019-05-26 MED ORDER — BUPROPION XL (DAILY) 150 MG OR TB24
150.0000 mg | ORAL_TABLET | Freq: Every morning | ORAL | 0 refills | Status: DC
Start: 2019-05-26 — End: 2019-06-19

## 2019-05-26 MED ORDER — NYSTATIN 100000 UNIT/GM EX POWD
1.0000 | Freq: Three times a day (TID) | CUTANEOUS | 3 refills | Status: DC
Start: 2019-05-26 — End: 2020-01-29

## 2019-05-26 NOTE — Telephone Encounter (Signed)
Pt called frustrated has left mychart messages regarding refills for gabapentin and wellbutrin. Requesting refills be done today. Stated if not done today will come personally to the clinic. Please call pt once refill  Has been completed

## 2019-05-26 NOTE — Telephone Encounter (Signed)
Spoke with patient who understands that most insurance companies will cover GYN for a WWE without a referral, but she states she also has a "wart" down there that she wants the GYN to address, so wonders if she should have a referral just in case - for WWE and eval of wart in vaginal area.  Please advise.

## 2019-05-26 NOTE — Telephone Encounter (Signed)
Called pt to let her know.

## 2019-05-26 NOTE — Telephone Encounter (Signed)
Ok to tee up.  Thank you.

## 2019-05-26 NOTE — Patient Instructions (Signed)
Cryotherapy (liquid nitrogen) was performed.  Expect redness and irritation of the treated sites over the next several days.  The sites may scab and/or blister.  You can apply vaseline several times daily to help it heal.  Please protect the treated area from the sun.  Please contact the clinic should you have any questions or concerns.

## 2019-05-26 NOTE — Progress Notes (Signed)
[facility]  [email or web address]  Phone: [phone]   Fax: [phone]  PATIENT: Julie Monroe  MRN: [id number]  DOB: [birth year]  DATE OF SERVICE: 05/26/2019    REFERRING PRACTITIONER: Self, Referred  PRIMARY CARE PROVIDER: Leonia Reader  CHIEF COMPLAINT: rash    Subjective:   Julie Monroe is a 74 year old female with history of schizoaffective disorder who was seen in clinic for new concerns today of spot that showed up on her left hand that's itchy and raised and she tends to pick it. She also has a new brown spots on her left cheek that she has picked, asymptomatic, not changing or growing.     Her intertrigo is well controlled with nystatin powder. Here with her husband. No other skin concerns.     No personal history of skin cancer  No family history of melanoma    Past Medical History:   Diagnosis Date   . Bipolar affect, depressed (CMS-HCC)    . Chronic back pain    . Hypothyroidism    . Insomnia    . OAB (overactive bladder)    . OCD (obsessive compulsive disorder)    . Osteopenia    . Pure hypercholesterolemia 10/10/2018   . Schizophrenia (CMS-HCC)      Past Surgical History:   Procedure Laterality Date   . Biilat. Knee replacement     . Rt toe surgery     . TOTAL KNEE ARTHROPLASTY Bilateral      Family History   Problem Relation Name Age of Onset   . Other Mother          Wegener   . Cancer Brother          Leukemia   . Cancer Father          Leukemia   . Heart Disease Father     . Hypertension Father     . No Known Problems Sister     . Cancer Brother          Bladder Cancer     Social History     Socioeconomic History   . Marital status: Married     Spouse name: Not on file   . Number of children: Not on file   . Years of education: Not on file   . Highest education level: Not on file   Occupational History   . Not on file   Social Needs   . Financial resource strain: Not on file   . Food insecurity     Worry: Not on file     Inability: Not on file   . Transportation needs     Medical: Not  on file     Non-medical: Not on file   Tobacco Use   . Smoking status: Former Smoker     Packs/day: 2.00     Years: 30.00     Pack years: 60.00   . Smokeless tobacco: Never Used   Substance and Sexual Activity   . Alcohol use: No     Frequency: Never   . Drug use: No   . Sexual activity: Never   Lifestyle   . Physical activity     Days per week: Not on file     Minutes per session: Not on file   . Stress: Not on file   Relationships   . Social Wellsite geologist on phone: Not on file     Gets together: Not on file  Attends religious service: Not on file     Active member of club or organization: Not on file     Attends meetings of clubs or organizations: Not on file     Relationship status: Not on file   . Intimate partner violence     Fear of current or ex partner: Not on file     Emotionally abused: Not on file     Physically abused: Not on file     Forced sexual activity: Not on file   Other Topics Concern   . Not on file   Social History Narrative   . Not on file     Outpatient Medications Marked as Taking for the 05/26/19 encounter (Office Visit) with Crosby Oyster, MD   Medication Sig Dispense Refill   . ammonium lactate (AMLACTIN) 12 % lotion Apply 1 Application topically 2 times daily. Rub in to affected area well for dry skin 1 bottle 11   . ARIPiprazole (ABILIFY) 30 MG tablet Take 1 tablet (30 mg) by mouth daily. 90 tablet 1   . aspirin 81 MG tablet Take 81 mg by mouth daily.     Marland Kitchen atorvastatin (LIPITOR) 10 MG tablet Take 1 tablet (10 mg) by mouth daily. 90 tablet 3   . BIOTIN PO Take 1 capsule by mouth daily.     . Biotin w/ Vitamins C & E (HAIR/SKIN/NAILS PO)      . buPROPion (WELLBUTRIN XL) 150 MG XL tablet Take 1 tablet (150 mg) by mouth every morning. 30 tablet 0   . Calcium Carbonate 600 MG tablet Take 600 mg by mouth 2 times daily.     . Calcium Carbonate-Vit D-Min (CALCIUM 1200 PO) Take 1 tablet by mouth daily.     . celecoxib (CELEBREX) 100 MG capsule Take 1 capsule (100 mg) by  mouth 2 times daily. 60 capsule 1   . Cholecalciferol (VITAMIN D3) 50 MCG (2000 UT) capsule Take 1 capsule by mouth daily.     . Clobetasol Propionate (IMPOYZ) 0.025 % CREA Apply topically.     Marland Kitchen desvenlafaxine (PRISTIQ) 50 MG TB24 Take 1 tablet (50 mg) by mouth daily. 90 tablet 1   . econazole (SPECTAZOLE) 1 % cream Apply 1 Application topically 2 times daily. Use a small amount as directed 1 Tube 1   . fluPHENAZine (PROLIXIN) 10 MG tablet Take 1 tablet (10 mg) by mouth daily. 30 tablet 1   . gabapentin (NEURONTIN) 600 MG tablet Take 2 tablets (1,200 mg) by mouth at bedtime. 180 tablet 3   . levothyroxine (SYNTHROID) 137 MCG tablet TK 1 T PO D     . mirabegron (MYRBETRIQ) 50 MG ER tablet Take 50 mg by mouth daily. 90 tablet 3   . nystatin (MYCOSTATIN) 100000 UNIT/GM powder Apply 1 Application topically 3 times daily. Apply to affected area 60 g 3   . orlistat (XENICAL) 120 MG capsule Take 1 capsule (120 mg) by mouth 3 times daily (with meals). 90 capsule 3   . triamcinolone (KENALOG) 0.1 % ointment Use twice a day as needed for hand eczema 80 g 2   . trihexyphenidyl (ARTANE) 2 MG tablet Take 0.5 tablets (1 mg) by mouth 3 times daily. (Patient taking differently: Take 1 mg by mouth. 1 tablet in the morning and 1.5 tablet at bedtime) 135 tablet 1   . ziprasidone (GEODON) 80 MG capsule Take 1 capsule (80 mg) by mouth 2 times daily (with meals). 180 capsule 1  No Known Allergies    Review of Systems:  Skin:  Otherwise negative  Objective:     BP 136/83 (BP Location: Right arm, BP Patient Position: Sitting, BP cuff size: Regular)   Pulse 80   Temp 98.9 F (37.2 C) (Temporal)   Wt 120 kg (264 lb 9.6 oz)   LMP  (LMP Unknown)   BMI 42.71 kg/m     General: Pleasant, in no acute distress, appears stated age  Psychiatric: Mood and affect are within normal limits. Alert and oriented x 3  Skin Exam:  Skin Type: II  Full body skin exam performed including scalp, face, conjunctiva, lips, neck, chest, back, abdomen,  right upper, left upper, right lower, left lower extremity, hands, nails, and the following was noted:    Erythematous papules and background erythema with maceration in inframammary area b/l and involvement of left abdomen under folds  Xerosis of b/l LE and feet  Left dorsal hand with verrucous skin colored papule  Multiple brown regular macules with moth-eaten borders on dorsal forearms, face, chest  Multiple stuck on appearing waxy tan to brown papules and plaques on torso    Assessment/Plan     # Verruca vulgaris, left hand  # Pruritus  - Counseled of viral etiology of these lesions and that these are often recalcitrant, particularly periungal lesions.  - Treatment options include LN2  - OTC salicyclic acid drops or pads 17%  - LN2 x3 cycles. R/B/A of LN2 discussed which include but are not limited to scarring, dyspigmentation and infection. Pt understands these risks and would like to proceed with therapy.    # Seborrheic keratoses  - Clinically without concerning features on exam today but the patient is to return to clinic for any changing skin lesions or skin concerns    # Solar lentigines  - Counseled that these are related to actinic damage  - Photoprotection and sunscreens discussed  - Clinically without concerning features on exam today but the patient is to return to clinic for any changing skin lesions or skin concerns    #Candidal intertrigo  - Continue nystatin powder, she prefers powder  - Discussed keeping area clean and dry     # Xerosis  - Continue ammonium lactate lotion     The above plan of care, diagnosis, orders, and follow-up were discussed with the patient.  Questions related to this recommended plan of care were answered.    RTC 12 months or sooner prn     Mee Hives, MD

## 2019-05-26 NOTE — Telephone Encounter (Signed)
Patient called for an update on referral for obgyn at [facility] please advise.

## 2019-05-27 NOTE — Telephone Encounter (Signed)
Referral tee-d up.  Rosie, please let pt know.  Can call [facility] in a week or two to schedule:  [phone].  Thank you.

## 2019-05-27 NOTE — Telephone Encounter (Signed)
05/27/19 4:24pm called patient relayed message to patient. Patient verbalized understanding. raranda  MA

## 2019-05-27 NOTE — Telephone Encounter (Signed)
appt schedule for [place] Thursday, auth submitted

## 2019-05-29 ENCOUNTER — Encounter: Payer: Self-pay | Admitting: Urology

## 2019-05-29 ENCOUNTER — Ambulatory Visit: Payer: Medicare (Managed Care) | Admitting: Internal Medicine

## 2019-05-29 ENCOUNTER — Telehealth: Payer: Medicare (Managed Care) | Admitting: Urology

## 2019-05-29 DIAGNOSIS — N3941 Urge incontinence: Secondary | ICD-10-CM

## 2019-05-29 MED ORDER — MIRABEGRON ER 50 MG PO TB24
50.0000 mg | ORAL_TABLET | Freq: Every day | ORAL | 3 refills | Status: DC
Start: 2019-05-29 — End: 2019-09-01

## 2019-05-29 NOTE — Progress Notes (Signed)
Due to COVID-19 pandemic and a federally declared state of public health emergency, this telemedicine visit was conducted Audio Only. Total time spent was 11-20 minutes.     Patient confirmed location in [place] and consented to telemedicine visit    Urology Follow Up Note    HISTORY OF PRESENT ILLNESS:  Julie Monroe is a 74 year old-year-old woman here for follow up. She has a history ofurgency urinary incontinence. Cystoscopy on12/16/19demonstrated no lesions. She has been treated withoxybutyninand with PTNS x 4 sessions.She was doing well for some time after the PTNS, but she stopped the treatments because she did not feel it was helping. Then her symptoms started to worsen again with urgency and urgency incontinence.She did not wish to proceed with third line therapy so we started dual medical therapy with myrbetriq and oxybutynin. She then stopped the myrbetriq because she was doing well without it.    Today she reports she stopped the oxybutynin and then started taking myrbetriq only. She found that she was doing better on the myrbetriq than the oxybutynin. She has normal blood pressure.        Current Outpatient Medications:   .  ammonium lactate (AMLACTIN) 12 % lotion, Apply 1 Application topically 2 times daily. Rub in to affected area well for dry skin, Disp: 1 bottle, Rfl: 11  .  ARIPiprazole (ABILIFY) 30 MG tablet, Take 1 tablet (30 mg) by mouth daily., Disp: 90 tablet, Rfl: 1  .  aspirin 81 MG tablet, Take 81 mg by mouth daily., Disp: , Rfl:   .  atorvastatin (LIPITOR) 10 MG tablet, Take 1 tablet (10 mg) by mouth daily., Disp: 90 tablet, Rfl: 3  .  BIOTIN PO, Take 1 capsule by mouth daily., Disp: , Rfl:   .  Biotin w/ Vitamins C & E (HAIR/SKIN/NAILS PO), , Disp: , Rfl:   .  buPROPion (WELLBUTRIN XL) 150 MG XL tablet, Take 1 tablet (150 mg) by mouth every morning., Disp: 30 tablet, Rfl: 0  .  Calcium Carbonate 600 MG tablet, Take 600 mg by mouth 2 times daily., Disp: , Rfl:   .  Calcium Carbonate-Vit  D-Min (CALCIUM 1200 PO), Take 1 tablet by mouth daily., Disp: , Rfl:   .  celecoxib (CELEBREX) 100 MG capsule, Take 1 capsule (100 mg) by mouth 2 times daily., Disp: 60 capsule, Rfl: 1  .  Cholecalciferol (VITAMIN D3) 50 MCG (2000 UT) capsule, Take 1 capsule by mouth daily., Disp: , Rfl:   .  Clobetasol Propionate (IMPOYZ) 0.025 % CREA, Apply topically., Disp: , Rfl:   .  desvenlafaxine (PRISTIQ) 50 MG TB24, Take 1 tablet (50 mg) by mouth daily., Disp: 90 tablet, Rfl: 1  .  econazole (SPECTAZOLE) 1 % cream, Apply 1 Application topically 2 times daily. Use a small amount as directed, Disp: 1 Tube, Rfl: 1  .  fluPHENAZine (PROLIXIN) 10 MG tablet, Take 1 tablet (10 mg) by mouth daily., Disp: 30 tablet, Rfl: 1  .  gabapentin (NEURONTIN) 600 MG tablet, Take 2 tablets (1,200 mg) by mouth at bedtime., Disp: 180 tablet, Rfl: 3  .  levothyroxine (SYNTHROID) 137 MCG tablet, TK 1 T PO D, Disp: , Rfl:   .  mirabegron (MYRBETRIQ) 50 MG ER tablet, Take 50 mg by mouth daily., Disp: 90 tablet, Rfl: 3  .  nystatin (MYCOSTATIN) 100000 UNIT/GM powder, Apply 1 Application topically 3 times daily. Apply to affected area, Disp: 60 g, Rfl: 3  .  orlistat (XENICAL) 120 MG capsule,  Take 1 capsule (120 mg) by mouth 3 times daily (with meals)., Disp: 90 capsule, Rfl: 3  .  oxybutynin (DITROPAN XL) 10 MG Controlled-Release tablet, Take 1 tablet (10 mg) by mouth daily., Disp: 90 tablet, Rfl: 3  .  sertraline (ZOLOFT) 100 MG tablet, Take 100 mg by mouth daily., Disp: , Rfl:   .  triamcinolone (KENALOG) 0.1 % ointment, Use twice a day as needed for hand eczema, Disp: 80 g, Rfl: 2  .  trihexyphenidyl (ARTANE) 2 MG tablet, Take 0.5 tablets (1 mg) by mouth 3 times daily. (Patient taking differently: Take 1 mg by mouth. 1 tablet in the morning and 1.5 tablet at bedtime), Disp: 135 tablet, Rfl: 1  .  ziprasidone (GEODON) 80 MG capsule, Take 1 capsule (80 mg) by mouth 2 times daily (with meals)., Disp: 180 capsule, Rfl: 1    Past Medical History:      Diagnosis Date   . Bipolar affect, depressed (CMS-HCC)    . Chronic back pain    . Hypothyroidism    . Insomnia    . OAB (overactive bladder)    . OCD (obsessive compulsive disorder)    . Osteopenia    . Pure hypercholesterolemia 10/10/2018   . Schizophrenia (CMS-HCC)        No Known Allergies      ASSESSMENT:  Julie Monroe is a 74 year old woman with urgency urinary incontinence due to overactive bladder.Marland Kitchen      PLAN:  Continue with myrbetriq. Reviewed possible side effects and she is aware it can raise blood pressure.  We discussed that away be is a chronic condition and she can expect the medication to be continued for as long as she seeks symptom relief.  She is currently happy with her bladder function.  I will see her back in 1 year, sooner if needed.

## 2019-05-29 NOTE — Telephone Encounter (Signed)
Patients husband would like to make sure the referral for the appointment for today is approved. Check in with [facility] location and they stated the referral is approved and good to go.   Thank you

## 2019-06-02 ENCOUNTER — Telehealth: Payer: Self-pay | Admitting: Internal Medicine

## 2019-06-02 NOTE — Telephone Encounter (Signed)
Spoke with patient and offered her 11:00 tomorrow appt with Dr. [name] for back pain and she accepted.  ER precautions reviewed and verbalized understanding.  Patient states she was hoping for an appt on Friday with Dr. [name], but explained Dr. [name] not in the clinic on Fridays.   Appt booked. Appreciative.

## 2019-06-02 NOTE — Telephone Encounter (Signed)
Pt has an appt. In June for a Physical, pt would like an appt. Sooner to be seen for lower back pain, no appt. Until June.  Please assist  Thank you

## 2019-06-03 ENCOUNTER — Ambulatory Visit: Payer: Medicare (Managed Care) | Admitting: Internal Medicine

## 2019-06-06 ENCOUNTER — Telehealth: Payer: Self-pay | Admitting: Psychiatry

## 2019-06-06 ENCOUNTER — Encounter: Payer: Self-pay | Admitting: Psychiatry

## 2019-06-08 ENCOUNTER — Encounter: Payer: Self-pay | Admitting: Psychiatry

## 2019-06-09 ENCOUNTER — Telehealth: Payer: Self-pay | Admitting: Psychiatry

## 2019-06-09 ENCOUNTER — Encounter: Payer: Self-pay | Admitting: Psychiatry

## 2019-06-09 DIAGNOSIS — F250 Schizoaffective disorder, bipolar type: Secondary | ICD-10-CM

## 2019-06-09 DIAGNOSIS — F419 Anxiety disorder, unspecified: Secondary | ICD-10-CM

## 2019-06-09 MED ORDER — TRIHEXYPHENIDYL HCL 2 MG OR TABS
1.0000 mg | ORAL_TABLET | Freq: Four times a day (QID) | ORAL | 3 refills | Status: DC
Start: 2019-06-09 — End: 2019-07-15

## 2019-06-09 MED ORDER — FLUPHENAZINE HCL 10 MG OR TABS
10.0000 mg | ORAL_TABLET | Freq: Every day | ORAL | 3 refills | Status: DC
Start: 2019-06-09 — End: 2019-07-15

## 2019-06-09 NOTE — Telephone Encounter (Signed)
Provider attempted to contact pt by phone x 2, with no answer. Plan to coordinate care by MyChart and/or arranging an additional call.

## 2019-06-10 ENCOUNTER — Encounter: Payer: Self-pay | Admitting: Psychiatry

## 2019-06-10 ENCOUNTER — Ambulatory Visit: Payer: Self-pay | Admitting: Psychiatry

## 2019-06-11 ENCOUNTER — Ambulatory Visit: Payer: Self-pay | Admitting: Dermatology

## 2019-06-11 ENCOUNTER — Encounter: Payer: Self-pay | Admitting: Ob/Gyn

## 2019-06-11 ENCOUNTER — Encounter: Payer: Self-pay | Admitting: Psychiatry

## 2019-06-11 ENCOUNTER — Ambulatory Visit (INDEPENDENT_AMBULATORY_CARE_PROVIDER_SITE_OTHER): Payer: Medicare (Managed Care) | Admitting: Ob/Gyn

## 2019-06-11 VITALS — BP 133/64 | HR 78 | Temp 97.7°F | Wt 269.0 lb

## 2019-06-11 DIAGNOSIS — Z01419 Encounter for gynecological examination (general) (routine) without abnormal findings: Secondary | ICD-10-CM

## 2019-06-11 NOTE — Progress Notes (Signed)
[id number]   Julie Monroe   [facility]  06/11/2019   Gender: female  Current Location: [facility]  DOB: 1945-08-07      OB/GYN      Amb Note, Gynecology New Patient  Clinician Documentation:    Alberteen Sam, M.D,  [facility]   OB/[facility]N    Chief Complaint: WWE    History of Present Illness:  Julie Monroe is a 74 year old female, G4P3  LMP: No LMP recorded (lmp unknown). Patient is postmenopausal.  11/20 birads 2  No bleeding  No discharge/ itching  States has  " wart" for a long time, feels it might be getting bigger  No breast issues  H/o oab on meds        Medications:    Current Outpatient Medications:   .  ammonium lactate (AMLACTIN) 12 % lotion, Apply 1 Application topically 2 times daily. Rub in to affected area well for dry skin, Disp: 1 bottle, Rfl: 11  .  ARIPiprazole (ABILIFY) 30 MG tablet, Take 1 tablet (30 mg) by mouth daily., Disp: 90 tablet, Rfl: 1  .  aspirin 81 MG tablet, Take 81 mg by mouth daily., Disp: , Rfl:   .  atorvastatin (LIPITOR) 10 MG tablet, Take 1 tablet (10 mg) by mouth daily., Disp: 90 tablet, Rfl: 3  .  BIOTIN PO, Take 1 capsule by mouth daily., Disp: , Rfl:   .  Biotin w/ Vitamins C & E (HAIR/SKIN/NAILS PO), , Disp: , Rfl:   .  buPROPion (WELLBUTRIN XL) 150 MG XL tablet, Take 1 tablet (150 mg) by mouth every morning., Disp: 30 tablet, Rfl: 0  .  Calcium Carbonate 600 MG tablet, Take 600 mg by mouth 2 times daily., Disp: , Rfl:   .  Calcium Carbonate-Vit D-Min (CALCIUM 1200 PO), Take 1 tablet by mouth daily., Disp: , Rfl:   .  celecoxib (CELEBREX) 100 MG capsule, Take 1 capsule (100 mg) by mouth 2 times daily., Disp: 60 capsule, Rfl: 1  .  Cholecalciferol (VITAMIN D3) 50 MCG (2000 UT) capsule, Take 1 capsule by mouth daily., Disp: , Rfl:   .  Clobetasol Propionate (IMPOYZ) 0.025 % CREA, Apply topically., Disp: , Rfl:   .  desvenlafaxine (PRISTIQ) 50 MG TB24, Take 1 tablet (50 mg) by mouth daily., Disp: 90 tablet, Rfl: 1  .  econazole (SPECTAZOLE) 1 % cream,  Apply 1 Application topically 2 times daily. Use a small amount as directed, Disp: 1 Tube, Rfl: 1  .  fluPHENAZine (PROLIXIN) 10 MG tablet, Take 1 tablet (10 mg) by mouth daily., Disp: 30 tablet, Rfl: 3  .  gabapentin (NEURONTIN) 600 MG tablet, Take 2 tablets (1,200 mg) by mouth at bedtime., Disp: 180 tablet, Rfl: 3  .  levothyroxine (SYNTHROID) 137 MCG tablet, TK 1 T PO D, Disp: , Rfl:   .  mirabegron (MYRBETRIQ) 50 MG ER tablet, Take 50 mg by mouth daily., Disp: 90 tablet, Rfl: 3  .  nystatin (MYCOSTATIN) 100000 UNIT/GM powder, Apply 1 Application topically 3 times daily. Apply to affected area, Disp: 60 g, Rfl: 3  .  orlistat (XENICAL) 120 MG capsule, Take 1 capsule (120 mg) by mouth 3 times daily (with meals)., Disp: 90 capsule, Rfl: 3  .  oxybutynin (DITROPAN XL) 10 MG Controlled-Release tablet, Take 1 tablet (10 mg) by mouth daily., Disp: 90 tablet, Rfl: 3  .  sertraline (ZOLOFT) 100 MG tablet, Take 100 mg by mouth daily., Disp: , Rfl:   .  triamcinolone (KENALOG) 0.1 % ointment, Use twice a day as needed for hand eczema, Disp: 80 g, Rfl: 2  .  trihexyphenidyl (ARTANE) 2 MG tablet, Take 0.5 tablets (1 mg) by mouth 4 times daily., Disp: 60 tablet, Rfl: 3  .  ziprasidone (GEODON) 80 MG capsule, Take 1 capsule (80 mg) by mouth 2 times daily (with meals)., Disp: 180 capsule, Rfl: 1    Allergies:  No Known Allergies    Gyn/Ob History:  Gyn History            LMP LMP Unknown, Postmenopausal    Age at Menarche     Age at First Pregnancy     Age at Menopause     Gyn History Comments TAH 1998    Sexual Activity Never; No partner data on record    Contraception No contraception data on record         OB History   Gravida Para Term Preterm AB Living   4 3 0 0 0 0   SAB TAB Ectopic Multiple Live Births   0 0 0 0 0        Past Medical History:   Past Medical History:   Diagnosis Date   . Bipolar affect, depressed (CMS-HCC)    . Chronic back pain    . Hypothyroidism    . Insomnia    . OAB (overactive bladder)    . OCD  (obsessive compulsive disorder)    . Osteopenia    . Pure hypercholesterolemia 10/10/2018   . Schizophrenia (CMS-HCC)         Past Surgical History:  Past Surgical History:   Procedure Laterality Date   . Biilat. Knee replacement     . Rt toe surgery     . TOTAL KNEE ARTHROPLASTY Bilateral         Social History:  Social History     Socioeconomic History   . Marital status: Married     Spouse name: Not on file   . Number of children: Not on file   . Years of education: Not on file   . Highest education level: Not on file   Occupational History   . Not on file   Social Needs   . Financial resource strain: Not on file   . Food insecurity     Worry: Not on file     Inability: Not on file   . Transportation needs     Medical: Not on file     Non-medical: Not on file   Tobacco Use   . Smoking status: Former Smoker     Packs/day: 2.00     Years: 30.00     Pack years: 60.00   . Smokeless tobacco: Never Used   Substance and Sexual Activity   . Alcohol use: No     Frequency: Never   . Drug use: No   . Sexual activity: Never   Lifestyle   . Physical activity     Days per week: Not on file     Minutes per session: Not on file   . Stress: Not on file   Relationships   . Social Wellsite geologist on phone: Not on file     Gets together: Not on file     Attends religious service: Not on file     Active member of club or organization: Not on file     Attends meetings of clubs or organizations: Not on file  Relationship status: Not on file   . Intimate partner violence     Fear of current or ex partner: Not on file     Emotionally abused: Not on file     Physically abused: Not on file     Forced sexual activity: Not on file   Other Topics Concern   . Not on file   Social History Narrative   . Not on file       Family History:  Family History   Problem Relation Name Age of Onset   . Other Mother          Wegener   . Cancer Brother          Leukemia   . Cancer Father          Leukemia   . Heart Disease Father     . Hypertension  Father     . No Known Problems Sister     . Cancer Brother          Bladder Cancer       Review of Systems:  . Constitutional:  Denies fever, chills, weight loss, weight gain  . Head & Neck:  Denies headache, vision change, hearing change, sore throat  . Respiratory:  Denies shortness of breath, cough  . Cardiovascular:  Denies chest pain/pressure, orthopnea, PND, palpitations  . Gastrointestinal:  Denies abdominal pain, nausea, vomiting, diarrhea, or rectal bleeding  . Genitourinary:  Denies dysuria, polyuria, hesitancy, frequency  . Musculoskeletal:  Denies joint pain, leg edema  . Neurological:  Denies  focal numbness, weakness  . All other Review of Systems is negative   .   Physical Exam:   Vital Signs:  BP 133/64 (BP Location: Left arm, BP Patient Position: Sitting)   Pulse 78   Temp 97.7 F (36.5 C) (Temporal)   Wt 122 kg (268 lb 15.4 oz)   LMP  (LMP Unknown)   BMI 43.41 kg/m     General: Female, well developed. No apparent distress.    Eyes: Normal conjunctiva.   ENT: No external lesions. Neck is supple. No masses.   Resp: Normal respiratory effort.   CV: No LE edema.    Breast:  bilateral symmetric, no masses or tenderness, no axillary masses or lymph nodes.  No skin changes or nipple discharge.   GI: Abdomen soft nontender non-distended, no hsm   GU/Pelvic: noted epidermal cyst on right labia majora, no sx/ sx infection   BUS: no lesions, masses or tenderness   CX/VAG: no discharge, lesions or odor   Biman: normal uterus size and contour, no cmt.  No adnexal masses or tenderness    Musculoskeletal: Normal gait and station. Normal range of motion.    Skin: No rash, lesions, ulcers.    Extremities:   no edema   Psych: Normal judgement/ insight. Awake and oriented x 3.     Diagnostic Data:    Assessment:  Julie Jessee Disheris a 74 year old  here for NEW WWE    Plan:     Annual mammogram ordered.  Recommended as a screening test for breast cancer. SBE done and demonstrated.  Discussed  the new information that will be given to her regarding her mammogram results if she has dense breast. She was informed to call our office if she has any questions or wants to discuss further screening test, i.e. ultrasound or mri. , SBE demonstrated and recommended monthly and Dietary calcium, Vit D and weight bearing exercises encouraged    -  STD prevention    No pap per asccp guidelines no pap needed for asymptomatic and low risk    f/u pmd for labs and other routine screening and immunizations    Discussed colonoscopy, recommended for colon cancer screening, referral given or follow up with pmd for referral    -Healthy diet and exercise    - Follow up as needed      Shelly Bombard, MD  [employer] Associate Clinical Professor  [facility]   Dept of Ob/Gyn

## 2019-06-12 NOTE — Telephone Encounter (Signed)
Julie Monroe called to follow up on My Chart message from 4/27 regarding request for medication Ariprazole 15mg 

## 2019-06-13 ENCOUNTER — Telehealth: Payer: Self-pay | Admitting: Psychiatry

## 2019-06-13 ENCOUNTER — Encounter: Payer: Self-pay | Admitting: Psychiatry

## 2019-06-13 MED ORDER — ARIPIPRAZOLE 15 MG OR TABS
15.0000 mg | ORAL_TABLET | Freq: Two times a day (BID) | ORAL | 0 refills | Status: DC
Start: 2019-06-13 — End: 2019-07-15

## 2019-06-13 NOTE — Telephone Encounter (Signed)
Pt called requesting refill for ARIPiprazole (ABILIFY) stating that last time Dr. Willaim Bane mentioned doing 15 mg twice a day instead of 30 mg once a day and also wanted clinical team to let Dr.Preda know that Dr.Park did not respond to her mychart message regarding the medication

## 2019-06-14 MED ORDER — ARIPIPRAZOLE 30 MG OR TABS
30.0000 mg | ORAL_TABLET | Freq: Every day | ORAL | 1 refills | Status: DC
Start: 2019-06-14 — End: 2019-07-15

## 2019-06-16 ENCOUNTER — Encounter: Payer: Self-pay | Admitting: Psychiatry

## 2019-06-16 NOTE — Telephone Encounter (Signed)
30mg  prescription sent on 05/01

## 2019-06-18 ENCOUNTER — Encounter: Payer: Self-pay | Admitting: Psychiatry

## 2019-06-19 ENCOUNTER — Other Ambulatory Visit: Payer: Self-pay | Admitting: Student in an Organized Health Care Education/Training Program

## 2019-06-19 ENCOUNTER — Other Ambulatory Visit: Payer: Self-pay | Admitting: Psychiatry

## 2019-06-19 MED ORDER — BUPROPION XL (DAILY) 150 MG OR TB24
ORAL_TABLET | ORAL | 0 refills | Status: DC
Start: 2019-06-19 — End: 2019-07-15

## 2019-06-19 NOTE — Telephone Encounter (Signed)
Sent refill request to Dr. Park

## 2019-06-19 NOTE — Telephone Encounter (Signed)
Julie Monroe and her husband Arlys John called for refill status of BuPropion/Wellbutrin XL 150mg  stating she only has enough to last through the weekend. I advised them it was routed to the Dr. at  11:42 this morning.

## 2019-06-23 ENCOUNTER — Encounter: Payer: Self-pay | Admitting: Dermatology

## 2019-06-23 ENCOUNTER — Ambulatory Visit (INDEPENDENT_AMBULATORY_CARE_PROVIDER_SITE_OTHER): Payer: Medicare (Managed Care) | Admitting: Dermatology

## 2019-06-23 ENCOUNTER — Telehealth: Payer: Self-pay | Admitting: Internal Medicine

## 2019-06-23 ENCOUNTER — Telehealth: Payer: Self-pay | Admitting: Dermatology

## 2019-06-23 VITALS — BP 121/77 | HR 67 | Temp 97.3°F | Wt 268.4 lb

## 2019-06-23 DIAGNOSIS — D692 Other nonthrombocytopenic purpura: Secondary | ICD-10-CM

## 2019-06-23 DIAGNOSIS — L578 Other skin changes due to chronic exposure to nonionizing radiation: Secondary | ICD-10-CM

## 2019-06-23 MED ORDER — BUPROPION XL (DAILY) 150 MG OR TB24
150.0000 mg | ORAL_TABLET | Freq: Every morning | ORAL | 0 refills | Status: DC
Start: 2019-06-23 — End: 2019-07-15

## 2019-06-23 NOTE — Telephone Encounter (Signed)
Pt is calling to follow up on previous message below. Contacted urgent iPhone, transferred to Ron.

## 2019-06-23 NOTE — Telephone Encounter (Signed)
Hi Dr. Dorma Russell,     I spoke with pt, and she stated that she has a rash on both of her arms. She would like to be seen. I offer her an appointment for Friday at [facility] since you had an opening but she does not want to wait until Friday. She would like to be seen as soon as possible.     She ask if it would be possible for her to be seen at [facility] today.    Please advise

## 2019-06-23 NOTE — Progress Notes (Signed)
[facility]  [email or web address]  Phone: [phone]   Fax: [phone]  PATIENT: Julie Monroe  MRN: [id number]  DOB: 09/24/1945  DATE OF SERVICE: 06/23/2019    REFERRING PRACTITIONER: Self, Referred  PRIMARY CARE PROVIDER: Leonia Reader  CHIEF COMPLAINT: new lesions on arms    Subjective:   Julie Monroe is a 74 year old female with history of schizoaffective disorder who was seen in clinic for new concerns today of spots on right and left arm that resulted after significant sun exposure over the weekend. Was itchy at first, now asymptomatic. No treatment tried. No other skin involvement.    Here with her husband. No other skin concerns.     No personal history of skin cancer  No family history of melanoma    Past Medical History:   Diagnosis Date   . Bipolar affect, depressed (CMS-HCC)    . Chronic back pain    . Hypothyroidism    . Insomnia    . OAB (overactive bladder)    . OCD (obsessive compulsive disorder)    . Osteopenia    . Pure hypercholesterolemia 10/10/2018   . Schizophrenia (CMS-HCC)      Past Surgical History:   Procedure Laterality Date   . Biilat. Knee replacement     . Rt toe surgery     . TOTAL KNEE ARTHROPLASTY Bilateral      Family History   Problem Relation Name Age of Onset   . Other Mother          Wegener   . Cancer Brother          Leukemia   . Cancer Father          Leukemia   . Heart Disease Father     . Hypertension Father     . No Known Problems Sister     . Cancer Brother          Bladder Cancer     Social History     Socioeconomic History   . Marital status: Married     Spouse name: Not on file   . Number of children: Not on file   . Years of education: Not on file   . Highest education level: Not on file   Occupational History   . Not on file   Social Needs   . Financial resource strain: Not on file   . Food insecurity     Worry: Not on file     Inability: Not on file   . Transportation needs     Medical: Not on file     Non-medical: Not on file   Tobacco Use   .  Smoking status: Former Smoker     Packs/day: 2.00     Years: 30.00     Pack years: 60.00   . Smokeless tobacco: Never Used   Substance and Sexual Activity   . Alcohol use: No     Frequency: Never   . Drug use: No   . Sexual activity: Never   Lifestyle   . Physical activity     Days per week: Not on file     Minutes per session: Not on file   . Stress: Not on file   Relationships   . Social Wellsite geologist on phone: Not on file     Gets together: Not on file     Attends religious service: Not on file     Active member of club  or organization: Not on file     Attends meetings of clubs or organizations: Not on file     Relationship status: Not on file   . Intimate partner violence     Fear of current or ex partner: Not on file     Emotionally abused: Not on file     Physically abused: Not on file     Forced sexual activity: Not on file   Other Topics Concern   . Not on file   Social History Narrative   . Not on file     Outpatient Medications Marked as Taking for the 06/23/19 encounter (Office Visit) with Karma Greaser, MD   Medication Sig Dispense Refill   . ammonium lactate (AMLACTIN) 12 % lotion Apply 1 Application topically 2 times daily. Rub in to affected area well for dry skin 1 bottle 11   . ARIPiprazole (ABILIFY) 30 MG tablet Take 1 tablet (30 mg) by mouth daily. 90 tablet 1   . aspirin 81 MG tablet Take 81 mg by mouth daily.     Marland Kitchen atorvastatin (LIPITOR) 10 MG tablet Take 1 tablet (10 mg) by mouth daily. 90 tablet 3   . buPROPion (WELLBUTRIN XL) 150 MG XL tablet Take 1 tablet (150 mg) by mouth every morning. 30 tablet 0   . buPROPion (WELLBUTRIN XL) 150 MG XL tablet TAKE 1 TABLET(150 MG) BY MOUTH EVERY MORNING 30 tablet 0   . Calcium Carbonate 600 MG tablet Take 600 mg by mouth 2 times daily.     Marland Kitchen desvenlafaxine (PRISTIQ) 50 MG TB24 Take 1 tablet (50 mg) by mouth daily. 90 tablet 1   . fluPHENAZine (PROLIXIN) 10 MG tablet Take 1 tablet (10 mg) by mouth daily. 30 tablet 3   . gabapentin  (NEURONTIN) 600 MG tablet Take 2 tablets (1,200 mg) by mouth at bedtime. 180 tablet 3   . levothyroxine (SYNTHROID) 137 MCG tablet TK 1 T PO D     . mirabegron (MYRBETRIQ) 50 MG ER tablet Take 50 mg by mouth daily. 90 tablet 3   . nystatin (MYCOSTATIN) 100000 UNIT/GM powder Apply 1 Application topically 3 times daily. Apply to affected area 60 g 3   . oxybutynin (DITROPAN XL) 10 MG Controlled-Release tablet Take 1 tablet (10 mg) by mouth daily. 90 tablet 3   . sertraline (ZOLOFT) 100 MG tablet Take 100 mg by mouth daily.     Marland Kitchen triamcinolone (KENALOG) 0.1 % ointment Use twice a day as needed for hand eczema 80 g 2   . trihexyphenidyl (ARTANE) 2 MG tablet Take 0.5 tablets (1 mg) by mouth 4 times daily. 60 tablet 3   . ziprasidone (GEODON) 80 MG capsule Take 1 capsule (80 mg) by mouth 2 times daily (with meals). 180 capsule 1     No Known Allergies    Review of Systems:  Skin:  Otherwise negative  Objective:     BP 121/77 (BP Location: Right arm, BP Patient Position: Sitting, BP cuff size: Large)   Pulse 67   Temp 97.3 F (36.3 C)   Wt 121.7 kg (268 lb 6.4 oz)   LMP  (LMP Unknown)   BMI 43.32 kg/m     General: Pleasant, in no acute distress, appears stated age  Psychiatric: Mood and affect are within normal limits. Alert and oriented x 3  Skin Exam:  Skin Type: II  Skin exam performed including scalp, face, conjunctiva, right upper, left upper extremity, hands, nails, and the following  was noted:    Dorsal forearms tan with scattered purpuric macules and ecchymoses on right dorsal forearm and fewer on left  No eczematous dermatitis or other dermatitis     Assessment/Plan     # Solar purpura   # Actinic damage  - Discussed diagnosis and etiology and that this is common following inflammation and or trauma to the area, no concerning features on exam  - Continue ammonium lactate lotion  - Discussed will improve with time  - Discussed importance of avoiding sun exposure and practice sun protection    The above plan  of care, diagnosis, orders, and follow-up were discussed with the patient.  Questions related to this recommended plan of care were answered.    Marcelle Overlie, MD

## 2019-06-23 NOTE — Telephone Encounter (Signed)
Patient states she has a a rash on her right arm that is spreading and asking for nurse advise or to be seen as soon as possible thank you

## 2019-06-23 NOTE — Telephone Encounter (Addendum)
Received call on triage phone. Verlon Au, RN at lunch. Pt states she has macular rash that is "red and purple" started yesterday on right upper arm and spread to left upper arm. Rash not noticed anywhere else.     Denies itching, pain or any other symptoms. Pt hasn't tried any interventions.     Pt sees Dr. Mee Hives Dermatologist. Abelardo Diesel, MA for Dr. Dorma Russell who will assist pt in scheduling a visit for pt in [facility].

## 2019-06-23 NOTE — Addendum Note (Signed)
Addended by: Durwin Nora on: 06/23/2019 03:59 PM     Modules accepted: Orders

## 2019-06-24 ENCOUNTER — Telehealth: Payer: Self-pay | Admitting: Internal Medicine

## 2019-06-24 NOTE — Telephone Encounter (Signed)
Patient was seen by Dr. Dorma Russell yesterday, [facility].

## 2019-06-24 NOTE — Telephone Encounter (Signed)
PA for her Aripiprazole was APPROVED

## 2019-06-25 ENCOUNTER — Ambulatory Visit: Payer: Medicare (Managed Care) | Admitting: Internal Medicine

## 2019-06-30 ENCOUNTER — Encounter: Payer: Self-pay | Admitting: Psychiatry

## 2019-07-01 ENCOUNTER — Other Ambulatory Visit (INDEPENDENT_AMBULATORY_CARE_PROVIDER_SITE_OTHER): Payer: Medicare (Managed Care)

## 2019-07-01 DIAGNOSIS — I350 Nonrheumatic aortic (valve) stenosis: Secondary | ICD-10-CM

## 2019-07-01 LAB — COMPLETE 2D ECHO
AO Mean Gradient: 24 mmHg
AO Peak Velocity: 3.1 m/s
Aortic Valve Area: 1.34 cm2
LA Volume Index: 37.9 ml/m2
LV Diastolic Diameter: 5.35 cm
LV Ejection Fraction: 61.7 %
LV Stroke Volume Index: 39.8 ml/m2
Mitral Valve Area: 4.06 cm2
TR Velocity: 2.59 m/s

## 2019-07-02 ENCOUNTER — Telehealth: Payer: Self-pay | Admitting: Urology

## 2019-07-02 NOTE — Telephone Encounter (Signed)
Pt called in to advise that she will add oxybutynin to her medication that she will betaken on top of taken mybetric. If you have questions please call patient back and further assist.

## 2019-07-07 ENCOUNTER — Telehealth: Payer: Self-pay | Admitting: Internal Medicine

## 2019-07-07 ENCOUNTER — Encounter: Payer: Self-pay | Admitting: Cardiovascular Disease

## 2019-07-07 ENCOUNTER — Other Ambulatory Visit: Payer: Self-pay | Admitting: Cardiovascular Disease

## 2019-07-07 ENCOUNTER — Ambulatory Visit: Payer: Medicare (Managed Care) | Attending: Ophthalmology | Admitting: Ophthalmology

## 2019-07-07 ENCOUNTER — Ambulatory Visit (INDEPENDENT_AMBULATORY_CARE_PROVIDER_SITE_OTHER): Payer: Medicare (Managed Care) | Admitting: Cardiovascular Disease

## 2019-07-07 ENCOUNTER — Encounter: Payer: Self-pay | Admitting: Ophthalmology

## 2019-07-07 VITALS — BP 134/60 | HR 74 | Temp 97.1°F | Ht 66.0 in | Wt 270.2 lb

## 2019-07-07 DIAGNOSIS — E782 Mixed hyperlipidemia: Secondary | ICD-10-CM

## 2019-07-07 DIAGNOSIS — H35369 Drusen (degenerative) of macula, unspecified eye: Secondary | ICD-10-CM | POA: Insufficient documentation

## 2019-07-07 DIAGNOSIS — I4510 Unspecified right bundle-branch block: Secondary | ICD-10-CM

## 2019-07-07 DIAGNOSIS — H02886 Meibomian gland dysfunction of left eye, unspecified eyelid: Secondary | ICD-10-CM | POA: Insufficient documentation

## 2019-07-07 DIAGNOSIS — I350 Nonrheumatic aortic (valve) stenosis: Secondary | ICD-10-CM

## 2019-07-07 DIAGNOSIS — H2513 Age-related nuclear cataract, bilateral: Secondary | ICD-10-CM | POA: Insufficient documentation

## 2019-07-07 DIAGNOSIS — H02883 Meibomian gland dysfunction of right eye, unspecified eyelid: Secondary | ICD-10-CM | POA: Insufficient documentation

## 2019-07-07 DIAGNOSIS — H269 Unspecified cataract: Secondary | ICD-10-CM

## 2019-07-07 DIAGNOSIS — M3501 Sicca syndrome with keratoconjunctivitis: Secondary | ICD-10-CM | POA: Insufficient documentation

## 2019-07-07 MED ORDER — ATORVASTATIN CALCIUM 40 MG OR TABS
40.0000 mg | ORAL_TABLET | Freq: Every day | ORAL | 3 refills | Status: DC
Start: 2019-07-07 — End: 2020-01-29

## 2019-07-07 MED ORDER — LISINOPRIL 2.5 MG OR TABS
2.5000 mg | ORAL_TABLET | Freq: Every day | ORAL | 0 refills | Status: DC
Start: 2019-07-07 — End: 2019-07-08

## 2019-07-07 NOTE — Telephone Encounter (Signed)
Patient urgently requests to replace generic celebrex 100 mg.  States it does not work on lower back pain.    Ask to discuss with MD or RN. First available telemedicine visit was 07 26 2021. Seeks call back soon.    Okay to leave a voice message.  Thank you.

## 2019-07-07 NOTE — Telephone Encounter (Signed)
Rx loaded for MD to approve electronically.    Last seen in clinic:     Future appointment:

## 2019-07-07 NOTE — Progress Notes (Signed)
ICD-10-CM ICD-9-CM   1. Cataract, unspecified cataract type, unspecified laterality  H26.9 366.9   2. KCS (keratoconjunctivitis sicca) (CMS-HCC)  M35.01 710.2   3. Meibomian gland dysfunction (MGD) of both eyes  H02.883 374.89    H02.886    4. Drusen  H35.369 362.50       Catarct  Mild symptomatic  Early VS    The nature of cataract and its functional impairment was discussed with the patient including the elective nature of the surgery. The patient has ocular surface irregularities that would benefit from optimizing prior to considering surgery.  The patient was reassured that, at this time, deferring surgery to a later date does not put the patient at risk for higher complications. I emphasized that the need for surgery is primarily an issue of visual function and quality of life. All questions were answered. The patient will continue to monitor vision and contact us for earlier follow up if functional impairment persists or worsens. Otherwise, patient will return as scheduled for follow up.       KCS/MGD  Under-treated  Add PFAT  Add LH  Add omega3FA      Drusen  Very mild OU  Macular contour intact      F/u one year    I reviewed and confirmed all history components, including the HPI; and any other elements performed by the technician.     Julie Monroe (Julie) Eben Burow MD  Associate Professor of Ophthalmology  Cornea, Cataract, and Refractive Surgery

## 2019-07-07 NOTE — Progress Notes (Signed)
CARDIOLOGY NEW PATIENT VIST    Referring: Charlotte Crumb    HPI:  Julie Monroe is a 74 year old female here for a cardiovascular evaluation.  Patient has history of heart murmur and RBBB.  She had echocardiogram 03/2018 which showed mild aortic valve stenosis.    She had chest pain 10/16 and was evaluated in [facility].  Workup was negative.  No stress test.  No recurrence of chest pain.  Symptoms were atypical.  Patient mentioned that her father had MI at age 1 and her older sister has atrial fibrillation.    Patient has limited activity due to sheltering due to covid    Patient completed vaccination for covid 05/2019  Past Medical History:  Past Medical History:   Diagnosis Date   . Bipolar affect, depressed (CMS-HCC)    . Chronic back pain    . Hypothyroidism    . Insomnia    . OAB (overactive bladder)    . OCD (obsessive compulsive disorder)    . Osteopenia    . Pure hypercholesterolemia 10/10/2018   . Schizophrenia (CMS-HCC)         Medications:    Current Outpatient Medications:   .  ammonium lactate (AMLACTIN) 12 % lotion, Apply 1 Application topically 2 times daily. Rub in to affected area well for dry skin, Disp: 1 bottle, Rfl: 11  .  ARIPiprazole (ABILIFY) 15 MG tablet, Take 1 tablet (15 mg) by mouth in the morning and at bedtime., Disp: 60 tablet, Rfl: 0  .  ARIPiprazole (ABILIFY) 30 MG tablet, Take 1 tablet (30 mg) by mouth daily., Disp: 90 tablet, Rfl: 1  .  aspirin 81 MG tablet, Take 81 mg by mouth daily., Disp: , Rfl:   .  atorvastatin (LIPITOR) 40 MG tablet, Take 1 tablet (40 mg) by mouth daily., Disp: 90 tablet, Rfl: 3  .  BIOTIN PO, Take 1 capsule by mouth daily., Disp: , Rfl:   .  Biotin w/ Vitamins C & E (HAIR/SKIN/NAILS PO), , Disp: , Rfl:   .  buPROPion (WELLBUTRIN XL) 150 MG XL tablet, Take 1 tablet (150 mg) by mouth every morning., Disp: 30 tablet, Rfl: 0  .  buPROPion (WELLBUTRIN XL) 150 MG XL tablet, TAKE 1 TABLET(150 MG) BY MOUTH EVERY MORNING, Disp: 30 tablet, Rfl: 0  .  Calcium  Carbonate 600 MG tablet, Take 600 mg by mouth 2 times daily., Disp: , Rfl:   .  Calcium Carbonate-Vit D-Min (CALCIUM 1200 PO), Take 1 tablet by mouth daily., Disp: , Rfl:   .  celecoxib (CELEBREX) 100 MG capsule, Take 1 capsule (100 mg) by mouth 2 times daily., Disp: 60 capsule, Rfl: 1  .  Cholecalciferol (VITAMIN D3) 50 MCG (2000 UT) capsule, Take 1 capsule by mouth daily., Disp: , Rfl:   .  Clobetasol Propionate (IMPOYZ) 0.025 % CREA, Apply topically., Disp: , Rfl:   .  desvenlafaxine (PRISTIQ) 50 MG TB24, Take 1 tablet (50 mg) by mouth daily., Disp: 90 tablet, Rfl: 1  .  econazole (SPECTAZOLE) 1 % cream, Apply 1 Application topically 2 times daily. Use a small amount as directed, Disp: 1 Tube, Rfl: 1  .  fluPHENAZine (PROLIXIN) 10 MG tablet, Take 1 tablet (10 mg) by mouth daily., Disp: 30 tablet, Rfl: 3  .  gabapentin (NEURONTIN) 600 MG tablet, Take 2 tablets (1,200 mg) by mouth at bedtime., Disp: 180 tablet, Rfl: 3  .  levothyroxine (SYNTHROID) 137 MCG tablet, TK 1 T PO D, Disp: ,  Rfl:   .  lisinopril (PRINIVIL, ZESTRIL) 2.5 MG tablet, TAKE 1 TABLET(2.5 MG) BY MOUTH DAILY, Disp: 90 tablet, Rfl: 3  .  mirabegron (MYRBETRIQ) 50 MG ER tablet, Take 50 mg by mouth daily., Disp: 90 tablet, Rfl: 3  .  nystatin (MYCOSTATIN) 100000 UNIT/GM powder, Apply 1 Application topically 3 times daily. Apply to affected area, Disp: 60 g, Rfl: 3  .  orlistat (XENICAL) 120 MG capsule, Take 1 capsule (120 mg) by mouth 3 times daily (with meals)., Disp: 90 capsule, Rfl: 3  .  oxybutynin (DITROPAN XL) 10 MG Controlled-Release tablet, Take 1 tablet (10 mg) by mouth daily., Disp: 90 tablet, Rfl: 3  .  sertraline (ZOLOFT) 100 MG tablet, Take 100 mg by mouth daily., Disp: , Rfl:   .  triamcinolone (KENALOG) 0.1 % ointment, Use twice a day as needed for hand eczema, Disp: 80 g, Rfl: 2  .  trihexyphenidyl (ARTANE) 2 MG tablet, Take 0.5 tablets (1 mg) by mouth 4 times daily., Disp: 60 tablet, Rfl: 3  .  ziprasidone (GEODON) 80 MG capsule, Take  1 capsule (80 mg) by mouth 2 times daily (with meals)., Disp: 180 capsule, Rfl: 1    Allergies:  No Known Allergies    Past Surgical History:  Past Surgical History:   Procedure Laterality Date   . Biilat. Knee replacement     . Rt toe surgery     . TOTAL KNEE ARTHROPLASTY Bilateral        Family History:  Family History   Problem Relation Name Age of Onset   . Other Mother          [place]egener   . Cancer Brother          Leukemia   . Cancer Father          Leukemia   . Heart Disease Father     . Hypertension Father     . No Known Problems Sister     . Cancer Brother          Bladder Cancer       Social History:  Social History     Socioeconomic History   . Marital status: Married     Spouse name: Not on file   . Number of children: Not on file   . Years of education: Not on file   . Highest education level: Not on file   Occupational History   . Not on file   Tobacco Use   . Smoking status: Former Smoker     Packs/day: 2.00     Years: 30.00     Pack years: 60.00   . Smokeless tobacco: Never Used   Substance and Sexual Activity   . Alcohol use: No   . Drug use: No   . Sexual activity: Never   Other Topics Concern   . Not on file   Social History Narrative   . Not on file     Social Determinants of Health     Financial Resource Strain:    . Difficulty of Paying Living Expenses:    Food Insecurity:    . Worried About Charity fundraiser in the Last Year:    . Arboriculturist in the Last Year:    Transportation Needs:    . Film/video editor (Medical):    Marland Kitchen Lack of Transportation (Non-Medical):    Physical Activity:    . Days of Exercise per Week:    .  Minutes of Exercise per Session:    Stress:    . Feeling of Stress :    Social Connections:    . Frequency of Communication with Friends and Family:    . Frequency of Social Gatherings with Friends and Family:    . Attends Religious Services:    . Active Member of Clubs or Organizations:    . Attends Archivist Meetings:    Marland Kitchen Marital Status:    Intimate Partner  Violence:    . Fear of Current or Ex-Partner:    . Emotionally Abused:    Marland Kitchen Physically Abused:    . Sexually Abused:        Review of Systems:  (+) See HPI  (-) HA, fever, rigors, chills, palpitations, dizziness, syncope, CP, jaw/arm pain/numbness/paraesthesias, SOB/DOE, cough, hemoptysis, extremity swelling, PND, abdominal pain, N&V, BRBPR, melena, dysuria, hematuria, cold/heat intolerance, tremor, balance difficulties     Physical Exam:   BP 134/60 (BP Location: Left arm, BP Patient Position: Sitting, BP cuff size: Large)   Pulse 74   Temp 97.1 F (36.2 C) (Temporal)   Ht 5' 6" (1.676 m)   Wt 122.6 kg (270 lb 3.2 oz)   LMP  (LMP Unknown)   BMI 43.61 kg/m     General Appearance: Alert, oriented, cooperative, no acute distress.  Obese  HEENT:  Normocephalic, atraumatic, PERRL, conjunctiva/corneas clear  Neck: Supple, non-tender, normal thyroid, no  carotid bruits or JVD  Lungs: Clear to auscultation bilaterally, no rales or rhonchi  Chest Wall: No tenderness or deformity  Heart: Regular rhythm, normal S1 and S2  2/6 early peaking systolic murmur at the base, no gallop or rub  Abdomen: Soft, non-tende, bowel sounds active, no masses, or organomegaly  Extremities: No cyanosis, clubbing or edema  Pulses: 2+ and symmetric all extremities  Skin: Skin color, texture, turgor normal, no rashes or lesions  Neurologic: CNII-XII intact, normal strength, otherwise grossly intact.    Cardiology Studies:    Echo 04/08/2018   Summary:   1. Normal left ventricular systolic function. The left ventricular ejection fraction is 58% by Biplane.   2. Mild aortic valve stenosis.   3. Mildly enlarged right ventricle.   4. Left atrium mildly dilated.     ECG INTERPRETATION     Preliminary   SINUS RHYTHM RIGHT BUNDLE BRANCH BLOCK [120+ ms QRS DURATION, UPRIGHT V1, 40+ ms S IN I/aVL/V4/V5/V6] LEFT ANTERIOR FASCICULAR BLOCK [QRS AXIS <= -45, QR IN I, RS IN II] VOLTAGE CRITERIA FOR LVH [MEETS CRITERIA IN ONE OF: R(aVL), S(V1),  R(V5), R(V5/V6)+S(V1)] ABNORMAL ECG UNCONFIRMED REPORT     Echo 07/01/2019   Summary:   1. Left atrium mildly dilated.   2. Normal left ventricular systolic function. The left ventricular ejection fraction is 62% by Biplane.   3. Mild to moderate aortic valve stenosis.   4. DI 0.38, AVA 1.2-1.3 cm2 by continuity, mean gradient 24 mmHg, AoV max velocity 3.1 m/s from the apical five chamber.   5. Moderate concentric left ventricular hypertrophy.   6. Left ventricular ejection fraction is 59% by 3D.   7. Moderate diastolic LV dysfunction.         Lab Review:   Lab Results   Component Value Date    K 4.5 11/26/2018    CL 105 11/26/2018    BUN 18 11/26/2018    CREAT 0.81 11/26/2018    GLU 90 11/26/2018    [place] 9.7 11/26/2018     Lab Results  Component Value Date    ALB 4.2 11/26/2018    ALK 101 11/26/2018    AST 15 11/26/2018    ALT 9 11/26/2018    TBILI 1.0 11/26/2018     Lab Results   Component Value Date    CHOL 182 11/26/2018    TRIG 66 11/26/2018    HDL 80 11/26/2018    LDL 87 11/26/2018     Lab Results   Component Value Date    WBCCOUNT 5.8 12/12/2018    HGB 13.5 12/12/2018    HCT 40.6 12/12/2018    MCV 87.6 12/12/2018    MCHC 33.2 12/12/2018     Lab Results   Component Value Date    TSH 0.86 11/26/2018    FREET4 1.3 11/26/2018    A1C 5.3 11/26/2018         Assessment and Plan:  Problem List Items Addressed This Visit        Cardiovascular    RBBB    Mild aortic valve stenosis - Primary    Relevant Orders    Complete 2D ECHO with Image Enhancement Agent if Necessary    Basic Metabolic Panel, Blood Green Plasma Separator Tube    Mixed hyperlipidemia        Patient has mild aortic valve stenosis.  I will repeat echocardiogram a year after her initial study to evaluate the progression of her valvular disease.  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  Monitor    Chest pain, resolved.  No further workup  necessary at this time    HLD on statins.  I will increase atorvastatin to 48m   IrAlvino ChapelMD

## 2019-07-08 MED ORDER — LISINOPRIL 2.5 MG OR TABS
ORAL_TABLET | ORAL | 3 refills | Status: DC
Start: 2019-07-08 — End: 2019-10-06

## 2019-07-08 NOTE — Telephone Encounter (Signed)
Please let her know that she and I haven't discussed her LBP in some time (several yrs, since last L-spineXR ordered by me -- but not done by pt -- was in 02/2018?).  Was she seeing pain mgmt for this?    Per Epic, Celebrex refill given last by DR. Bobak Koohian?  Would adivse f/u with this pain specialist for alternative treatment.     Thank you.

## 2019-07-08 NOTE — Telephone Encounter (Signed)
Spoke with patient who states she started the celebrex some time ago and then stopped, and then about a month or so she started up with it again, but it just doesn't help her back at all.  Doesn't want a narcotic, but does want something else for the discomfort. Celebrex not working.  Pharmacy confirmed.  Please advise.

## 2019-07-09 ENCOUNTER — Ambulatory Visit: Payer: Medicare (Managed Care) | Attending: Pain Medicine | Admitting: Pain Medicine

## 2019-07-09 DIAGNOSIS — Z791 Long term (current) use of non-steroidal anti-inflammatories (NSAID): Secondary | ICD-10-CM | POA: Insufficient documentation

## 2019-07-09 DIAGNOSIS — Z79899 Other long term (current) drug therapy: Secondary | ICD-10-CM | POA: Insufficient documentation

## 2019-07-09 DIAGNOSIS — M47816 Spondylosis without myelopathy or radiculopathy, lumbar region: Secondary | ICD-10-CM | POA: Insufficient documentation

## 2019-07-09 DIAGNOSIS — M48061 Spinal stenosis, lumbar region without neurogenic claudication: Secondary | ICD-10-CM | POA: Insufficient documentation

## 2019-07-09 DIAGNOSIS — M7918 Myalgia, other site: Secondary | ICD-10-CM

## 2019-07-09 DIAGNOSIS — M533 Sacrococcygeal disorders, not elsewhere classified: Secondary | ICD-10-CM | POA: Insufficient documentation

## 2019-07-09 DIAGNOSIS — Z7982 Long term (current) use of aspirin: Secondary | ICD-10-CM | POA: Insufficient documentation
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
## 2019-07-09 NOTE — Telephone Encounter (Signed)
Spoke with patient and relayed Dr. Orland Dec response below and she verbalized understanding and was amenable.  States she'll discuss this in more detail with PCP at her up-coming appt in June. Very appreciative of return call.

## 2019-07-11 ENCOUNTER — Telehealth: Payer: Self-pay | Admitting: Pain Medicine

## 2019-07-11 NOTE — Telephone Encounter (Signed)
Patient called to inform MD that she is not interested in RFA procedure. Says she does not need a call back, just wants to inform MD of this.

## 2019-07-15 ENCOUNTER — Ambulatory Visit (INDEPENDENT_AMBULATORY_CARE_PROVIDER_SITE_OTHER): Payer: Medicare (Managed Care) | Admitting: Psychiatry

## 2019-07-15 VITALS — Wt 269.7 lb

## 2019-07-15 DIAGNOSIS — G4700 Insomnia, unspecified: Secondary | ICD-10-CM

## 2019-07-15 DIAGNOSIS — R251 Tremor, unspecified: Secondary | ICD-10-CM

## 2019-07-15 DIAGNOSIS — G259 Extrapyramidal and movement disorder, unspecified: Secondary | ICD-10-CM

## 2019-07-15 DIAGNOSIS — F419 Anxiety disorder, unspecified: Secondary | ICD-10-CM

## 2019-07-15 DIAGNOSIS — F429 Obsessive-compulsive disorder, unspecified: Secondary | ICD-10-CM

## 2019-07-15 DIAGNOSIS — F250 Schizoaffective disorder, bipolar type: Secondary | ICD-10-CM

## 2019-07-15 MED ORDER — ZIPRASIDONE HCL 80 MG OR CAPS
80.0000 mg | ORAL_CAPSULE | Freq: Two times a day (BID) | ORAL | 3 refills | Status: AC
Start: 2019-07-15 — End: 2020-07-09

## 2019-07-15 MED ORDER — GABAPENTIN 600 MG OR TABS
1200.0000 mg | ORAL_TABLET | Freq: Every evening | ORAL | 3 refills | Status: AC
Start: 2019-07-15 — End: 2020-07-09

## 2019-07-15 MED ORDER — DESVENLAFAXINE SUCCINATE 50 MG OR TB24
50.0000 mg | ORAL_TABLET | Freq: Every day | ORAL | 3 refills | Status: AC
Start: 2019-07-15 — End: 2020-07-09

## 2019-07-15 MED ORDER — BUPROPION XL (DAILY) 150 MG OR TB24
150.0000 mg | ORAL_TABLET | Freq: Every morning | ORAL | 3 refills | Status: DC
Start: 2019-07-15 — End: 2019-12-15

## 2019-07-15 MED ORDER — ARIPIPRAZOLE 15 MG OR TABS
15.0000 mg | ORAL_TABLET | Freq: Two times a day (BID) | ORAL | 3 refills | Status: DC
Start: 2019-07-15 — End: 2019-08-17

## 2019-07-15 MED ORDER — TRIHEXYPHENIDYL HCL 2 MG OR TABS
3.0000 mg | ORAL_TABLET | Freq: Every evening | ORAL | 3 refills | Status: DC
Start: 2019-07-15 — End: 2019-11-19

## 2019-07-15 MED ORDER — ARIPIPRAZOLE 15 MG OR TABS
15.0000 mg | ORAL_TABLET | Freq: Two times a day (BID) | ORAL | 3 refills | Status: DC
Start: 2019-07-15 — End: 2019-07-15

## 2019-07-15 MED ORDER — FLUPHENAZINE HCL 5 MG OR TABS
5.0000 mg | ORAL_TABLET | Freq: Every day | ORAL | 3 refills | Status: DC
Start: 2019-07-15 — End: 2019-08-07

## 2019-07-15 NOTE — Progress Notes (Signed)
Outpatient Psychiatry Progress Note      Chief Complaint:   Regularly scheduled follow-up appointment    Identifying Information:  Julie Monroe is a 74 year old female who presents today for a follow-up appointment.     Interval History:  Julie Monroe a74 year old F with a past psychiatric history of schizoaffective d/o, bipolar type, OCD and unspecified insomnia who presents for follow-up. Pt reports mood, "Good," and denies suicidal ideation, intent and plan. Pt denies associated depressive symptoms incl amotivation, anhedonia, decreased interest, decreased concentration and hopelessness. Pt reports stable anxiety. Pt reports intervally improved paranoid ideation (and subsequent anxiety), and does not endorse thought transmission or concern about being followed by "a stalker". Pt denies auditory and visual hallucinations. Pt denies current or recent decreased need for sleep or increased energy > 4 d, hyperverbality, racing thoughts, grandiosity and goal-directed bevavior c/w manic or hypomanic episode at this encounter. Pt reports regular sleep schedule. Pt reports compliance with psychotropic medications as listed below, and denies AEs. Pt reports preference for trial of decreased fluphenazine (as below), after discussing risks, benefits and alternate treatments, which the patient appeared to understand. Pt denies change in upper extremity mixed tremor.    Review of Systems:  Denies fever, chills, chest pain, dyspnea, dizziness, syncope, recent falls, headaches, nausea, and vomiting.      Current Outpatient Medications:   .  ammonium lactate (AMLACTIN) 12 % lotion, Apply 1 Application topically 2 times daily. Rub in to affected area well for dry skin, Disp: 1 bottle, Rfl: 11  .  ARIPiprazole (ABILIFY) 15 MG tablet, Take 1 tablet (15 mg) by mouth in the morning and at bedtime., Disp: 60 tablet, Rfl: 3  .  aspirin 81 MG tablet, Take 81 mg by mouth daily., Disp: , Rfl:   .  atorvastatin (LIPITOR) 40 MG  tablet, Take 1 tablet (40 mg) by mouth daily., Disp: 90 tablet, Rfl: 3  .  BIOTIN PO, Take 1 capsule by mouth daily., Disp: , Rfl:   .  Biotin w/ Vitamins C & E (HAIR/SKIN/NAILS PO), , Disp: , Rfl:   .  buPROPion (WELLBUTRIN XL) 150 MG XL tablet, Take 1 tablet (150 mg) by mouth every morning., Disp: 90 tablet, Rfl: 3  .  Calcium Carbonate 600 MG tablet, Take 600 mg by mouth 2 times daily., Disp: , Rfl:   .  Calcium Carbonate-Vit D-Min (CALCIUM 1200 PO), Take 1 tablet by mouth daily., Disp: , Rfl:   .  celecoxib (CELEBREX) 100 MG capsule, Take 1 capsule (100 mg) by mouth 2 times daily., Disp: 60 capsule, Rfl: 1  .  Cholecalciferol (VITAMIN D3) 50 MCG (2000 UT) capsule, Take 1 capsule by mouth daily., Disp: , Rfl:   .  Clobetasol Propionate (IMPOYZ) 0.025 % CREA, Apply topically., Disp: , Rfl:   .  desvenlafaxine (PRISTIQ) 50 MG TB24, Take 1 tablet (50 mg) by mouth daily., Disp: 90 tablet, Rfl: 3  .  econazole (SPECTAZOLE) 1 % cream, Apply 1 Application topically 2 times daily. Use a small amount as directed, Disp: 1 Tube, Rfl: 1  .  fluPHENAZine (PROLIXIN) 5 MG tablet, Take 1 tablet (5 mg) by mouth daily., Disp: 90 tablet, Rfl: 3  .  gabapentin (NEURONTIN) 600 MG tablet, Take 2 tablets (1,200 mg) by mouth at bedtime., Disp: 180 tablet, Rfl: 3  .  levothyroxine (SYNTHROID) 137 MCG tablet, TK 1 T PO D, Disp: , Rfl:   .  lisinopril (PRINIVIL, ZESTRIL) 2.5 MG tablet, TAKE  1 TABLET(2.5 MG) BY MOUTH DAILY, Disp: 90 tablet, Rfl: 3  .  mirabegron (MYRBETRIQ) 50 MG ER tablet, Take 50 mg by mouth daily., Disp: 90 tablet, Rfl: 3  .  nystatin (MYCOSTATIN) 100000 UNIT/GM powder, Apply 1 Application topically 3 times daily. Apply to affected area, Disp: 60 g, Rfl: 3  .  orlistat (XENICAL) 120 MG capsule, Take 1 capsule (120 mg) by mouth 3 times daily (with meals)., Disp: 90 capsule, Rfl: 3  .  oxybutynin (DITROPAN XL) 10 MG Controlled-Release tablet, Take 1 tablet (10 mg) by mouth daily., Disp: 90 tablet, Rfl: 3  .  sertraline  (ZOLOFT) 100 MG tablet, Take 100 mg by mouth daily., Disp: , Rfl:   .  triamcinolone (KENALOG) 0.1 % ointment, Use twice a day as needed for hand eczema, Disp: 80 g, Rfl: 2  .  trihexyphenidyl (ARTANE) 2 MG tablet, Take 1.5 tablets (3 mg) by mouth nightly., Disp: 135 tablet, Rfl: 3  .  ziprasidone (GEODON) 80 MG capsule, Take 1 capsule (80 mg) by mouth 2 times daily (with meals)., Disp: 180 capsule, Rfl: 3    Vital Signs:  LMP  (LMP Unknown)     Mental Status Exam:    Appearance: Fair hygiene, grooming.  Behavior: Cooperative. Calm.  Gait: No gait abnormality.  Muscle Strength/Tone:Mild to moderate bilateral upper extremity mixed tremor, unchanged  Speech: Normal rate, tone and volume  Mood: "Fine"  Affect:Mildly flattened  Thought Process: Linear, goal-directed   - Associations: Intact  Thought Content: Denies suicidal or homicidal ideation, intent and plan. Denies auditory or visual hallucinations, and does not appear distractible and internally preoccupied. Endorses thought transmission.  Attention Span and Concentration: Mildly distracted in conversation  Language: Able to name objects  Fund of Knowledge: Average  Memory: Both recent and remote memories are intact in context of conversation  Orientation: Intact to person, place and situation  Insight/Judgment:Improved    LABS/ OTHER STUDIES    CHEM 7  Lab Results   Component Value Date/Time    NA 141 11/26/2018 05:12 AM    K 4.5 11/26/2018 05:12 AM    CL 105 11/26/2018 05:12 AM    BUN 18 11/26/2018 05:12 AM    GLU 90 11/26/2018 05:12 AM    [place] 9.7 11/26/2018 05:12 AM     LFT's  Lab Results   Component Value Date/Time    AST 15 11/26/2018 05:12 AM    ALT 9 11/26/2018 05:12 AM    ALB 4.2 11/26/2018 05:12 AM    TP 6.9 11/26/2018 05:12 AM     CBC  Lab Results   Component Value Date/Time    WBC 4.6 07/09/2018 07:41 AM    HGB 13.5 12/12/2018 11:47 AM    HGB 13.5 07/09/2018 07:41 AM    HCT 40.6 12/12/2018 11:47 AM    HCT 40.4 07/09/2018 07:41 AM    PLT 178  12/12/2018 11:47 AM    PLT 182 07/09/2018 07:41 AM     LIPID PANEL  Lab Results   Component Value Date/Time    CHOL 182 11/26/2018 05:12 AM    HDL 80 11/26/2018 05:12 AM    TRIG 66 11/26/2018 05:12 AM     HGBA1c  No components found for: A1C;3  Miscellaneous   TSH   Date Value Ref Range Status   11/26/2018 0.86 0.40 - 4.50 mIU/L Final        Scales:   [place] PHQ9 DEPRESSION QUESTIONNAIRE 10/10/2018 03/04/2019   Interest 0 0  Depressed 1 0   Sleep -- --   Energy -- --   Appetite -- --   Failure -- --   Concentration -- --   Movement -- --   Suicide -- --   Summary(Manual) -- --   Summary(Calculated) -- --   Functional -- --   Some recent data might be hidden     No flowsheet data found.    AIMS (02/07/18):  Facial & Oral Movements: 6  Extremity movements: 3  Trunk Movement: 0  Global Judgment: 1  Total: 10    AIMS (02/20/17):  Facial & Oral Movements: 0  Extremity movements: 2  Trunk Movement: 0  Global Judgment: 1  Total: 3    Assessment:   Julie Monroe a14 year old F with a past psychiatric history of schizoaffective d/o, bipolar type, OCD and unspecified insomnia who presents for follow-up.Pt reports stable mood and anxiety, and intervally improved psychotic s/s following self-titration (well tolerated) of medications as below. Plan to continue psychotropic medications at current dosage, with downtitration of fluphenazine only (after discussion of R/B/A).     She is assessed to be at alow acute risk of suicide.   Clinical Global Impression - Severity:4= Moderately ill  :Suggested Guidelines= Overt symptomatology causing noticeable, but modest, functional impairment. .    Diagnosis/Diagnoses:     ICD-10-CM ICD-9-CM   1. Schizoaffective disorder, bipolar type (CMS-HCC)  F25.0 295.70   2. Obsessive-compulsive disorder, unspecified type  F42.9 300.3   3. Anxiety  F41.9 300.00   4. Insomnia, unspecified type  G47.00 780.52   5. Morbid obesity (CMS-HCC)  E66.01 278.01      Plan:  Medication Management: Risks,  benefits, and alternatives of medications were discussed today with Julie Monroe and she consents to regimen.  #Psychosis:   Decrease to fluphenazine 5 mg PO qDaily, from 10 mg PO qDaily   Continue aripiprazole 15 mgPOBID   Continue ziprasidone 80 mg PO BID w/ food  #EPS   Change totrihexyphenidyl(Artane)3 mg POQHSfor EPS, from 1 mg PO TID  # Anxiety, OCD   Continue gabapentin 1200 mgPO QHS foranxiety and insomnia   Continue desvenlafaxine 50 mgqDaily   Continue bupropion XL 150 mg PO qDaily   D/csertraline 100 mg QAM / 50 mg PO QNoon    In-session psychotherapy:  I spent 20 minutes providing psychotherapy for this encounter.  Treatment modality(ies) employed: Psychoeducation and supportive  Progress to date: Improving  -- Psychoeducation self-titration of medications and supportive psychotherapy  Treatment plan: Continue psychoeducation and supportive  Level of Care: Pt currently does not meet criteria for inpatient psychiatric hospitalization.    Note Author:  Newt Lukes, MD  Resident/Fellow Physician

## 2019-07-15 NOTE — Progress Notes (Signed)
Attending Note:  The clinical history was reviewed with the resident physician. The patient was seen, evaluated, and the care plan developed with the resident physician. I was present with the resident during the review of the critical elements of the treatment plan and clinical interview. I agree with the findings and plan as documented. Any additions or revisions are included in the record.   Please see resident documentation for further details of our assessment and treatment recommendations.   No safety concerns were identified at today's visit.  Total time spent providing psychotherapy was 20+ minutes.

## 2019-07-17 ENCOUNTER — Telehealth: Payer: Self-pay | Admitting: Internal Medicine

## 2019-07-17 DIAGNOSIS — F250 Schizoaffective disorder, bipolar type: Secondary | ICD-10-CM

## 2019-07-17 DIAGNOSIS — R7989 Other specified abnormal findings of blood chemistry: Secondary | ICD-10-CM

## 2019-07-17 DIAGNOSIS — E782 Mixed hyperlipidemia: Secondary | ICD-10-CM

## 2019-07-17 DIAGNOSIS — R635 Abnormal weight gain: Secondary | ICD-10-CM

## 2019-07-17 DIAGNOSIS — E039 Hypothyroidism, unspecified: Secondary | ICD-10-CM

## 2019-07-17 NOTE — Telephone Encounter (Signed)
Attention Nurse Verlon Au:  Patient states she is returning your call and requests another call soon.     Would like to speak with you.  Thank you.

## 2019-07-21 NOTE — Telephone Encounter (Addendum)
Spoke with patient who states that [facility] stopped its contract with [employer] so they'll need to come to [place] lab for the blood draw.  Assured her that the lab orders are in the [facility] system for her to get drawn, fasting, before her 06/15 appt with Dr. [name].  Very appreciative.

## 2019-07-21 NOTE — Addendum Note (Signed)
Addended by: Dereck Ligas on: 07/21/2019 10:40 AM     Modules accepted: Orders

## 2019-07-25 ENCOUNTER — Other Ambulatory Visit
Admission: RE | Admit: 2019-07-25 | Discharge: 2019-07-25 | Disposition: A | Discharge status: Home / Self Care | Payer: Medicare (Managed Care) | Source: Ambulatory Visit | Attending: Internal Medicine | Admitting: Internal Medicine

## 2019-07-25 DIAGNOSIS — E782 Mixed hyperlipidemia: Secondary | ICD-10-CM | POA: Insufficient documentation

## 2019-07-25 DIAGNOSIS — R635 Abnormal weight gain: Secondary | ICD-10-CM | POA: Insufficient documentation

## 2019-07-25 DIAGNOSIS — E039 Hypothyroidism, unspecified: Secondary | ICD-10-CM | POA: Insufficient documentation

## 2019-07-25 DIAGNOSIS — F250 Schizoaffective disorder, bipolar type: Secondary | ICD-10-CM | POA: Insufficient documentation

## 2019-07-25 DIAGNOSIS — R7989 Other specified abnormal findings of blood chemistry: Secondary | ICD-10-CM | POA: Insufficient documentation

## 2019-07-25 LAB — COMPREHENSIVE METABOLIC PANEL, BLOOD
ALT: 12 U/L (ref 7–52)
AST: 14 U/L (ref 13–39)
Albumin: 4.1 G/DL (ref 3.7–5.3)
Alk Phos: 94 U/L (ref 34–104)
BUN: 20 mg/dL (ref 7–25)
Bilirubin, Total: 0.8 mg/dL (ref 0.0–1.4)
CO2: 30 mmol/L (ref 21–31)
Calcium: 10 mg/dL (ref 8.6–10.3)
Chloride: 104 mmol/L (ref 98–107)
Creat: 0.8 mg/dL (ref 0.6–1.2)
Electrolyte Balance: 9 mmol/L (ref 2–12)
Glucose: 99 mg/dL (ref 85–125)
Potassium: 4.3 mmol/L (ref 3.5–5.1)
Protein, Total: 7 G/DL (ref 6.0–8.3)
Sodium: 143 mmol/L (ref 136–145)
eGFR - high estimate: >60 (ref >59)
eGFR - low estimate: >60 (ref >59)

## 2019-07-25 LAB — HEMOGRAM, BLOOD
Hematocrit: 37.9 % (ref 34.0–44.0)
Hgb: 13.3 G/DL (ref 11.5–15.0)
MCH: 30.2 PG (ref 27.0–33.5)
MCHC: 35 G/DL (ref 32.0–35.5)
MCV: 86.2 FL (ref 81.5–97.0)
MPV: 10 FL (ref 7.2–11.7)
PLT Count: 179 10*3/uL (ref 150–400)
RBC: 4.39 10*6/uL (ref 3.70–5.00)
RDW-CV: 13.7 % (ref 11.6–14.4)
White Bld Cell Count: 5.5 10*3/uL (ref 4.0–10.5)

## 2019-07-25 LAB — TSH, BLOOD: TSH, Ultrasensitive: 0.462 u[IU]/mL (ref 0.450–4.120)

## 2019-07-25 LAB — LIPID(CHOL FRACT) PANEL, BLOOD
Cholesterol: 147 MG/DL (ref <200)
HDL Cholesterol: 69 MG/DL (ref >40)
LDL Cholesterol (calc): 69 MG/DL (ref <160)
Non HDL Cholesterol (calculated): 78 MG/DL (ref <130)
Triglycerides: 45 MG/DL (ref <150)
VLDL Cholesterol (calculated): 9 MG/DL

## 2019-07-25 LAB — GLYCOSYLATED HGB(A1C), BLOOD: Glycated Hgb, A1C: 5 % (ref 4.6–5.6)

## 2019-07-25 LAB — FREE THYROXINE, BLOOD: Free T4: 1.11 ng/dL (ref 0.60–1.12)

## 2019-07-29 ENCOUNTER — Ambulatory Visit: Payer: Medicare (Managed Care) | Admitting: Internal Medicine

## 2019-07-29 ENCOUNTER — Ambulatory Visit: Payer: Medicare (Managed Care) | Attending: Internal Medicine | Admitting: Internal Medicine

## 2019-07-29 ENCOUNTER — Encounter: Payer: Self-pay | Admitting: Internal Medicine

## 2019-07-29 VITALS — BP 122/78 | HR 68 | Temp 97.9°F | Resp 16 | Wt 270.7 lb

## 2019-07-29 DIAGNOSIS — E782 Mixed hyperlipidemia: Secondary | ICD-10-CM | POA: Insufficient documentation

## 2019-07-29 DIAGNOSIS — E039 Hypothyroidism, unspecified: Secondary | ICD-10-CM | POA: Insufficient documentation

## 2019-07-29 DIAGNOSIS — Z0000 Encounter for general adult medical examination without abnormal findings: Secondary | ICD-10-CM | POA: Insufficient documentation

## 2019-07-29 DIAGNOSIS — Z6841 Body Mass Index (BMI) 40.0 and over, adult: Secondary | ICD-10-CM | POA: Insufficient documentation

## 2019-07-29 DIAGNOSIS — E7800 Pure hypercholesterolemia, unspecified: Secondary | ICD-10-CM | POA: Insufficient documentation

## 2019-07-29 DIAGNOSIS — M25551 Pain in right hip: Secondary | ICD-10-CM | POA: Insufficient documentation

## 2019-07-29 DIAGNOSIS — R413 Other amnesia: Secondary | ICD-10-CM | POA: Insufficient documentation

## 2019-07-29 MED ORDER — DICLOFENAC SODIUM 1 % EX GEL
4.0000 g | Freq: Three times a day (TID) | CUTANEOUS | 0 refills | Status: DC | PRN
Start: 2019-07-29 — End: 2019-09-16

## 2019-07-29 NOTE — Progress Notes (Signed)
FOLLOW UP VISIT NOTE    Date of Visit: July 29, 2019    Chief Complaint: Physical    History of Present Illness: Julie Monroe is a 74 year old female here for Physical    Date of Last Evaluation: March 11, 2019    Labs from 07/25/2019 reviewed:  CMP nl  LFTs nl  Chol: TC 147, Tg 45, HDL 69, LDL 69  TSH 4.62, FT4 1.11  AIC 5.0  CBC nl    Last dexa 12/11/2018: normal bone density  Last mammo 12/11/2018: BI-RADS-2  Last colonoscopy: 12/2018, recall 5 yrs  Last pap smear: NA    Last colonoscopy one week ago - 2 polyps, recall 5 years  Last pap smear - sees outside GYN, will see her in 2 yrs, last was 2 yrs ago  Last mammogram - 1 yr ago, was normal    Vaccines:   (Shingles) Herpes Zoster Vaccine (ZOSTAVAX)04/22/2014   COVID-19 (Pfizer)03/29/2019, 03/08/2019   Influenza Vaccine (High Dose) >=65 Years9/03/2018, 11/04/2017   Influenza Vaccine (Unspecified)10/26/2015   Pneumococcal 13 Vaccine (PREVNAR-13)10/19/2014   Pneumococcal 23 Vaccine (PNEUMOVAX-23)10/17/2015   Tdap1/02/2016    Shingrix?    # memory problems - short term memory is worse, also noticed by spouse    # concerns for weight loss - has been stable, has been seeing Dr. Julianne Handler, wants to try Semaglutide    # Hypothyroid - labs from 07/2019 show: TSH 4.62, FT4 1.11. On Synthroid 150mg.  No sxs, wants referral to endo    # Right hip pain - only with walking and sitting, non radiating, started one month ago, change   No fall or trauma    # HLD - on Lipitor 444mQHS, labs show: Chol: TC 147, Tg 45, HDL 69, LDL 69    # CAD - seeing cards, rare palpitation.     # Bruising - easy to bruise    # Mild aortic valve stenosis- plan is to f/u on this with repeat 2d echo, order is pending.    # Morbid obesity - is following with Dr. NaJulianne Handler Would like to take Semaglutide.     # Depression - seeing psychiatrist. On Geodon, Zoloft, Fluphenazine, Pristiq, Abilify and Artane. Also takes Gabapentin for sleep. Depression is not severe but is persistent.       Last  mammo 12/11/2018:  MAMMOGRAM FINDINGS:  The patient's digital mammographic images were analyzed using  computer-aided detection technology. The results of that analysis were  utilized in the final interpretation of the exam.     There is a microclip in the right breast.     There is no suspicious mass, calcification or architectural distortion to  suggest malignancy.     IMPRESSION:     ACR BI-RADS Category 2 -  Benign Finding.     RECOMMENDATION:     There is no evidence of malignancy. Benign Findings. There is no  mammographic evidence of malignancy.     A routine follow-up mammogram in 1 year is recommended unless clinically  indicated otherwise. Patient information has been entered into a reminder  system with a target due date of 1 year.         Last dexa 12/11/2018: normal bone density    Narrative & Impression  DXA BONE DENSITOMETRY    Exam Date:  12/11/2018 10:01 AM    HISTORY AND RISK FACTORS:  [age]ear old female on vitamin D and calcium supplements. The patient has been diagnosed with hypothyroidism.  Reason for exam: osteopenia; Other specified disorders of bone density and structure, left ankle and foot; Other specified disorders of bone density and structure, unspecified site.    Comparison: Accurate comparison cannot be made to outside DEXA scan, due to the difference in equipment.    Technique: The patient underwent PA lumbar spine and proximal left femur DXA bone density studies on a [employer] W, software version 13.5.3.1. No technical difficulties were encountered.  A review of the images did not suggest fracture, degenerative change, or artifact severe enough to significantly affect the interpretation of the numerical results.  The measurements at each major site are listed below.                          T-score    BMD (g/cm2)                                                                           Lumbar spine:  0.4             1.089           L1-L4    Femoral neck:  -0.6              0.781              Left hip:             -0.6             0.872               IMPRESSION:   Normal bone mineral density, based on the T-scores of -1.0 or greater, which fulfill the WHO criteria for normal bone density in postmenopausal women.        Past Medical History:  Patient Active Problem List   Diagnosis    Schizoaffective disorder, bipolar type (CMS-HCC)    Insomnia    Morbid obesity (CMS-HCC)    Tremor    Osteoarthrosis    Hypothyroidism    Obsessive-compulsive disorder, unspecified type    Anxiety    Extrapyramidal and movement disorder    Pure hypercholesterolemia    Chest pain, unspecified type    RBBB    Mild aortic valve stenosis    Mixed hyperlipidemia    Actinic keratosis    Solar lentigo         Past Surgical History:  Past Surgical History:   Procedure Laterality Date    Biilat. Knee replacement      Rt toe surgery      TOTAL KNEE ARTHROPLASTY Bilateral          Social Health:  Social History     Socioeconomic History    Marital status: Married     Spouse name: Not on file    Number of children: Not on file    Years of education: Not on file    Highest education level: Not on file   Occupational History    Not on file   Tobacco Use    Smoking status: Former Smoker     Packs/day: 2.00     Years: 30.00     Pack years: 60.00    Smokeless  tobacco: Never Used   Substance and Sexual Activity    Alcohol use: No    Drug use: No    Sexual activity: Never   Other Topics Concern    Not on file   Social History Narrative    Not on file     Social Determinants of Health     Financial Resource Strain:     Difficulty of Paying Living Expenses:    Food Insecurity:     Worried About Charity fundraiser in the Last Year:     Arboriculturist in the Last Year:    Transportation Needs:     Film/video editor (Medical):     Lack of Transportation (Non-Medical):    Physical Activity:     Days of Exercise per Week:     Minutes of Exercise per Session:    Stress:      Feeling of Stress :    Social Connections:     Frequency of Communication with Friends and Family:     Frequency of Social Gatherings with Friends and Family:     Attends Religious Services:     Active Member of Clubs or Organizations:     Attends Music therapist:     Marital Status:    Intimate Partner Violence:     Fear of Current or Ex-Partner:     Emotionally Abused:     Physically Abused:     Sexually Abused:          Family Health:  Family History   Problem Relation Age of Onset    Other Mother         Wegener    Cancer Brother         Leukemia    Cancer Father         Leukemia    Heart Disease Father     Hypertension Father     No Known Problems Sister     Cancer Brother         Bladder Cancer         Allergies:   No Known Allergies      Current Meds:   Outpatient Medications Marked as Taking for the 07/29/19 encounter (Office Visit) with Charlotte Crumb, MD   Medication Sig Dispense Refill    ammonium lactate (AMLACTIN) 12 % lotion Apply 1 Application topically 2 times daily. Rub in to affected area well for dry skin 1 bottle 11    ARIPiprazole (ABILIFY) 15 MG tablet Take 1 tablet (15 mg) by mouth in the morning and at bedtime. 60 tablet 3    aspirin 81 MG tablet Take 81 mg by mouth daily.      atorvastatin (LIPITOR) 40 MG tablet Take 1 tablet (40 mg) by mouth daily. 90 tablet 3    Biotin w/ Vitamins C & E (HAIR/SKIN/NAILS PO)       buPROPion (WELLBUTRIN XL) 150 MG XL tablet Take 1 tablet (150 mg) by mouth every morning. 90 tablet 3    desvenlafaxine (PRISTIQ) 50 MG TB24 Take 1 tablet (50 mg) by mouth daily. 90 tablet 3    econazole (SPECTAZOLE) 1 % cream Apply 1 Application topically 2 times daily. Use a small amount as directed 1 Tube 1    fluPHENAZine (PROLIXIN) 5 MG tablet Take 1 tablet (5 mg) by mouth daily. 90 tablet 3    gabapentin (NEURONTIN) 600 MG tablet Take 2 tablets (1,200 mg) by mouth at bedtime. 180 tablet  3    levothyroxine (SYNTHROID) 137 MCG tablet TK  1 T PO D      lisinopril (PRINIVIL, ZESTRIL) 2.5 MG tablet TAKE 1 TABLET(2.5 MG) BY MOUTH DAILY 90 tablet 3    mirabegron (MYRBETRIQ) 50 MG ER tablet Take 50 mg by mouth daily. 90 tablet 3    nystatin (MYCOSTATIN) 100000 UNIT/GM powder Apply 1 Application topically 3 times daily. Apply to affected area 60 g 3    oxybutynin (DITROPAN XL) 10 MG Controlled-Release tablet Take 1 tablet (10 mg) by mouth daily. 90 tablet 3    trihexyphenidyl (ARTANE) 2 MG tablet Take 1.5 tablets (3 mg) by mouth nightly. 135 tablet 3    ziprasidone (GEODON) 80 MG capsule Take 1 capsule (80 mg) by mouth 2 times daily (with meals). 180 capsule 3         Review of Systems: 12-point ROS is negative except as stated above in the HPI    Vital Signs:  BP 122/78 (BP Location: Left arm, BP Patient Position: Sitting, BP cuff size: Large)    Pulse 68    Temp 97.9 F (36.6 C) (Temporal Artery)    Resp 16    Wt 122.8 kg (270 lb 11.6 oz)    LMP  (LMP Unknown)    BMI 43.70 kg/m     PHYSICAL EXAM:  General: Alert, cooperative, and interactive  Head/Ears/Nose/Throat: normocephalic; nasal trace cerumen in left EAC, none in right EAC  Eyes: pupils equally round and reactive to light.  Neck: supple; ROM intact; no thyromegaly or thyroid tenderness to palpation  Pulmonary: clear bilaterally; no wheezes, rales or rhonchi  Cardiovascular: regular rhythm; regular rate; normal Y1,P5; + systolic murmur RUSB  Abdominal: soft; nondistended; nontender to palpation; normal bowel sounds  MSK: + TTP in right posterior pevlis  Skin Details: no rashes, no ecchymoses or petechiae, normal skin turgor  Neurological: alert and oriented times 3  Psychiatric Details:  Affect normal; mood matches affect    Diagnostic Data:  Albumin   Date Value Ref Range Status   07/25/2019 4.1 3.7 - 5.3 G/DL Final     ALT   Date Value Ref Range Status   07/25/2019 12 7 - 52 U/L Final     AST   Date Value Ref Range Status   07/25/2019 14 13 - 39 U/L Final     BUN   Date Value Ref Range  Status   07/25/2019 20 7 - 25 mg/dL Final     Calcium   Date Value Ref Range Status   07/25/2019 10.0 8.6 - 10.3 mg/dL Final     Chloride   Date Value Ref Range Status   07/25/2019 104 98 - 107 mmol/L Final     Cholesterol   Date Value Ref Range Status   07/25/2019 147 <200 MG/DL Final     Comment:     Desirable: <200 mg/dL  Borderline High: 200-239 mg/dL  High: >240 mg/dL       CO2   Date Value Ref Range Status   07/25/2019 30 21 - 31 mmol/L Final     Creat   Date Value Ref Range Status   07/25/2019 0.8 0.6 - 1.2 mg/dL Final     eGFR - low estimate   Date Value Ref Range Status   07/25/2019 >60 >59 Final     eGFR - high estimate   Date Value Ref Range Status   07/25/2019 >60 >59 Final     Comment:     (  Unit: mL/min/1.73 sq mtr)  The calculated GFR is an estimate and is NOT an accurate reflection of GFR in   patients on dialysis. The accuracy of estimated GFR is also affected by muscle   mass, and medications that affect renal tubular secretion of creatinine.  If estimated GFR will directly affect clinical decision making, consider using cystatin C to estimate GFR, and/or consult with nephrology. eGFR was calculated using the MDRD equation (2006).       eGFR non-Afr.American   Date Value Ref Range Status   11/26/2018 74 > OR = 60 mL/min/1.67m Final     Glucose   Date Value Ref Range Status   07/25/2019 99 85 - 125 mg/dL Final     Comment:        Normal Fasting Glucose:  <100 mg/dL  Impaired Fasting Glucose: 100-125 mg/dL  Provisional DX of diabetes(must be confirmed) >125 mg/dL       HDL Cholesterol   Date Value Ref Range Status   11/26/2018 80 > OR = 50 mg/dL Final     Hgb   Date Value Ref Range Status   07/25/2019 13.3 11.5 - 15.0 G/DL Final     Glycated Hgb, A1C   Date Value Ref Range Status   07/25/2019 5.0 4.6 - 5.6 % Final     Comment:     (NOTE)  Reference values for HgA1c:        Non-Diabetic: 4.6 - 5.6%    HbA1c cutoffs for assessing diabetes:      Increased risk for diabetes: 5.7 - 6.4%      Diabetes:   >6.4%    ADA recommended HbA1c goals in treatment of diabetes:      Adults: <7.0%      Children and adolescents with type I Diabetes: <7.5%      Comment: A lower goal of <7.0% is reasonable if it      can be achieved without excessive hypoglycemia.      Goals should be individualized.    Reference: Diabetes Care January 2017.       LDL-Cholesterol   Date Value Ref Range Status   11/26/2018 87 mg/dL (calc) Final     Comment:     Reference range: <100     Desirable range <100 mg/dL for primary prevention;    <70 mg/dL for patients with CHD or diabetic patients   with > or = 2 CHD risk factors.     LDL-C is now calculated using the Martin-Hopkins   calculation, which is a validated novel method providing   better accuracy than the Friedewald equation in the   estimation of LDL-C.   MCresenciano Genreet al. JAnnamaria Helling [id number];960(45: [id number]   (http://education.[facility].com/faq/FAQ164)       PLT Count   Date Value Ref Range Status   07/25/2019 179 150 - 400 THOUS/MCL Final     Potassium   Date Value Ref Range Status   07/25/2019 4.3 3.5 - 5.1 mmol/L Final     Sodium   Date Value Ref Range Status   07/25/2019 143 136 - 145 mmol/L Final   11/26/2018 141 135 - 146 mmol/L Final     Triglycerides   Date Value Ref Range Status   07/25/2019 45 <150 MG/DL Final     Comment:     Normal: <150 mg/dL  Borderline High: 150-199 mg/dL  High: 200-499 mg/dL  Very high: > or = 500 mg/dL       WBC   Date  Value Ref Range Status   07/09/2018 4.6 3.8 - 10.8 Thousand/uL Final       Imaging Data:  See above    Assessment and Plan:  1. Pure hypercholesterolemia  On Atorva 39m QHS, ccm.   - Hemogram Lavender; Future  - Comprehensive Metabolic Panel; Future  - Lipid Panel Green Plasma Separator Tube; Future    2. Mixed hyperlipidemia  see above    3. Hypothyroidism, unspecified type  Reassured pt that TFTs at goal, but pt would like endo eval/recommendatios, so referral placed.   - Consult/Referral to Endocrinology  - TSH, Blood - See Instructions;  Future  - Free Thyroxine, Blood - See Instructions; Future    4. Memory changes  Will check labs, neuropsych eval ordered.   - Neuropsychological Testing; Future  - Vitamin B12, Blood Green Plasma Separator Tube; Future  - Folic Acid, Blood Yellow serum separator tube; Future  - Syphilis Screen, Blood Yellow serum separator tube; Future    5. Morbid obesity (CMS-HCC)  Is following with Dr. NJulianne Handler recommend discussing use of Semaglutide with her.   - Glycosylated Hgb(A1C), Blood Lavender; Future    6. Pain in joint involving right pelvic region and thigh  Recommend PT but pt declines, also declines pain mgmt for now. Will use diclofenac - if no relief, will then do XR.   - X-Ray Hips Bilateral With Pelvis 3 or 4 Views; Future  - Vitamin D, 25-OH Total Yellow serum separator tube; Future    7. Physical exam, annual   Age and gender appropriate screening and counseling provided.   Will obtain relevant labs including a fasting lipid panel, thyroid function, and diabetic screening.     Discussed healthy lifestyle and diet.   Discussed importance of regular exercise to include cardiovascular exercise 4-5 times per day for at least 30-45 minutes per session.   Immunizations recommendations provided.    Mammogram:  UTD, normal   Pap smear:  UTD (sees outside GYN)   Colonoscopy: 12/2018 recall 5 yrs   Dexa: UTD, normal       54 minutes spent preparing to see patient (including chart review and preparation), obtaining and or reviewing additional medical history,  documenting clinical information in the electronic health record, independently interpreting results, communication results to family or caregiver, and/or coordinating care.

## 2019-07-29 NOTE — Patient Instructions (Signed)
Plaese hold any BIOTIN (Vit B7) for one week prior to your labs in 3 months.

## 2019-07-31 ENCOUNTER — Encounter: Payer: Self-pay | Admitting: Internal Medicine

## 2019-07-31 ENCOUNTER — Telehealth: Payer: Self-pay | Admitting: Psychiatry

## 2019-07-31 DIAGNOSIS — Z1211 Encounter for screening for malignant neoplasm of colon: Secondary | ICD-10-CM

## 2019-07-31 NOTE — Telephone Encounter (Signed)
Pt called stating she cannot wait til 9/2 for her next appointment to see Dr. Ardis Hughs which will be continuing care since pt was seeing Dr. Willaim Bane along with her husband. She wants a dosage change to go back up to 10 mg for one of her medications. She asked to see Dr. Hazle Nordmann but told pt he is not seeing pts directly and then they asked to see Dr. Willaim Bane but told them he graduated and provided enough refills on medications to last until next follow up on 9/2. As per pt she needs to see a resident for a one- time visit that's under Dr. Ladoris Gene clinic/supervsion as soon as possible.     If it is ok, may I add her to see Dr. Delford Field tomorrow 6/18 for a one time visit and will still continue care with Dr. Ardis Hughs after that.

## 2019-08-05 NOTE — Telephone Encounter (Signed)
Pt called back asking if she can see a resident under Dr. Hazle Nordmann for a one-time visit. Dr. Hazle Nordmann had approved visit with Dr. Delford Field last Friday 6/18 but appt waw missed. Pt requesting to see Dr. Joellyn Rued tomorrow via Telemedicine to discuss dose changes to Prolixin. Dr. Hazle Nordmann, do you approve a telemedicine visit?        Previous physician Responses:    Cathie Hoops, MD  You; Radene Knee, MD 5 days ago     Yes, thanks for the heads up.    Message text    Radene Knee, MD  Bonita Quin; Cathie Hoops, MD 5 days ago     I'm fine with it, thanks for checking, will wait for Dr. Ladoris Gene response    Message text

## 2019-08-06 ENCOUNTER — Telehealth: Payer: Medicare (Managed Care) | Admitting: Psychiatry

## 2019-08-06 ENCOUNTER — Telehealth: Payer: Self-pay | Admitting: Psychiatry

## 2019-08-06 ENCOUNTER — Telehealth: Payer: Self-pay | Admitting: Urology

## 2019-08-06 DIAGNOSIS — F429 Obsessive-compulsive disorder, unspecified: Secondary | ICD-10-CM

## 2019-08-06 DIAGNOSIS — F250 Schizoaffective disorder, bipolar type: Secondary | ICD-10-CM

## 2019-08-06 NOTE — Telephone Encounter (Signed)
Pt called in to request for Dr Joellyn Rued to submit refill for PROLIXIN 10 mg.    She says they agreed to bump dosage up to 10 mg.

## 2019-08-06 NOTE — Telephone Encounter (Signed)
Patient states she has three problems concerning overactive bladder and she does not think it can wait until August. Patient states a telemedicine appointment is okay.

## 2019-08-07 MED ORDER — FLUPHENAZINE HCL 10 MG OR TABS
10.0000 mg | ORAL_TABLET | Freq: Every day | ORAL | 0 refills | Status: AC
Start: 2019-08-07 — End: 2019-11-05

## 2019-08-07 NOTE — Progress Notes (Signed)
Attending Attestation:  I was present with the resident physician during the the key portions of the exam.  I interviewed Julie Monroe over the phone and performed a limited mental status exam.  I participated in the medical decision making and management of the patient. I agree with the findings and plan as documented in Liana Crocker, MD's note.  I provided supervision for supportive and other psychotherapy modalities employed. My additions or revisions are included in the record as needed.     Debbra Riding, MD  Attending Physician

## 2019-08-07 NOTE — Progress Notes (Signed)
Due to COVID-19 pandemic and a federally declared state of public health emergency, this telemedicine visit was conducted Audio Only. Total time spent was 21+ minutes.     Outpatient Psychiatry Progress Note    Chief Complaint:   Patient requested phone appointment due to difficult with technology for video    Interval History:  Julie Monroe a43 year old F with a past psychiatric history of schizoaffective d/o, bipolar type, OCD and unspecified insomnia who presents for follow-up. Pt reports mood is stable and denies suicidal ideation, intent and plan. Pt denies associated depressive symptoms incl amotivation, anhedonia, decreased interest, decreased concentration and hopelessness. She reports overall stability in mood and anxiety. However, since decreasind Prolixin has been experiencing increased AH. She would like to go back to her previous dose of Prolixin 10mg  which she felt controlled her symptoms more effectively. Otherwise, denies PI, VH. Denies command AH. Reports tremor is stable and not bothersome. Is retired and keeps busy by doing things around the house. Is not exercising very much, but plans to increase frequency of walks to achieve better weight control.    Review of Systems:  Denies fever, chills, chest pain, dyspnea, dizziness, syncope, recent falls, headaches, nausea, and vomiting.      Current Outpatient Medications:   .  ammonium lactate (AMLACTIN) 12 % lotion, Apply 1 Application topically 2 times daily. Rub in to affected area well for dry skin, Disp: 1 bottle, Rfl: 11  .  ARIPiprazole (ABILIFY) 15 MG tablet, Take 1 tablet (15 mg) by mouth in the morning and at bedtime., Disp: 60 tablet, Rfl: 3  .  aspirin 81 MG tablet, Take 81 mg by mouth daily., Disp: , Rfl:   .  atorvastatin (LIPITOR) 40 MG tablet, Take 1 tablet (40 mg) by mouth daily., Disp: 90 tablet, Rfl: 3  .  BIOTIN PO, Take 1 capsule by mouth daily., Disp: , Rfl:   .  Biotin w/ Vitamins C & E (HAIR/SKIN/NAILS PO), , Disp: ,  Rfl:   .  buPROPion (WELLBUTRIN XL) 150 MG XL tablet, Take 1 tablet (150 mg) by mouth every morning., Disp: 90 tablet, Rfl: 3  .  Calcium Carbonate 600 MG tablet, Take 600 mg by mouth 2 times daily., Disp: , Rfl:   .  Calcium Carbonate-Vit D-Min (CALCIUM 1200 PO), Take 1 tablet by mouth daily., Disp: , Rfl:   .  celecoxib (CELEBREX) 100 MG capsule, Take 1 capsule (100 mg) by mouth 2 times daily., Disp: 60 capsule, Rfl: 1  .  Cholecalciferol (VITAMIN D3) 50 MCG (2000 UT) capsule, Take 1 capsule by mouth daily., Disp: , Rfl:   .  Clobetasol Propionate (IMPOYZ) 0.025 % CREA, Apply topically., Disp: , Rfl:   .  desvenlafaxine (PRISTIQ) 50 MG TB24, Take 1 tablet (50 mg) by mouth daily., Disp: 90 tablet, Rfl: 3  .  diclofenac (VOLTAREN) 1 % gel, Apply 4 g topically every 8 hours as needed (pain)., Disp: 1 Tube, Rfl: 0  .  econazole (SPECTAZOLE) 1 % cream, Apply 1 Application topically 2 times daily. Use a small amount as directed, Disp: 1 Tube, Rfl: 1  .  fluPHENAZine (PROLIXIN) 5 MG tablet, Take 1 tablet (5 mg) by mouth daily., Disp: 90 tablet, Rfl: 3  .  gabapentin (NEURONTIN) 600 MG tablet, Take 2 tablets (1,200 mg) by mouth at bedtime., Disp: 180 tablet, Rfl: 3  .  levothyroxine (SYNTHROID) 137 MCG tablet, TK 1 T PO D, Disp: , Rfl:   .  lisinopril (  PRINIVIL, ZESTRIL) 2.5 MG tablet, TAKE 1 TABLET(2.5 MG) BY MOUTH DAILY, Disp: 90 tablet, Rfl: 3  .  mirabegron (MYRBETRIQ) 50 MG ER tablet, Take 50 mg by mouth daily., Disp: 90 tablet, Rfl: 3  .  nystatin (MYCOSTATIN) 100000 UNIT/GM powder, Apply 1 Application topically 3 times daily. Apply to affected area, Disp: 60 g, Rfl: 3  .  orlistat (XENICAL) 120 MG capsule, Take 1 capsule (120 mg) by mouth 3 times daily (with meals)., Disp: 90 capsule, Rfl: 3  .  oxybutynin (DITROPAN XL) 10 MG Controlled-Release tablet, Take 1 tablet (10 mg) by mouth daily., Disp: 90 tablet, Rfl: 3  .  sertraline (ZOLOFT) 100 MG tablet, Take 100 mg by mouth daily., Disp: , Rfl:   .  triamcinolone  (KENALOG) 0.1 % ointment, Use twice a day as needed for hand eczema, Disp: 80 g, Rfl: 2  .  trihexyphenidyl (ARTANE) 2 MG tablet, Take 1.5 tablets (3 mg) by mouth nightly., Disp: 135 tablet, Rfl: 3  .  ziprasidone (GEODON) 80 MG capsule, Take 1 capsule (80 mg) by mouth 2 times daily (with meals)., Disp: 180 capsule, Rfl: 3    Vital Signs:  LMP  (LMP Unknown)     Mental Status Exam:    Speech: Normal rate, tone and volume  Mood: "Fine"  Thought Process: Linear, goal-directed   Thought Content: Denies suicidal or homicidal ideation, intent and plan. Denies visual hallucinations, and does not appear distractible and internally preoccupied. +ve intermittent AH  Attention Span and Concentration: Not distracted in conversation  Language: Able to name objects  Fund of Knowledge: Average  Memory: Both recent and remote memories are intact in context of conversation  Orientation: Intact to person, place and situation  Insight/Judgment:Improved    LABS/ OTHER STUDIES    CHEM 7  Lab Results   Component Value Date/Time    NA 141 11/26/2018 05:12 AM    K 4.3 07/25/2019 07:14 AM    K 4.5 11/26/2018 05:12 AM    CL 104 07/25/2019 07:14 AM    CL 105 11/26/2018 05:12 AM    CO2 30 07/25/2019 07:14 AM    BUN 20 07/25/2019 07:14 AM    BUN 18 11/26/2018 05:12 AM    GLU 99 07/25/2019 07:14 AM    GLU 90 11/26/2018 05:12 AM    [place] 10.0 07/25/2019 07:14 AM     9.7 11/26/2018 05:12 AM     LFT's  Lab Results   Component Value Date/Time    AST 14 07/25/2019 07:14 AM    AST 15 11/26/2018 05:12 AM    ALT 12 07/25/2019 07:14 AM    ALT 9 11/26/2018 05:12 AM    ALB 4.1 07/25/2019 07:14 AM    ALB 4.2 11/26/2018 05:12 AM    TP 6.9 11/26/2018 05:12 AM     CBC  Lab Results   Component Value Date/Time    WBC 4.6 07/09/2018 07:41 AM    HGB 13.3 07/25/2019 07:14 AM    HGB 13.5 07/09/2018 07:41 AM    HCT 37.9 07/25/2019 07:14 AM    HCT 40.4 07/09/2018 07:41 AM    PLT 179 07/25/2019 07:14 AM    PLT 182 07/09/2018 07:41 AM     LIPID PANEL  Lab Results    Component Value Date/Time    CHOL 147 07/25/2019 07:14 AM    CHOL 182 11/26/2018 05:12 AM    HDL 80 11/26/2018 05:12 AM    TRIG 45 07/25/2019 07:14 AM  TRIG 66 11/26/2018 05:12 AM     HGBA1c  No components found for: A1C;3  Miscellaneous   TSH   Date Value Ref Range Status   11/26/2018 0.86 0.40 - 4.50 mIU/L Final        Scales:   [facility] PHQ9 DEPRESSION QUESTIONNAIRE 10/10/2018 03/04/2019 07/29/2019   Interest 0 0 0   Depressed 1 0 0   Sleep -- -- --   Energy -- -- --   Appetite -- -- --   Failure -- -- --   Concentration -- -- --   Movement -- -- --   Suicide -- -- --   Summary(Manual) -- -- --   Summary(Calculated) -- -- --   Functional -- -- --   Some recent data might be hidden     No flowsheet data found.    AIMS (02/07/18):  Facial & Oral Movements: 6  Extremity movements: 3  Trunk Movement: 0  Global Judgment: 1  Total: 10    AIMS (02/20/17):  Facial & Oral Movements: 0  Extremity movements: 2  Trunk Movement: 0  Global Judgment: 1  Total: 3    Assessment:   Julie Monroe a48 year old F with a past psychiatric history of schizoaffective d/o, bipolar type, OCD and unspecified insomnia who presents for follow-up via telephone.Pt reports stable mood and anxiety, however has had an increase in frequency of AH since decreasing prolixin. Would like to go back to her previous dose of 10mg . Discussed risks, benefits, and alternatives with patient and she is agreeable to start 10mg  po qDaily. Recommend patient have zoom or in person visit in 2 weeks for follow up.     She is assessed to be at alow acute risk of suicide.   Clinical Global Impression - Severity:4= Moderately ill  :Suggested Guidelines= Overt symptomatology causing noticeable, but modest, functional impairment. .    Diagnosis/Diagnoses:     ICD-10-CM ICD-9-CM   1. Obsessive-compulsive disorder, unspecified type  F42.9 300.3   2. Schizoaffective disorder, bipolar type (CMS-HCC)  F25.0 295.70        Plan:  Medication Management: Risks, benefits,  and alternatives of medications were discussed today with and she consents to regimen.  #Psychosis:   fluphenazine 10 mg PO qDaily   Continue aripiprazole 15 mgPOBID   Continue ziprasidone 80 mg PO BID w/ food  #EPS   Change totrihexyphenidyl(Artane)3 mg POQHSfor EPS, from 1 mg PO TID  # Anxiety, OCD   Continue gabapentin 1200 mgPO QHS foranxiety and insomnia   Continue desvenlafaxine 50 mgqDaily   Continue bupropion XL 150 mg PO qDaily    Julie Monroe was seen and examined with attending physician, Dr. , who was present and participated in the key and critical portions of the exam. We jointly formulated the assessment and plan, discussed the patient's case, and agree with the findings as documented above.    Note Author:  Wynona Canes, MD  Resident Physician

## 2019-08-08 NOTE — Telephone Encounter (Signed)
Pt would plz like the Dr to call her regarding the issues she been having.

## 2019-08-12 ENCOUNTER — Telehealth: Payer: Self-pay | Admitting: Psychologist

## 2019-08-12 NOTE — Telephone Encounter (Signed)
Patient was referred by Dr [name] to scheduled with Dr Carrolyn Meiers and the patient would like to have  A call back at [phone] to leave a detail message best call back time is anytime     Referral number  [id number]

## 2019-08-13 ENCOUNTER — Telehealth: Payer: Self-pay

## 2019-08-13 NOTE — Telephone Encounter (Signed)
Set for 8/31 @ 9:30 with Dr. Parks Ranger

## 2019-08-13 NOTE — Telephone Encounter (Signed)
Patient referred to Dr. Parks Ranger for neuropsych testing by Dr [name]  .  Offered  8/31  @  9:30   .  Patient to be seen at [facility]. gdate [id number]

## 2019-08-15 ENCOUNTER — Ambulatory Visit: Payer: Medicare (Managed Care) | Admitting: Internal Medicine

## 2019-08-15 NOTE — Telephone Encounter (Signed)
LVM scheduled at 7/19 at 415pm

## 2019-08-16 ENCOUNTER — Other Ambulatory Visit: Payer: Self-pay | Admitting: Psychiatry

## 2019-08-16 ENCOUNTER — Encounter: Payer: Self-pay | Admitting: Student in an Organized Health Care Education/Training Program

## 2019-08-16 DIAGNOSIS — F250 Schizoaffective disorder, bipolar type: Secondary | ICD-10-CM

## 2019-08-16 NOTE — Progress Notes (Signed)
Pt called call center with question about OAB medications. Currently on myrbetriq and oxybutynin. Continues to have some sx of OAB, but improved with medications. No recent changes to OAB sx. Pt concerned as to whether she should continue medications or not and would like to know when she will be able to followup with Dr. Cecile Hearing. Advised pt to continue medications as she is having relief from sx and not experiencing significant adverse effects from medication. Explained to pt that per review of chart she is scheduled to have another appt w/ Dr Cecile Hearing 7/26. Patient expressed understanding.

## 2019-08-17 MED ORDER — ARIPIPRAZOLE 15 MG OR TABS
ORAL_TABLET | ORAL | 3 refills | Status: DC
Start: 2019-08-17 — End: 2019-11-03

## 2019-08-19 ENCOUNTER — Ambulatory Visit: Payer: Medicare (Managed Care) | Admitting: Internal Medicine

## 2019-08-19 ENCOUNTER — Telehealth: Payer: Self-pay | Admitting: Ophthalmology

## 2019-08-19 NOTE — Telephone Encounter (Signed)
Patient requesting a call back from Dr Malachi Pro office to further review treatment plan discussed during 5/24 visit. Patient has questions regarding chronic dry eye condition and has declined scheduling another appointment.

## 2019-08-19 NOTE — Telephone Encounter (Signed)
Patient states her overactive situation has gotten worst.   Currently patient is scheduled for 07/26. She would like to take 07/19 4:15 pm in person visit preferred.     She would like to speak to nurse for medical advice and to know if she needs to continue taking medication.

## 2019-08-20 ENCOUNTER — Ambulatory Visit: Payer: Medicare (Managed Care) | Admitting: Internal Medicine

## 2019-08-21 NOTE — Telephone Encounter (Signed)
Scheduled appointment for 7/19

## 2019-08-27 ENCOUNTER — Telehealth: Payer: Self-pay | Admitting: Internal Medicine

## 2019-08-27 ENCOUNTER — Telehealth: Payer: Self-pay | Admitting: Urology

## 2019-08-27 DIAGNOSIS — H04123 Dry eye syndrome of bilateral lacrimal glands: Secondary | ICD-10-CM

## 2019-08-27 NOTE — Telephone Encounter (Signed)
Hilda Lias, Please assist. Last saw Dr. Delice Lesch 08/27/2018.  Thank you.

## 2019-08-27 NOTE — Telephone Encounter (Addendum)
erroneous message

## 2019-08-27 NOTE — Telephone Encounter (Signed)
Julie Monroe called requesting referral for BARIATRICS FOLLOW UP VISIT. Appt has been scheduled for 09-16-19. Please follow up, thank you.

## 2019-08-27 NOTE — Telephone Encounter (Signed)
Per patient she would like to know if her appt for 09/01/2019 could be converted to a telemedicine appt. Please assist thank you

## 2019-08-28 NOTE — Telephone Encounter (Signed)
Patient checking status of message regarding apt conversion.    Pease note: Berkley Harvey is required for appointment     Please assist.    Thank you

## 2019-09-01 ENCOUNTER — Telehealth: Payer: Medicare (Managed Care) | Admitting: Urology

## 2019-09-01 ENCOUNTER — Encounter: Payer: Self-pay | Admitting: Urology

## 2019-09-01 ENCOUNTER — Encounter: Payer: Self-pay | Admitting: Internal Medicine

## 2019-09-01 DIAGNOSIS — N3941 Urge incontinence: Secondary | ICD-10-CM

## 2019-09-01 MED ORDER — MIRABEGRON ER 50 MG PO TB24
50.0000 mg | ORAL_TABLET | Freq: Every day | ORAL | 3 refills | Status: DC
Start: 2019-09-01 — End: 2020-01-26

## 2019-09-01 NOTE — Progress Notes (Signed)
Due to COVID-19 pandemic and a federally declared state of public health emergency, this telemedicine visit was conducted Audio Only. Total time spent was 11-20 minutes.     Patient confirmed location in [place] and consented to telemedicine visit      Urology Follow Up Note    HISTORY OF PRESENT ILLNESS:  Julie Monroe is a 74 year old-year-old woman here for follow up. She has a history ofurgency urinary incontinence. Cystoscopy on12/16/19demonstrated no lesions. She has been treated withoxybutyninand with PTNS x 4 sessions.She was doing well for some time after the PTNS, but she stopped the treatments because she did not feel it was helping. Then her symptoms started to worsen again with urgency and urgency incontinence.She did not wish to proceed with third line therapy so we started dual medical therapy with myrbetriq and oxybutynin. She then stopped the myrbetriq because she was doing well without it, but then changed and started taking myrbetriq on its own.    Today she reports she had started back on dual medical therapy but then changed back to myrbetriq only. She does continue to have leakage but it is better than without medication.         Current Outpatient Medications:     ammonium lactate (AMLACTIN) 12 % lotion, Apply 1 Application topically 2 times daily. Rub in to affected area well for dry skin, Disp: 1 bottle, Rfl: 11    ARIPiprazole (ABILIFY) 15 MG tablet, TAKE 1 TABLET(15 MG) BY MOUTH IN THE MORNING AND AT BEDTIME, Disp: 60 tablet, Rfl: 3    aspirin 81 MG tablet, Take 81 mg by mouth daily., Disp: , Rfl:     atorvastatin (LIPITOR) 40 MG tablet, Take 1 tablet (40 mg) by mouth daily., Disp: 90 tablet, Rfl: 3    BIOTIN PO, Take 1 capsule by mouth daily., Disp: , Rfl:     Biotin w/ Vitamins C & E (HAIR/SKIN/NAILS PO), , Disp: , Rfl:     buPROPion (WELLBUTRIN XL) 150 MG XL tablet, Take 1 tablet (150 mg) by mouth every morning., Disp: 90 tablet, Rfl: 3    Calcium Carbonate 600 MG tablet, Take  600 mg by mouth 2 times daily., Disp: , Rfl:     Calcium Carbonate-Vit D-Min (CALCIUM 1200 PO), Take 1 tablet by mouth daily., Disp: , Rfl:     celecoxib (CELEBREX) 100 MG capsule, Take 1 capsule (100 mg) by mouth 2 times daily., Disp: 60 capsule, Rfl: 1    Cholecalciferol (VITAMIN D3) 50 MCG (2000 UT) capsule, Take 1 capsule by mouth daily., Disp: , Rfl:     Clobetasol Propionate (IMPOYZ) 0.025 % CREA, Apply topically., Disp: , Rfl:     desvenlafaxine (PRISTIQ) 50 MG TB24, Take 1 tablet (50 mg) by mouth daily., Disp: 90 tablet, Rfl: 3    diclofenac (VOLTAREN) 1 % gel, Apply 4 g topically every 8 hours as needed (pain)., Disp: 1 Tube, Rfl: 0    econazole (SPECTAZOLE) 1 % cream, Apply 1 Application topically 2 times daily. Use a small amount as directed, Disp: 1 Tube, Rfl: 1    fluPHENAZine (PROLIXIN) 10 MG tablet, Take 1 tablet (10 mg) by mouth daily., Disp: 90 tablet, Rfl: 0    gabapentin (NEURONTIN) 600 MG tablet, Take 2 tablets (1,200 mg) by mouth at bedtime., Disp: 180 tablet, Rfl: 3    levothyroxine (SYNTHROID) 137 MCG tablet, TK 1 T PO D, Disp: , Rfl:     lisinopril (PRINIVIL, ZESTRIL) 2.5 MG tablet, TAKE 1 TABLET(2.5  MG) BY MOUTH DAILY, Disp: 90 tablet, Rfl: 3    mirabegron (MYRBETRIQ) 50 MG ER tablet, Take 50 mg by mouth daily., Disp: 90 tablet, Rfl: 3    nystatin (MYCOSTATIN) 100000 UNIT/GM powder, Apply 1 Application topically 3 times daily. Apply to affected area, Disp: 60 g, Rfl: 3    orlistat (XENICAL) 120 MG capsule, Take 1 capsule (120 mg) by mouth 3 times daily (with meals)., Disp: 90 capsule, Rfl: 3    oxybutynin (DITROPAN XL) 10 MG Controlled-Release tablet, Take 1 tablet (10 mg) by mouth daily., Disp: 90 tablet, Rfl: 3    sertraline (ZOLOFT) 100 MG tablet, Take 100 mg by mouth daily., Disp: , Rfl:     triamcinolone (KENALOG) 0.1 % ointment, Use twice a day as needed for hand eczema, Disp: 80 g, Rfl: 2    trihexyphenidyl (ARTANE) 2 MG tablet, Take 1.5 tablets (3 mg) by mouth  nightly., Disp: 135 tablet, Rfl: 3    ziprasidone (GEODON) 80 MG capsule, Take 1 capsule (80 mg) by mouth 2 times daily (with meals)., Disp: 180 capsule, Rfl: 3    Past Medical History:   Diagnosis Date    Bipolar affect, depressed (CMS-HCC)     Chronic back pain     Hypothyroidism     Insomnia     OAB (overactive bladder)     OCD (obsessive compulsive disorder)     Osteopenia     Pure hypercholesterolemia 10/10/2018    Schizophrenia (CMS-HCC)        No Known Allergies    ASSESSMENT:  Julie Monroe is a 74 year old woman with urgency urinary incontinence.      PLAN:  Medications were reviewed with the patient today I advised her that it is okay to stay on Myrbetriq alone.  And she is in between Myrbetriq and oxybutynin I recommend Myrbetriq due to decreased risk of adverse side effects.  We also discussed options including Botox, sacral neuromodulation, and PTNS. PTNS did not work the past but she only did 4 sessions.  Regardless she does not want to try again at this time.  The other options do not appeal to her.  I will see her back in 1 year, sooner if needed.

## 2019-09-01 NOTE — Telephone Encounter (Signed)
Pt had appointment today no further action required at this time.

## 2019-09-01 NOTE — Telephone Encounter (Signed)
Hi Sirena, thanks for teeing up.  Please let pt know that the order is signed.  Thank you.    Reita Cliche

## 2019-09-01 NOTE — Telephone Encounter (Signed)
Patient is requesting a call back from [place]. Patient would like a referral to a different ophthalmologist that specializes in dry eyes. Patient states new referral is needed for insurance authorization. Please call back and assist, thank you.

## 2019-09-01 NOTE — Telephone Encounter (Signed)
Called and spoke with patient and patient's spouse. Informed him that since patient has seen Dr. Loann Quill, his office will placed the follow up referral. Spouse states understanding and then asked for another referral for opthalmology for dry eyes. States she has been seeing Dr Eben Burow for cataract but would now like to see Dr. Thurmond Butts for the Dry eyes.

## 2019-09-02 ENCOUNTER — Ambulatory Visit: Payer: Medicare (Managed Care) | Admitting: Internal Medicine

## 2019-09-02 NOTE — Telephone Encounter (Addendum)
Called and spoke with patient and patient husband. Informed them that referral has been placed and to call back if they do not hear from anyone in 2 weeks. Nothing further needed at this time

## 2019-09-04 ENCOUNTER — Telehealth: Payer: Self-pay | Admitting: Internal Medicine

## 2019-09-04 NOTE — Telephone Encounter (Signed)
Patients husband is calling in regarding f/u appt with Dr Loann Quill. Arlys John is requesting for Dr. Loann Quill to request and auth for f/u appt with Dr. Loann Quill on 08/03. Please request auth prior to appt. And call him back with a status update. Thank you.

## 2019-09-04 NOTE — Telephone Encounter (Signed)
Pt wants to talk to the nurse reg her appt w Dr. Loann Quill on 09/16/19. Please call her back to confirm. Thank you

## 2019-09-05 NOTE — Telephone Encounter (Signed)
09/05/2019     Referral for Bariatric follow up placed and submitted to [employer]-   Pt called and notified -       Virgel Bouquet, RN

## 2019-09-08 ENCOUNTER — Telehealth: Payer: Self-pay | Admitting: Urology

## 2019-09-08 NOTE — Telephone Encounter (Signed)
The pt is calling to speak to someone to assist her with some questions she has about her care for her appt with [facility]. She is concerned about her Dry eye and needs to speak to someone about what can be done for this. Shes seen Dr. Eben Burow but is interested in seeing Dr. Thurmond Butts. Called [place] and spoke to [place] who assisted with connecting the pt to Marisue Ivan  Thank you

## 2019-09-09 NOTE — Telephone Encounter (Signed)
Message to front office to please assist with scheduling future appt with Dr. Thurmond Butts along with requesting auth.      Spoke to pt, per Dr. Eben Burow note on 07/07/2019  Cataract/dry eye and MGD OU.   KCS/MGD  Under-treated  Add PFAT  Add LH  Add omega3FA    Per pt she was not told any of this by Dr. Eben Burow at her last visit. Per pt no happy and would like to request future appts with Dr. Thurmond Butts

## 2019-09-15 ENCOUNTER — Telehealth: Payer: Self-pay | Admitting: Ob/Gyn

## 2019-09-15 ENCOUNTER — Telehealth: Payer: Self-pay | Admitting: Internal Medicine

## 2019-09-15 DIAGNOSIS — Z712 Person consulting for explanation of examination or test findings: Secondary | ICD-10-CM

## 2019-09-15 NOTE — Telephone Encounter (Signed)
Please request authorization for follow up visit to [facility] insurance, please assist.   GYN follow up visit scheduled for 10/22/2019.

## 2019-09-15 NOTE — Telephone Encounter (Signed)
Spoke with patient and reminded her that she has already established with Dr. Learta Codding for [facility] and for this lesion back in April.  States she wants to follow up with her on the same lesion.  Phone number provided for [facility] and informed her to request f/u referral be submitted by Dr. Learta Codding whom she has already established with.  She verbalized understanding and was amenable and appreciative.

## 2019-09-15 NOTE — Telephone Encounter (Signed)
Requesting call regarding referral to OB/GYN   Thank you

## 2019-09-16 ENCOUNTER — Ambulatory Visit: Payer: Medicare (Managed Care) | Attending: Internal Medicine | Admitting: Internal Medicine

## 2019-09-16 ENCOUNTER — Encounter: Payer: Self-pay | Admitting: Internal Medicine

## 2019-09-16 ENCOUNTER — Other Ambulatory Visit
Admission: RE | Admit: 2019-09-16 | Discharge: 2019-09-16 | Disposition: A | Discharge status: Home / Self Care | Payer: Medicare (Managed Care) | Source: Ambulatory Visit | Attending: Internal Medicine | Admitting: Internal Medicine

## 2019-09-16 ENCOUNTER — Ambulatory Visit (HOSPITAL_BASED_OUTPATIENT_CLINIC_OR_DEPARTMENT_OTHER)
Admit: 2019-09-16 | Discharge: 2019-09-16 | Disposition: A | Discharge status: Home Health | Payer: Medicare (Managed Care) | Source: Ambulatory Visit | Attending: Internal Medicine | Admitting: Internal Medicine

## 2019-09-16 DIAGNOSIS — E039 Hypothyroidism, unspecified: Secondary | ICD-10-CM

## 2019-09-16 DIAGNOSIS — Z7182 Exercise counseling: Secondary | ICD-10-CM | POA: Insufficient documentation

## 2019-09-16 DIAGNOSIS — E782 Mixed hyperlipidemia: Secondary | ICD-10-CM

## 2019-09-16 DIAGNOSIS — F250 Schizoaffective disorder, bipolar type: Secondary | ICD-10-CM | POA: Insufficient documentation

## 2019-09-16 DIAGNOSIS — R7989 Other specified abnormal findings of blood chemistry: Secondary | ICD-10-CM | POA: Insufficient documentation

## 2019-09-16 DIAGNOSIS — M25551 Pain in right hip: Secondary | ICD-10-CM | POA: Insufficient documentation

## 2019-09-16 DIAGNOSIS — M1990 Unspecified osteoarthritis, unspecified site: Secondary | ICD-10-CM | POA: Insufficient documentation

## 2019-09-16 DIAGNOSIS — R635 Abnormal weight gain: Secondary | ICD-10-CM

## 2019-09-16 DIAGNOSIS — Z6841 Body Mass Index (BMI) 40.0 and over, adult: Secondary | ICD-10-CM | POA: Insufficient documentation

## 2019-09-16 DIAGNOSIS — R262 Difficulty in walking, not elsewhere classified: Secondary | ICD-10-CM

## 2019-09-16 DIAGNOSIS — M1612 Unilateral primary osteoarthritis, left hip: Secondary | ICD-10-CM

## 2019-09-16 DIAGNOSIS — R102 Pelvic and perineal pain: Secondary | ICD-10-CM

## 2019-09-16 DIAGNOSIS — Z713 Dietary counseling and surveillance: Secondary | ICD-10-CM | POA: Insufficient documentation

## 2019-09-16 LAB — HEMOGRAM, BLOOD
Hematocrit: 38.3 % (ref 34.0–44.0)
Hgb: 13 G/DL (ref 11.5–15.0)
MCH: 29.9 PG (ref 27.0–33.5)
MCHC: 34 G/DL (ref 32.0–35.5)
MCV: 88 FL (ref 81.5–97.0)
MPV: 10.2 FL (ref 7.2–11.7)
PLT Count: 176 10*3/uL (ref 150–400)
RBC: 4.36 10*6/uL (ref 3.70–5.00)
RDW-CV: 13.3 % (ref 11.6–14.4)
White Bld Cell Count: 5.7 10*3/uL (ref 4.0–10.5)

## 2019-09-16 LAB — COMPREHENSIVE METABOLIC PANEL, BLOOD
ALT: 13 U/L (ref 7–52)
AST: 15 U/L (ref 13–39)
Albumin: 4.2 G/DL (ref 3.7–5.3)
Alk Phos: 102 U/L (ref 34–104)
BUN: 18 mg/dL (ref 7–25)
Bilirubin, Total: 0.7 mg/dL (ref 0.0–1.4)
CO2: 27 mmol/L (ref 21–31)
Calcium: 9.7 mg/dL (ref 8.6–10.3)
Chloride: 104 mmol/L (ref 98–107)
Creat: 0.7 mg/dL (ref 0.6–1.2)
Electrolyte Balance: 7 mmol/L (ref 2–12)
Glucose: 109 mg/dL (ref 85–125)
Potassium: 4.1 mmol/L (ref 3.5–5.1)
Protein, Total: 7 G/DL (ref 6.0–8.3)
Sodium: 138 mmol/L (ref 136–145)
eGFR - high estimate: >60 (ref >59)
eGFR - low estimate: >60 (ref >59)

## 2019-09-16 LAB — TSH, BLOOD: TSH, Ultrasensitive: 0.338 u[IU]/mL — ABNORMAL LOW (ref 0.450–4.120)

## 2019-09-16 LAB — FREE THYROXINE, BLOOD: Free T4: 1.2 ng/dL — ABNORMAL HIGH (ref 0.60–1.12)

## 2019-09-16 MED ORDER — ORLISTAT 120 MG OR CAPS
120.0000 mg | ORAL_CAPSULE | Freq: Three times a day (TID) | ORAL | 0 refills | Status: AC
Start: 2019-09-16 — End: 2020-09-10

## 2019-09-16 MED ORDER — ORLISTAT 120 MG OR CAPS
120.0000 mg | ORAL_CAPSULE | Freq: Three times a day (TID) | ORAL | 3 refills | Status: DC
Start: 2019-09-16 — End: 2019-09-16

## 2019-09-16 NOTE — Progress Notes (Signed)
Bariatric Medicine consultation  PCP: Leonia Reader  Referred by: Reita Cliche Sasson      Date:09/16/2019    Julie Monroe is a 74 year old female with hx of schizoaffective disorder, osteoarthritis s/p bilateral TKA, HLD, and hypothyroidism who is here bariatrics follow up, last seen on 05/13/2019    Wt 280 previously in August 2020, currently 262 lb today.     Discussed intermittent fasting at previous visit.  Patient reports that she doesn't eat 3 meals a day has 1 big meal around 3:00pm every day. Has not been on orlistat since trying it one time, no side effects. Not currently interested in bariatric surgery     Diet  Buys food from food banks. Groceries include, milk, fruit, diet coke, vegetables, meats. Sometimes tempted to buy snacks such as chocolate cake at the food bank.    Used to drink large diet cokes multiple times of day. Now down to 1-2 bottles of diet coke a day. Sometimes goes through days where she does not drink diet coke.   States that she is not overeating/binging as she used to, trying to eat more salads and fruits. Cutting down on snacks desserts and sweets. Eats cookies or chocolate cake, 2-3 times a week. Did not try intermittent fasting yet, but is interested in starting.     States orlistat does not work with her life style because you have to take it 30 mins before meals.  She eats 1 meal a day.  3:00pm she eats her main meal which ranges from: pasta, acorn squash, chicken leg, breads, and butter  She binged bread and butter recently.   She usually snacks before sleeping around 10:00pm which consists of cereal, chocolate cake, or cookies    Exercise  - Still not exercising, limited primarily due motivation. Previously discussed aiming for 30 minutes of walking daily. Previously was walking up to 5 miles daily but has been quite sedentary recently. Interested in walking for 10 minutes outside everyday.     #Schizoaffective disorder, bipolar type  #Anxiety  - Currently on several antipsychotics  including fluphenazine, ziprasidone, aripiprazole. Following [place] psych, mood stable   - On wellbutrin, desvenlafaxine and gabapentin    #Hypothyroidism  Last TSH in Oct/2020 wnl, denies any temperature sensitivity     #Osteoarthritis s/p bilateral TKA  - Denies any knee pain currently    Past Medical and Surgical History:  Past Medical History:   Diagnosis Date    Bipolar affect, depressed (CMS-HCC)     Chronic back pain     Hypothyroidism     Insomnia     OAB (overactive bladder)     OCD (obsessive compulsive disorder)     Osteopenia     Pure hypercholesterolemia 10/10/2018    Schizophrenia (CMS-HCC)      Past Surgical History:   Procedure Laterality Date    Biilat. Knee replacement      Rt toe surgery      TOTAL KNEE ARTHROPLASTY Bilateral        Allergies:  No Known Allergies    Medications:  Current Outpatient Medications   Medication Sig Dispense Refill    ARIPiprazole (ABILIFY) 15 MG tablet TAKE 1 TABLET(15 MG) BY MOUTH IN THE MORNING AND AT BEDTIME 60 tablet 3    atorvastatin (LIPITOR) 40 MG tablet Take 1 tablet (40 mg) by mouth daily. 90 tablet 3    buPROPion (WELLBUTRIN XL) 150 MG XL tablet Take 1 tablet (150 mg) by mouth every morning. 90 tablet  3    Cholecalciferol (VITAMIN D3) 50 MCG (2000 UT) capsule Take 1 capsule by mouth daily.      Clobetasol Propionate (IMPOYZ) 0.025 % CREA Apply topically.      desvenlafaxine (PRISTIQ) 50 MG TB24 Take 1 tablet (50 mg) by mouth daily. 90 tablet 3    fluPHENAZine (PROLIXIN) 10 MG tablet Take 1 tablet (10 mg) by mouth daily. 90 tablet 0    gabapentin (NEURONTIN) 600 MG tablet Take 2 tablets (1,200 mg) by mouth at bedtime. 180 tablet 3    levothyroxine (SYNTHROID) 137 MCG tablet TK 1 T PO D      lisinopril (PRINIVIL, ZESTRIL) 2.5 MG tablet TAKE 1 TABLET(2.5 MG) BY MOUTH DAILY 90 tablet 3    mirabegron (MYRBETRIQ) 50 MG ER tablet Take 50 mg by mouth daily. 90 tablet 3    nystatin (MYCOSTATIN) 100000 UNIT/GM powder Apply 1 Application topically 3  times daily. Apply to affected area 60 g 3    trihexyphenidyl (ARTANE) 2 MG tablet Take 1.5 tablets (3 mg) by mouth nightly. 135 tablet 3    ziprasidone (GEODON) 80 MG capsule Take 1 capsule (80 mg) by mouth 2 times daily (with meals). 180 capsule 3     No current facility-administered medications for this visit.       Social History:  Social History     Socioeconomic History    Marital status: Married     Spouse name: Not on file    Number of children: Not on file    Years of education: Not on file    Highest education level: Not on file   Occupational History    Not on file   Tobacco Use    Smoking status: Former Smoker     Packs/day: 2.00     Years: 30.00     Pack years: 60.00    Smokeless tobacco: Never Used   Substance and Sexual Activity    Alcohol use: No    Drug use: No    Sexual activity: Never   Social Activities of Daily Living Present    Not on file   Social History Narrative    Not on file       Family History:  Family History   Problem Relation Name Age of Onset    Other Mother          Wegener    Cancer Brother          Leukemia    Cancer Father          Leukemia    Heart Disease Father      Hypertension Father      No Known Problems Sister      Cancer Brother          Bladder Cancer       Review of Systems:  Positive review of systems bolded  Constitutional:  Fever, chills, weight loss, weight gain  Head & Neck:  Headache, vision change, hearing change, rhinorrhea, sore throat  Respiratory:  Shortness of breath, cough, sputum, hemoptysis  Cardiovascular:  Chest pain/pressure, orthopnea, PND, palpitations  Gastrointestinal:  Abdominal pain, nausea, vomiting, diarrhea, melena, rectal bleeding  Genitourinary:  Dysuria, polyuria, hesitancy, frequency  Musculoskeletal:  Joint pain, leg edema  Neurological:  Focal numbness, weakness, seizure      Physical Exam:   Vital Signs: BP 111/58 (BP Location: Left arm, BP Patient Position: Sitting, BP cuff size: Regular)    Pulse 74    Temp 97.6  F (36.4 C) (Temporal)    Resp 17    Ht 5\' 6"  (1.676 m)    Wt 118.9 kg (262 lb 0.3 oz)    LMP  (LMP Unknown)    SpO2 97%    BMI 42.29 kg/m   General:  Obese female in NAD   HEENT:  PERRL, EOMI, clear oropharynx, no LAD  Neck:   supple, no JVD  Lungs:  CTAB, no crackles or wheezing      Heart:  RRR, normal S1/S2, no murmurs  Abdomen:  soft, non-distended, non-tender, normo-active bowel sounds, no palpable masses  Extremities:  2+ pulses in lower extremities, no lower extremity edema  Neurological:  alert and oriented x 4, motor strength and sensation grossly intact, some resting tremor in hands  Skin: warm and dry, no rashes or lesions     Labs and Other Data:    Glycated Hgb, A1C   Date Value Ref Range Status   07/25/2019 5.0 4.6 - 5.6 % Final     Comment:     (NOTE)  Reference values for HgA1c:        Non-Diabetic: 4.6 - 5.6%    HbA1c cutoffs for assessing diabetes:      Increased risk for diabetes: 5.7 - 6.4%      Diabetes:  >6.4%    ADA recommended HbA1c goals in treatment of diabetes:      Adults: <7.0%      Children and adolescents with type I Diabetes: <7.5%      Comment: A lower goal of <7.0% is reasonable if it      can be achieved without excessive hypoglycemia.      Goals should be individualized.    Reference: Diabetes Care January 2017.         TSH   Date Value Ref Range Status   11/26/2018 0.86 0.40 - 4.50 mIU/L Final   ; fT4:   Free T4   Date Value Ref Range Status   09/16/2019 1.20 (H) 0.60 - 1.12 ng/dL Final     Comment:     (NOTE)  Patients taking high dose biotin (vitamin B7) may show erroneously   high results. High dose biotin supplements should be discontinued a   minimum of 24 hours prior to testing.          Assessment and Care Plan:    Lillard AnesChristine Hefter is a 74 year old female with hx of schizoaffective disorder, osteoarthritis s/p bilateral TKA, HLD, and hypothyroidism who is here bariatrics follow up    #Morbid obesity - BMI 42   Weight downtrending to 262 from 280 lb in August 2020.  Comorbidities include HLD and osteoarthritis. Minimal exercise, however with some dietary changes from prior. Atypical antipsychotics likely also contributing to weight. Not interested in bariatric surgery  - Discussed starting intermittent fasting, patient interested in trying  - Discussed aiming for at least 10 minutes of walking daily, with goal of increasing to 30 minutes daily  - On Orlistat, but difficult for her to take before meals. Encouraged to take 30 minutes before her primary meal of the day.     #Schizoaffective disorder, bipolar type  #Anxiety  Mood stable, medications managed per psych     #HLD  The 10-year ASCVD risk score Denman George(Goff DC Jr., et al., 2013) is: 7.2%    Values used to calculate the score:      Age: [age] years      Sex: Female  Is Non-Hispanic African American: No      Diabetic: No      Tobacco smoker: No      Systolic Blood Pressure: 111 mmHg      Is BP treated: No      HDL Cholesterol: 69 MG/DL      Total Cholesterol: 147 MG/DL  - Continue atorvastatin 10 mg daily     #Hypothyroidism  Last TSH in Oct/2020 wnl  - Continue synthroid 137 mcg     #Osteoarthritis s/p bilateral TKA  Stable, not limiting movement     Follow up in 3 months     Future Appointments   Date Time Provider Department Center   05/13/2019  1:50 PM Meridee Score, MD [place] [id number]   06/06/2019  1:30 PM Park, Caren Macadam, MD [facility]   07/01/2019 11:00 AM Test, [facility] [place]   07/07/2019 11:00 AM Lona Millard, MD [facility]   07/29/2019 10:20 AM Leonia Reader, MD [place] [facility] [place]       Patient Instructions   Thank you for visiting our clinic today    You are making great process, keep up the great work!    Please adjust your eating schedule as we discussed in clinic today:    -8:00am have some black coffee or unsweetened tea when you wake up   -10:00am have a light snack, fruits, vegetables, try to stay away from sugary treats   -2:30pm please take  the orlistat   -3:00pm have your meal. Try to incorporate a salad and fruits   -7:00pm do not eat after this time. You can have unsweetened teas if you would like.     Please drink up to 10 cups of water a day. Please refrain drom drinking from diet soda.   Please take a walk everyday outside for 10 minutes a day    Follow up at our clinic in 3 months       Rosilyn Mings, MD  Internal Medicine, PGY-1    Voice Recognition Disclaimer - Due to possible errors in transcription, there may be errors in documentation: Due to voice recognition software, sound alike and misspelled words may be contained in the documentation. Portions of this chart may have been created with M-Modal Fluency Direct Voice Recognition Software. Occasional wrong-word or sound-alike substitutions may have occurred due to the inherent limitations of voice recognition software. Please read the chart carefully and recognize, using context, where these substitutions may have occurred.           I have seen and examined patient along with the resident physician,  I personally reviewed the diagnostic studies and agree with the interpretation as documented above.  The assessment and plan was jointly formulated after discussion with the resident and patinet.  The resident then entered the history, exam, and decision making based on our joint evaluations.  I have reviewed and agree with the documentation above and have edited corrections to the documentation as necessary.      I have answered questions, reviewed plan of care with patient and/or family and they agree with the plan of care as documented.    B.Yaslin Kirtley, MD

## 2019-09-16 NOTE — Addendum Note (Signed)
Addended by: Tyreesha Maharaj M on: 09/16/2019 10:06 AM     Modules accepted: Orders

## 2019-09-16 NOTE — Progress Notes (Signed)
Patient walked in for xray ordered in June, but order expired.  Order transcribed now.

## 2019-09-16 NOTE — Addendum Note (Signed)
Addended by: Arvil Persons on: 09/16/2019 10:06 AM     Modules accepted: Orders

## 2019-09-16 NOTE — Telephone Encounter (Signed)
09/16/19 follow up order placed. AM

## 2019-09-16 NOTE — Patient Instructions (Addendum)
Thank you for visiting our clinic today    You are making great process, keep up the great work!    Please adjust your eating schedule as we discussed in clinic today:    -8:00am have some black coffee or unsweetened tea when you wake up   -10:00am have a light snack, fruits, vegetables, try to stay away from sugary treats   -2:30pm please take the orlistat   -3:00pm have your meal. Try to incorporate a salad and fruits   -7:00pm do not eat after this time. You can have unsweetened teas if you would like.     Please drink up to 10 cups of water a day. Please refrain drom drinking from diet soda.   Please take a walk everyday outside for 10 minutes a day    Follow up at our clinic in 3 months

## 2019-09-17 LAB — GLYCOSYLATED HGB(A1C), BLOOD: Glycated Hgb, A1C: 5.3 % (ref 4.6–5.6)

## 2019-09-22 ENCOUNTER — Telehealth: Payer: Self-pay

## 2019-09-22 DIAGNOSIS — I4510 Unspecified right bundle-branch block: Secondary | ICD-10-CM

## 2019-09-24 ENCOUNTER — Telehealth: Payer: Self-pay | Admitting: Internal Medicine

## 2019-09-24 ENCOUNTER — Encounter: Payer: Self-pay | Admitting: Student in an Organized Health Care Education/Training Program

## 2019-09-24 ENCOUNTER — Ambulatory Visit
Payer: Medicare (Managed Care) | Attending: Internal Medicine | Admitting: Student in an Organized Health Care Education/Training Program

## 2019-09-24 VITALS — BP 140/74 | HR 74 | Ht 66.0 in | Wt 265.4 lb

## 2019-09-24 DIAGNOSIS — E039 Hypothyroidism, unspecified: Secondary | ICD-10-CM | POA: Insufficient documentation

## 2019-09-24 DIAGNOSIS — Z6841 Body Mass Index (BMI) 40.0 and over, adult: Secondary | ICD-10-CM | POA: Insufficient documentation

## 2019-09-24 NOTE — Telephone Encounter (Signed)
The patient called in to request a call back from [facility] regarding a referral to hip/orthopedics doctor. Thank you.

## 2019-09-24 NOTE — Patient Instructions (Addendum)
1. Stop hair/skin/nail supplement and repeat labs on Monday  2. I will contact you with results and change your prescription as needed  3. Return to clinic in 6 months       Thank you     It was a pleasure to see you today in the office. We value and appreciate your time. If you feel that you received excellent care today, please take a few minutes to complete a survey that may be mailed to your home regarding this visit. We would greatly appreciate it, thank you.

## 2019-09-24 NOTE — Consults (Signed)
ENDOCRINOLOGY OUTPATIENT VISIT - [facility]    Date and Time of Evaluation:  Wednesday September 24, 2019, 10:40 AM    Primary Care Physician:  Charlotte Crumb    Chief Complaint:   Chief Complaint   Patient presents with    New Patient       History of Present Illness:    This is a 74 year old female here for evaluation and management of hypothyroidism.     Diagnosed 15 years ago on labs  Currently on 137 mcg, has been on this dose for about a year    Most recent labs with TSH 0.33 and FT4 1.2  Does take hair/skin/nails supplement - 5000 mcg biotin daily    Has noticed some hair loss over last few months  Energy improved, no changes in sleep  No feeling anxious. Has chronic tremor  No palpitations  Has had some intentional weight loss  No diarrhea or constipation  Always cold      Past Medical/Surgical History:  Past Medical History:   Diagnosis Date    Bipolar affect, depressed (CMS-HCC)     Chronic back pain     Hypothyroidism     Insomnia     OAB (overactive bladder)     OCD (obsessive compulsive disorder)     Osteopenia     Pure hypercholesterolemia 10/10/2018    Schizophrenia (CMS-HCC)        Family History:  Family History   Problem Relation Age of Onset    Other Mother         Wegener    Cancer Brother         Leukemia    Cancer Father         Leukemia    Heart Disease Father     Hypertension Father     No Known Problems Sister     Cancer Brother         Bladder Cancer       Social History:  Social History     Tobacco Use    Smoking status: Former Smoker     Packs/day: 2.00     Years: 30.00     Pack years: 60.00    Smokeless tobacco: Never Used   Substance Use Topics    Alcohol use: No    Drug use: No       Allergies:  No Known Allergies    Medications:  Current Outpatient Medications on File Prior to Visit   Medication Sig Dispense Refill    ARIPiprazole (ABILIFY) 15 MG tablet TAKE 1 TABLET(15 MG) BY MOUTH IN THE MORNING AND AT BEDTIME 60 tablet 3    atorvastatin (LIPITOR) 40 MG tablet  Take 1 tablet (40 mg) by mouth daily. 90 tablet 3    buPROPion (WELLBUTRIN XL) 150 MG XL tablet Take 1 tablet (150 mg) by mouth every morning. 90 tablet 3    Cholecalciferol (VITAMIN D3) 50 MCG (2000 UT) capsule Take 1 capsule by mouth daily.      Clobetasol Propionate (IMPOYZ) 0.025 % CREA Apply topically.      desvenlafaxine (PRISTIQ) 50 MG TB24 Take 1 tablet (50 mg) by mouth daily. 90 tablet 3    fluPHENAZine (PROLIXIN) 10 MG tablet Take 1 tablet (10 mg) by mouth daily. 90 tablet 0    gabapentin (NEURONTIN) 600 MG tablet Take 2 tablets (1,200 mg) by mouth at bedtime. 180 tablet 3    levothyroxine (SYNTHROID) 137 MCG tablet TK 1 T PO  D      lisinopril (PRINIVIL, ZESTRIL) 2.5 MG tablet TAKE 1 TABLET(2.5 MG) BY MOUTH DAILY 90 tablet 3    mirabegron (MYRBETRIQ) 50 MG ER tablet Take 50 mg by mouth daily. 90 tablet 3    nystatin (MYCOSTATIN) 100000 UNIT/GM powder Apply 1 Application topically 3 times daily. Apply to affected area 60 g 3    orlistat (XENICAL) 120 MG capsule Take 1 capsule (120 mg) by mouth 3 times daily (with meals). 90 capsule 0    trihexyphenidyl (ARTANE) 2 MG tablet Take 1.5 tablets (3 mg) by mouth nightly. 135 tablet 3    ziprasidone (GEODON) 80 MG capsule Take 1 capsule (80 mg) by mouth 2 times daily (with meals). 180 capsule 3     No current facility-administered medications on file prior to visit.     Physical Exam:  Vital Signs: BP 140/74 (BP Location: Right arm, BP Patient Position: Sitting, BP cuff size: Regular)    Pulse 74    Ht '5\' 6"'  (1.676 m)    Wt 120.4 kg (265 lb 6.9 oz)    LMP  (LMP Unknown)    BMI 42.84 kg/m     General:  NAD, WNWD female, appears stated age  33: Neck supple, no thyromegaly or palpable thyroid masses, no LAD  Lungs: Symmetric chest rise, normal work of breathing  Heart: Regular rate  Abdomen:  GU/Rectal:   Extremities: No cce  Neurological: AO x 4   Skin:  no visible lesions    Diagnostic Data:    Laboratory Data:    Lab Results   Component Value Date      WBC 4.6 07/09/2018    HGB 13.0 09/16/2019    HCT 38.3 09/16/2019    PLT 176 09/16/2019    RBC 4.36 09/16/2019    MCV 88.0 09/16/2019    RDW 13.3 09/16/2019    NEUTP 66.9 12/12/2018    NEUTAB 3.9 12/12/2018    EOSAB 0.2 12/12/2018       Lab Results   Component Value Date/Time    SODIUM 138 09/16/2019 10:07 AM    NA 141 11/26/2018 05:12 AM    K 4.1 09/16/2019 10:07 AM    K 4.5 11/26/2018 05:12 AM    CL 104 09/16/2019 10:07 AM    CL 105 11/26/2018 05:12 AM    CO2 27 09/16/2019 10:07 AM    BICARB 30 11/26/2018 05:12 AM    BUN 18 09/16/2019 10:07 AM    BUN 18 11/26/2018 05:12 AM    CREAT 0.7 09/16/2019 10:07 AM    CREAT 0.81 11/26/2018 05:12 AM    GLU 109 09/16/2019 10:07 AM    GLU 90 11/26/2018 05:12 AM    [place] 9.7 09/16/2019 10:07 AM     9.7 11/26/2018 05:12 AM    TPROT 7.0 09/16/2019 10:07 AM    TP 6.9 11/26/2018 05:12 AM    ALB 4.2 09/16/2019 10:07 AM    ALB 4.2 11/26/2018 05:12 AM    ALK 102 09/16/2019 10:07 AM    ALK 101 11/26/2018 05:12 AM    TBILI 0.7 09/16/2019 10:07 AM    TBILI 1.0 11/26/2018 05:12 AM    AST 15 09/16/2019 10:07 AM    AST 15 11/26/2018 05:12 AM    ALT 13 09/16/2019 10:07 AM    ALT 9 11/26/2018 05:12 AM       Lab Results   Component Value Date/Time    A1C 5.3 09/16/2019 10:07 AM  A1C 5.3 11/26/2018 05:12 AM       Lab Results   Component Value Date/Time    TSH 0.86 11/26/2018 05:12 AM    TSHHS 0.338 (L) 09/16/2019 10:07 AM    FREET4 1.20 (H) 09/16/2019 10:07 AM    FREET4 1.3 11/26/2018 05:12 AM       Laboratory Data:      Imaging Data:      Impression:  This is a 74 year old female presenting for evaluation and management of hypothyroidism.     Assessment:    #Hypothyroidism. Dx 15 years ago. Likely Hashimoto's, will obtain antibodies below. Previously stable on levothyroxine 137 mcg daily, recently with labs suggesting overreplacement, though patient does endorse taking 5000 mcg of biotin in her hair/skin/nails supplement. Clinically euthyroid except for increased hair loss recently.    -  Hold hair/skin/nails supplement and repeat labs in 4-5 days. Ok to resume supplement once labs are completed   - Will adjust levothyroxine dose depending on repeat results     Diagnoses and all orders for this visit:    Hypothyroidism, unspecified type  -     TSH, Blood - See Instructions; Future  -     Free Thyroxine, Blood Green Plasma Separator Tube; Future  -     Anti-Thyroid Peroxidase AB (Anti-TPO) Yellow serum separator tube; Future  -     Thyroglobulin Antibody, Blood Yellow serum separator tube; Future    Return to Clinic:  6 months     Vernell Leep, MD

## 2019-09-25 ENCOUNTER — Telehealth: Payer: Self-pay | Admitting: Psychologist

## 2019-09-25 NOTE — Telephone Encounter (Addendum)
Patient is requesting a sooner appointment as she will be out of town the week on 08/31.    8/12 @ 7:15 called patient back; she would like to move 8/31 to next week; explained there are no appointment. She will keep the 8/31 gdate [id number]      8/20 @ 6:!2p patient calls indicating that she would like to cxl 8/31 as she is worried about Covid at this time.  She will call back to reschedule. gdate [id number]

## 2019-09-25 NOTE — Telephone Encounter (Signed)
Ortho hip referral signed, please let pt know.    Thank you.

## 2019-09-25 NOTE — Telephone Encounter (Signed)
Spoke with patient and she does want an ortho referral. Wanted PCP to know that she sees an endocrinologist upstairs here and had an appt yesterday with her, so endo is managing her thyroid.  Very appreciative.

## 2019-09-25 NOTE — Telephone Encounter (Signed)
Called patient informed patient dr [name] placed an referral to Ortho. Patient has an [facility] insurance and is aware referral needs to be approved.  Gave patient [place]. Dept phone number and Ortho. [facility] Na ma

## 2019-09-29 ENCOUNTER — Ambulatory Visit: Payer: Self-pay | Admitting: Cardiovascular Disease

## 2019-09-29 ENCOUNTER — Telehealth: Payer: Self-pay | Admitting: Ophthalmology

## 2019-09-29 NOTE — Telephone Encounter (Signed)
LEFT MESSAGE TO SCHEDULE AS NEEDED PER REQUEST

## 2019-10-03 ENCOUNTER — Telehealth: Payer: Self-pay | Admitting: Cardiovascular Disease

## 2019-10-03 DIAGNOSIS — I350 Nonrheumatic aortic (valve) stenosis: Secondary | ICD-10-CM

## 2019-10-06 ENCOUNTER — Encounter: Payer: Self-pay | Admitting: Cardiovascular Disease

## 2019-10-06 ENCOUNTER — Other Ambulatory Visit
Admission: RE | Admit: 2019-10-06 | Discharge: 2019-10-06 | Disposition: A | Discharge status: Home / Self Care | Payer: Medicare (Managed Care) | Source: Ambulatory Visit | Attending: Student in an Organized Health Care Education/Training Program | Admitting: Student in an Organized Health Care Education/Training Program

## 2019-10-06 ENCOUNTER — Ambulatory Visit (INDEPENDENT_AMBULATORY_CARE_PROVIDER_SITE_OTHER): Payer: Medicare (Managed Care) | Admitting: Cardiovascular Disease

## 2019-10-06 VITALS — HR 87 | Temp 97.7°F | Resp 19 | Ht 66.0 in | Wt 265.0 lb

## 2019-10-06 DIAGNOSIS — E039 Hypothyroidism, unspecified: Secondary | ICD-10-CM | POA: Insufficient documentation

## 2019-10-06 LAB — TSH, BLOOD: TSH, Ultrasensitive: 0.236 u[IU]/mL — ABNORMAL LOW (ref 0.450–4.120)

## 2019-10-06 LAB — THYROGLOBULIN ANTIBODY: Anti Thyroglobulin: <1 IU/ML (ref 0–4)

## 2019-10-06 LAB — ANTI-THYROID PEROXIDASE AB, BLOOD: Anti TPO Abs: 3 IU/ML (ref <9)

## 2019-10-06 LAB — FREE THYROXINE, BLOOD: Free T4: 1.26 ng/dL — ABNORMAL HIGH (ref 0.60–1.12)

## 2019-10-06 MED ORDER — FLUPHENAZINE HCL 5 MG OR TABS
ORAL_TABLET | ORAL | Status: DC
Start: 2019-09-29 — End: 2019-11-03

## 2019-10-06 MED ORDER — LOSARTAN POTASSIUM 25 MG OR TABS
25.0000 mg | ORAL_TABLET | Freq: Every day | ORAL | 3 refills | Status: DC
Start: 2019-10-06 — End: 2020-01-29

## 2019-10-06 NOTE — Patient Instructions (Signed)
It was a pleasure seeing you today. See below for some of the things we discussed.  - Your dry cough is likely related to a side-effect to your blood pressure medications, we can switch to a different medication that would not cause the same side-effect.  - Please have your repeat echocardiogram done in November 2021.    Please let us know if you have any questions.

## 2019-10-06 NOTE — Progress Notes (Signed)
CARDIOLOGY FOLLOW-UP PATIENT VIST    Referring: Charlotte Crumb    HPI:  Julie Monroe is a 74 year old female here for a cardiovascular evaluation.  Patient has history of heart murmur and RBBB.  She had echocardiogram 03/2018 which showed mild aortic valve stenosis.    She had chest pain 10/16 and was evaluated in [facility].  Workup was negative.  No stress test.  No recurrence of chest pain.  Symptoms were atypical. Patient mentioned that her father had MI at age 33 and her older sister has atrial fibrillation. Patient has limited activity due to sheltering due to covid in 05/2019.    Since last visit, patient has had a dry cough after starting Lisinopril. Discussed this is a common side-effect and we can switch to ARB instead. Otherwise tolerating statin.        Past Medical History:  Past Medical History:   Diagnosis Date    Bipolar affect, depressed (CMS-HCC)     Chronic back pain     Hypothyroidism     Insomnia     OAB (overactive bladder)     OCD (obsessive compulsive disorder)     Osteopenia     Pure hypercholesterolemia 10/10/2018    Schizophrenia (CMS-HCC)         Medications:    Current Outpatient Medications:     ARIPiprazole (ABILIFY) 15 MG tablet, TAKE 1 TABLET(15 MG) BY MOUTH IN THE MORNING AND AT BEDTIME, Disp: 60 tablet, Rfl: 3    atorvastatin (LIPITOR) 40 MG tablet, Take 1 tablet (40 mg) by mouth daily., Disp: 90 tablet, Rfl: 3    buPROPion (WELLBUTRIN XL) 150 MG XL tablet, Take 1 tablet (150 mg) by mouth every morning., Disp: 90 tablet, Rfl: 3    Cholecalciferol (VITAMIN D3) 50 MCG (2000 UT) capsule, Take 1 capsule by mouth daily., Disp: , Rfl:     Clobetasol Propionate (IMPOYZ) 0.025 % CREA, Apply topically., Disp: , Rfl:     desvenlafaxine (PRISTIQ) 50 MG TB24, Take 1 tablet (50 mg) by mouth daily., Disp: 90 tablet, Rfl: 3    fluPHENAZine (PROLIXIN) 10 MG tablet, Take 1 tablet (10 mg) by mouth daily., Disp: 90 tablet, Rfl: 0    fluPHENAZine (PROLIXIN) 5 MG tablet, , Disp: ,  Rfl:     gabapentin (NEURONTIN) 600 MG tablet, Take 2 tablets (1,200 mg) by mouth at bedtime., Disp: 180 tablet, Rfl: 3    levothyroxine (SYNTHROID) 137 MCG tablet, TK 1 T PO D, Disp: , Rfl:     lisinopril (PRINIVIL, ZESTRIL) 2.5 MG tablet, TAKE 1 TABLET(2.5 MG) BY MOUTH DAILY, Disp: 90 tablet, Rfl: 3    mirabegron (MYRBETRIQ) 50 MG ER tablet, Take 50 mg by mouth daily., Disp: 90 tablet, Rfl: 3    nystatin (MYCOSTATIN) 100000 UNIT/GM powder, Apply 1 Application topically 3 times daily. Apply to affected area, Disp: 60 g, Rfl: 3    orlistat (XENICAL) 120 MG capsule, Take 1 capsule (120 mg) by mouth 3 times daily (with meals)., Disp: 90 capsule, Rfl: 0    trihexyphenidyl (ARTANE) 2 MG tablet, Take 1.5 tablets (3 mg) by mouth nightly., Disp: 135 tablet, Rfl: 3    ziprasidone (GEODON) 80 MG capsule, Take 1 capsule (80 mg) by mouth 2 times daily (with meals)., Disp: 180 capsule, Rfl: 3    Allergies:  No Known Allergies    Past Surgical History:  Past Surgical History:   Procedure Laterality Date    Biilat. Knee replacement  Rt toe surgery      TOTAL KNEE ARTHROPLASTY Bilateral        Family History:  Family History   Problem Relation Name Age of Onset    Other Mother          Wegener    Cancer Brother          Leukemia    Cancer Father          Leukemia    Heart Disease Father      Hypertension Father      No Known Problems Sister      Cancer Brother          Bladder Cancer       Social History:  Social History     Socioeconomic History    Marital status: Married     Spouse name: Not on file    Number of children: Not on file    Years of education: Not on file    Highest education level: Not on file   Occupational History    Not on file   Tobacco Use    Smoking status: Former Smoker     Packs/day: 2.00     Years: 30.00     Pack years: 60.00    Smokeless tobacco: Never Used   Substance and Sexual Activity    Alcohol use: No    Drug use: No    Sexual activity: Never   Other Topics Concern     Not on file   Social History Narrative    Not on file     Social Determinants of Health     Financial Resource Strain:     Difficulty of Paying Living Expenses:    Food Insecurity:     Worried About Charity fundraiser in the Last Year:     Arboriculturist in the Last Year:    Transportation Needs:     Film/video editor (Medical):     Lack of Transportation (Non-Medical):    Physical Activity:     Days of Exercise per Week:     Minutes of Exercise per Session:    Stress:     Feeling of Stress :    Social Connections:     Frequency of Communication with Friends and Family:     Frequency of Social Gatherings with Friends and Family:     Attends Religious Services:     Active Member of Clubs or Organizations:     Attends Music therapist:     Marital Status:    Intimate Partner Violence:     Fear of Current or Ex-Partner:     Emotionally Abused:     Physically Abused:     Sexually Abused:        Review of Systems:  (+) See HPI  (-) HA, fever, rigors, chills, palpitations, dizziness, syncope, CP, jaw/arm pain/numbness/paraesthesias, SOB/DOE, cough, hemoptysis, extremity swelling, PND, abdominal pain, N&V, BRBPR, melena, dysuria, hematuria, cold/heat intolerance, tremor, balance difficulties     Physical Exam:   Pulse 87    Temp 97.7 F (36.5 C)    Resp 19    Ht '5\' 6"'  (1.676 m)    Wt 120.2 kg (265 lb)    LMP  (LMP Unknown)    SpO2 97%    BMI 42.77 kg/m     General Appearance: Alert, oriented, cooperative, no acute distress.  Obese  HEENT:  Normocephalic, atraumatic, PERRL, conjunctiva/corneas clear  Neck: Supple,  non-tender, normal thyroid, no  carotid bruits or JVD  Lungs: Clear to auscultation bilaterally, no rales or rhonchi  Chest Wall: No tenderness or deformity  Heart: Regular rhythm, normal S1 and S2  2/6 early peaking systolic murmur at the base, no gallop or rub  Abdomen: Soft, non-tende, bowel sounds active, no masses, or organomegaly  Extremities: No cyanosis,  clubbing or edema  Pulses: 2+ and symmetric all extremities  Skin: Skin color, texture, turgor normal, no rashes or lesions  Neurologic: CNII-XII intact, normal strength, otherwise grossly intact.    Cardiology Studies:    Echo 04/08/2018   Summary:   1. Normal left ventricular systolic function. The left ventricular ejection fraction is 58% by Biplane.   2. Mild aortic valve stenosis.   3. Mildly enlarged right ventricle.   4. Left atrium mildly dilated.     ECG INTERPRETATION     Preliminary   SINUS RHYTHM RIGHT BUNDLE BRANCH BLOCK [120+ ms QRS DURATION, UPRIGHT V1, 40+ ms S IN I/aVL/V4/V5/V6] LEFT ANTERIOR FASCICULAR BLOCK [QRS AXIS <= -45, QR IN I, RS IN II] VOLTAGE CRITERIA FOR LVH [MEETS CRITERIA IN ONE OF: R(aVL), S(V1), R(V5), R(V5/V6)+S(V1)] ABNORMAL ECG UNCONFIRMED REPORT     Echo 07/01/2019   Summary:   1. Left atrium mildly dilated.   2. Normal left ventricular systolic function. The left ventricular ejection fraction is 62% by Biplane.   3. Mild to moderate aortic valve stenosis.   4. DI 0.38, AVA 1.2-1.3 cm2 by continuity, mean gradient 24 mmHg, AoV max velocity 3.1 m/s from the apical five chamber.   5. Moderate concentric left ventricular hypertrophy.   6. Left ventricular ejection fraction is 59% by 3D.   7. Moderate diastolic LV dysfunction.         Lab Review:   Lab Results   Component Value Date    SODIUM 138 09/16/2019    K 4.1 09/16/2019    CL 104 09/16/2019    CO2 27 09/16/2019    BUN 18 09/16/2019    CREAT 0.7 09/16/2019    GFR >60 09/16/2019    GLU 109 09/16/2019    [place] 9.7 09/16/2019     Lab Results   Component Value Date    TPROT 7.0 09/16/2019    ALB 4.2 09/16/2019    ALK 102 09/16/2019    AST 15 09/16/2019    ALT 13 09/16/2019    TBILI 0.7 09/16/2019     Lab Results   Component Value Date    CHOL 147 07/25/2019    TRIG 45 07/25/2019    HDLCH2 69 07/25/2019    HDL 80 11/26/2018    LDLCALC 69 07/25/2019    LDL 87 11/26/2018    VLDLC2 9 07/25/2019     Lab Results   Component  Value Date    WBCCOUNT 5.7 09/16/2019    HGB 13.0 09/16/2019    HCT 38.3 09/16/2019    MCV 88.0 09/16/2019    MCHC 34.0 09/16/2019     Lab Results   Component Value Date    TSH 0.86 11/26/2018    FREET4 1.20 (H) 09/16/2019    A1C 5.3 09/16/2019         Assessment and Plan:  Problem List Items Addressed This Visit        Cardiovascular    RBBB    Mild aortic valve stenosis - Primary    Mixed hyperlipidemia      Other Visit Diagnoses     Essential hypertension  1. Aortic stenosis, mild-moderate  Reviewed echo from 03/2018 as well as 06/2019. Discussed findings with patient in-office. Otherwise asymptomatic, no exertional symptoms but is sedentary.  - Plan for repeat echo approximately 1 year since original, around 03/2020, however if patient has it done in 12/2019, we can follow-up with her in clinic afterwards to discuss any progression of her valvular disease    2. Bifascicular block, RBBB with LAFB  - Continue to monitor    3. Hypertension  BP 135/72 today, at goal.   - Switch Lisinopril to Losartan 34m daily due to dry cough    4. Hyperlipidemia  - Continue Atorvastatin 452mdaily    Discussed with Attending: Dr. GrLinward HeadlandReturn to Clinic: 3 months

## 2019-10-09 ENCOUNTER — Encounter: Payer: Self-pay | Admitting: Student in an Organized Health Care Education/Training Program

## 2019-10-09 ENCOUNTER — Telehealth: Payer: Self-pay | Admitting: Student in an Organized Health Care Education/Training Program

## 2019-10-09 MED ORDER — LEVOTHYROXINE SODIUM 125 MCG OR TABS
125.0000 ug | ORAL_TABLET | Freq: Every day | ORAL | 3 refills | Status: DC
Start: 2019-10-09 — End: 2020-01-29

## 2019-10-09 NOTE — Progress Notes (Signed)
Repeat TFTs off biotin with continued e/o over supplementation.     Will decrease dose from 137 to 125 mcg daily and repeat labs in 4 weeks. Message sent to patient via my  health.

## 2019-10-09 NOTE — Telephone Encounter (Signed)
Pt called requesting to speak with Dr Cannon Kettle informed that she is expecting her call.  Contacted clinic was informed by Ladona Ridgel that provider is not yet in clinic and will inform her once she is there.  Please contact pt back.  Thank you.

## 2019-10-09 NOTE — Result Encounter Note (Signed)
Elevated FT4/suppressed TSH even off biotin. Will decrease LT4 from 137 --> 125 and repeat labs in 1 month

## 2019-10-10 NOTE — Telephone Encounter (Signed)
MD spoke with pt

## 2019-10-14 ENCOUNTER — Telehealth: Payer: Self-pay | Admitting: Internal Medicine

## 2019-10-14 ENCOUNTER — Telehealth: Payer: Self-pay | Admitting: Student in an Organized Health Care Education/Training Program

## 2019-10-14 ENCOUNTER — Ambulatory Visit: Payer: Medicare (Managed Care) | Admitting: Psychologist

## 2019-10-14 NOTE — Telephone Encounter (Signed)
Patient is requesting a METFORMIN because sh states that her psychiatric medication made her gain weight. The name of the pharmacy is [id number] - [place], [place] - [address] AT [facility] [address]. Please assist. Thank you.

## 2019-10-14 NOTE — Telephone Encounter (Signed)
Pt requests new presc rx Vitamin B12. Please contact [facility] pharm on file for this rx and call pt back to confirm. Thank you

## 2019-10-16 ENCOUNTER — Encounter: Payer: Self-pay | Admitting: Student in an Organized Health Care Education/Training Program

## 2019-10-16 ENCOUNTER — Ambulatory Visit: Payer: Self-pay | Admitting: Student in an Organized Health Care Education/Training Program

## 2019-10-16 ENCOUNTER — Telehealth: Payer: Self-pay | Admitting: Student in an Organized Health Care Education/Training Program

## 2019-10-16 ENCOUNTER — Telehealth: Payer: Self-pay | Admitting: Ob/Gyn

## 2019-10-16 NOTE — Telephone Encounter (Signed)
Patient called requesting sooner appointment than originally scheduled for. Please assist.

## 2019-10-16 NOTE — Telephone Encounter (Signed)
PLS direct to right location ...

## 2019-10-16 NOTE — Telephone Encounter (Signed)
Patient called requesting to speak with her provider regarding a new medication.

## 2019-10-16 NOTE — Telephone Encounter (Signed)
Patient's husband following up. Requesting low dose of B12

## 2019-10-16 NOTE — Progress Notes (Signed)
Outpatient Psychiatry Progress Note    Interval History:  Julie Monroe a3 year old Female with a past psychiatric history of schizoaffective d/o, bipolar type, OCD and unspecified insomnia who presents for follow-up.     Pt reports mood is stable and denies suicidal ideation, intent and plan. Pt denies associated depressive symptoms incl amotivation, anhedonia, decreased interest, decreased concentration and hopelessness. She reports overall stability in mood and anxiety.     Sleep: good quality with current regimen, at least 6 hours (at times 1 awakening)    Shehe has noticed no change in psychotic symptoms/AH Denies command AH. At some point she does mention that she has a stalker, able to redirect, but seems to be chronic paranoid delusion. Reports tremor is stable and not bothersome.    Social Hx:  Is retired and keeps busy by doing things around the house. Is not exercising very much, but plans to increase frequency of walks to achieve better weight control. Two daughters: ages 27 & 72.       Current Outpatient Medications:     ARIPiprazole (ABILIFY) 15 MG tablet, TAKE 1 TABLET(15 MG) BY MOUTH IN THE MORNING AND AT BEDTIME, Disp: 60 tablet, Rfl: 3    atorvastatin (LIPITOR) 40 MG tablet, Take 1 tablet (40 mg) by mouth daily., Disp: 90 tablet, Rfl: 3    buPROPion (WELLBUTRIN XL) 150 MG XL tablet, Take 1 tablet (150 mg) by mouth every morning., Disp: 90 tablet, Rfl: 3    Cholecalciferol (VITAMIN D3) 50 MCG (2000 UT) capsule, Take 1 capsule by mouth daily., Disp: , Rfl:     Clobetasol Propionate (IMPOYZ) 0.025 % CREA, Apply topically., Disp: , Rfl:     desvenlafaxine (PRISTIQ) 50 MG TB24, Take 1 tablet (50 mg) by mouth daily., Disp: 90 tablet, Rfl: 3    fluPHENAZine (PROLIXIN) 10 MG tablet, Take 1 tablet (10 mg) by mouth daily., Disp: 90 tablet, Rfl: 0    fluPHENAZine (PROLIXIN) 5 MG tablet, , Disp: , Rfl:     gabapentin (NEURONTIN) 600 MG tablet, Take 2 tablets (1,200 mg) by mouth at  bedtime., Disp: 180 tablet, Rfl: 3    levothyroxine (SYNTHROID) 125 MCG tablet, Take 1 tablet (125 mcg) by mouth every morning (before breakfast)., Disp: 90 tablet, Rfl: 3    losartan (COZAAR) 25 MG tablet, Take 1 tablet (25 mg) by mouth daily., Disp: 90 tablet, Rfl: 3    mirabegron (MYRBETRIQ) 50 MG ER tablet, Take 50 mg by mouth daily., Disp: 90 tablet, Rfl: 3    nystatin (MYCOSTATIN) 100000 UNIT/GM powder, Apply 1 Application topically 3 times daily. Apply to affected area, Disp: 60 g, Rfl: 3    orlistat (XENICAL) 120 MG capsule, Take 1 capsule (120 mg) by mouth 3 times daily (with meals)., Disp: 90 capsule, Rfl: 0    trihexyphenidyl (ARTANE) 2 MG tablet, Take 1.5 tablets (3 mg) by mouth nightly., Disp: 135 tablet, Rfl: 3    ziprasidone (GEODON) 80 MG capsule, Take 1 capsule (80 mg) by mouth 2 times daily (with meals)., Disp: 180 capsule, Rfl: 3    Vital Signs:  LMP  (LMP Unknown)     Mental Status Exam:    Speech: Normal rate, tone and volume  Mood: "good"  Thought Process: Linear, goal-directed   Thought Content: Denies suicidal or homicidal ideation, intent and plan. Denies visual hallucinations, and does not appear distractible and internally preoccupied. +ve intermittent AH  Attention Span and Concentration: Not distracted in conversation  Language: Able to name  objects  Fund of Knowledge: Average  Memory: Both recent and remote memories are intact in context of conversation  Orientation: Intact to person, place and situation  Insight/Judgment:Improved    LABS/ OTHER STUDIES    CHEM 7  Lab Results   Component Value Date/Time    NA 141 11/26/2018 05:12 AM    K 4.1 09/16/2019 10:07 AM    K 4.5 11/26/2018 05:12 AM    CL 104 09/16/2019 10:07 AM    CL 105 11/26/2018 05:12 AM    CO2 27 09/16/2019 10:07 AM    BUN 18 09/16/2019 10:07 AM    BUN 18 11/26/2018 05:12 AM    GLU 109 09/16/2019 10:07 AM    GLU 90 11/26/2018 05:12 AM    [place] 9.7 09/16/2019 10:07 AM    [place] 9.7 11/26/2018 05:12 AM     LFT's  Lab Results    Component Value Date/Time    AST 15 09/16/2019 10:07 AM    AST 15 11/26/2018 05:12 AM    ALT 13 09/16/2019 10:07 AM    ALT 9 11/26/2018 05:12 AM    ALB 4.2 09/16/2019 10:07 AM    ALB 4.2 11/26/2018 05:12 AM    TP 6.9 11/26/2018 05:12 AM     CBC  Lab Results   Component Value Date/Time    WBC 4.6 07/09/2018 07:41 AM    HGB 13.0 09/16/2019 10:07 AM    HGB 13.5 07/09/2018 07:41 AM    HCT 38.3 09/16/2019 10:07 AM    HCT 40.4 07/09/2018 07:41 AM    PLT 176 09/16/2019 10:07 AM    PLT 182 07/09/2018 07:41 AM     LIPID PANEL  Lab Results   Component Value Date/Time    CHOL 147 07/25/2019 07:14 AM    CHOL 182 11/26/2018 05:12 AM    HDL 80 11/26/2018 05:12 AM    TRIG 45 07/25/2019 07:14 AM    TRIG 66 11/26/2018 05:12 AM     HGBA1c  No components found for: A1C;3  Miscellaneous   TSH   Date Value Ref Range Status   11/26/2018 0.86 0.40 - 4.50 mIU/L Final        Scales:    PHQ9 DEPRESSION QUESTIONNAIRE 10/10/2018 03/04/2019 07/29/2019   Interest 0 0 0   Depressed 1 0 0   Sleep -- -- --   Energy -- -- --   Appetite -- -- --   Failure -- -- --   Concentration -- -- --   Movement -- -- --   Suicide -- -- --   Summary(Manual) -- -- --   Summary(Calculated) -- -- --   Functional -- -- --   Some recent data might be hidden     No flowsheet data found.    AIMS (02/07/18):  Facial & Oral Movements: 6  Extremity movements: 3  Trunk Movement: 0  Global Judgment: 1  Total: 10    AIMS (02/20/17):  Facial & Oral Movements: 0  Extremity movements: 2  Trunk Movement: 0  Global Judgment: 1  Total: 3    Assessment:   Julie Monroe a7 year old F with a past psychiatric history of schizoaffective d/o, bipolar type, OCD, and unspecified insomnia who presents for follow-up.   She is assessed to be at alow acute risk of suicide.   Clinical Global Impression - Severity:4= Moderately ill  :Suggested Guidelines= Overt symptomatology causing noticeable, but modest, functional impairment. .    Diagnoses:   No diagnosis found.    #  Schizoaffective Disorder, bipolar type - Chronic illness with exacerbation, progression, or treatment side effects - Stable mood and psychotic symptoms on current regimen. Although patient on multiple antipsychotics, this has been long term regimen and she has shown decompensation in past with decreasing dosages. She understands risks of multiple antipsychotics (including but not limited to sedation, metabolic side effects/weight gain/glucose control, EPS). Given that she has started to notice further side effects, she is open to simplifying regimen further.   - Continue fluphenazine 10 mg PO qDaily  - Continue ziprasidone 160 mg PO nightly w/ food    - takes at bedtime without food-- we discussed importance of taking with 500 cal, but will hold off on doing so at this time given changes below.   - Continue bupropion XL 150 mg PO qDaily for mood     - DECREASE aripiprazole from 15 mgPOBID (total: 30 mg daily)  to 25 mg daily, as below:    - Alternating days of: daily dose of 20 mg & 30 mg per day, for average of 25 mg   - Day 1: Take 10 mg (1 tab) at 11 AM, and 20 mg (2 tabs) at 4 pM   - Day 2: Take 10 mg (1 tab) at 11 AM, and 10 mg (1 tab) at 4 pM    # Anxiety- Stable chronic illness - Stable anxiety on regimen.   - Continue gabapentin 1200 mgPO QHS foranxiety and insomnia  - Continue desvenlafaxine 50 mgqDaily    # OCD - Stable chronic illness - Improved on current regimen  - desvenlafaxine as above     # EPS - Stable chronic illness - controlled on current regimen  - Continuetrihexyphenidyl(Artane)3 mg POQHSfor EPS    # Sialorrhea - Chronic illness with exacerbation, progression, or treatment side effects - She has noticed excess salivation throughout the day.   - Start sublingual atropine, 1 drop every morning       Patient was seen, examined, and case discussed along with attending physician.The patient and care were discussed with the attending physician Dr. Hazle Nordmann, and we have jointly formulated  assessment and recommendations as documented in this note.    Renette Butters, M.D.   Resident Physician

## 2019-10-16 NOTE — Progress Notes (Deleted)
Due to COVID-19 pandemic and a federally declared state of public health emergency, this telemedicine visit was conducted Audio Only. Total time spent was 21+ minutes.     Outpatient Psychiatry Progress Note    Chief Complaint:   Patient requested phone appointment due to difficult with technology for video    Interval History:  Kaneesha Constantino a71 year old F with a past psychiatric history of schizoaffective d/o, bipolar type, OCD and unspecified insomnia who presents for follow-up.     ***Pt reports mood is stable and denies suicidal ideation, intent and plan. Pt denies associated depressive symptoms incl amotivation, anhedonia, decreased interest, decreased concentration and hopelessness. She reports overall stability in mood and anxiety.     Since increasing back to prior level of Prolixin, she has noticed *** in psychotic symptoms/AH. ***Denies PI, VH. Denies command AH. Reports tremor is stable and not bothersome.    Social Hx:  Is retired and keeps busy by doing things around the house. Is not exercising very much, but plans to increase frequency of walks to achieve better weight control.      Current Outpatient Medications:     ARIPiprazole (ABILIFY) 15 MG tablet, TAKE 1 TABLET(15 MG) BY MOUTH IN THE MORNING AND AT BEDTIME, Disp: 60 tablet, Rfl: 3    atorvastatin (LIPITOR) 40 MG tablet, Take 1 tablet (40 mg) by mouth daily., Disp: 90 tablet, Rfl: 3    buPROPion (WELLBUTRIN XL) 150 MG XL tablet, Take 1 tablet (150 mg) by mouth every morning., Disp: 90 tablet, Rfl: 3    Cholecalciferol (VITAMIN D3) 50 MCG (2000 UT) capsule, Take 1 capsule by mouth daily., Disp: , Rfl:     Clobetasol Propionate (IMPOYZ) 0.025 % CREA, Apply topically., Disp: , Rfl:     desvenlafaxine (PRISTIQ) 50 MG TB24, Take 1 tablet (50 mg) by mouth daily., Disp: 90 tablet, Rfl: 3    fluPHENAZine (PROLIXIN) 10 MG tablet, Take 1 tablet (10 mg) by mouth daily., Disp: 90 tablet, Rfl: 0    fluPHENAZine (PROLIXIN) 5 MG tablet, , Disp:  , Rfl:     gabapentin (NEURONTIN) 600 MG tablet, Take 2 tablets (1,200 mg) by mouth at bedtime., Disp: 180 tablet, Rfl: 3    levothyroxine (SYNTHROID) 125 MCG tablet, Take 1 tablet (125 mcg) by mouth every morning (before breakfast)., Disp: 90 tablet, Rfl: 3    losartan (COZAAR) 25 MG tablet, Take 1 tablet (25 mg) by mouth daily., Disp: 90 tablet, Rfl: 3    mirabegron (MYRBETRIQ) 50 MG ER tablet, Take 50 mg by mouth daily., Disp: 90 tablet, Rfl: 3    nystatin (MYCOSTATIN) 100000 UNIT/GM powder, Apply 1 Application topically 3 times daily. Apply to affected area, Disp: 60 g, Rfl: 3    orlistat (XENICAL) 120 MG capsule, Take 1 capsule (120 mg) by mouth 3 times daily (with meals)., Disp: 90 capsule, Rfl: 0    trihexyphenidyl (ARTANE) 2 MG tablet, Take 1.5 tablets (3 mg) by mouth nightly., Disp: 135 tablet, Rfl: 3    ziprasidone (GEODON) 80 MG capsule, Take 1 capsule (80 mg) by mouth 2 times daily (with meals)., Disp: 180 capsule, Rfl: 3    Vital Signs:  LMP  (LMP Unknown)     Mental Status Exam:    Speech: Normal rate, tone and volume  Mood: "***"  Thought Process: Linear, goal-directed   Thought Content: Denies suicidal or homicidal ideation, intent and plan. Denies visual hallucinations, and does not appear distractible and internally preoccupied. +ve intermittent AH  Attention Span and Concentration: Not distracted in conversation  Language: Able to name objects  Fund of Knowledge: Average  Memory: Both recent and remote memories are intact in context of conversation  Orientation: Intact to person, place and situation  Insight/Judgment:Improved    LABS/ OTHER STUDIES    CHEM 7  Lab Results   Component Value Date/Time    NA 141 11/26/2018 05:12 AM    K 4.1 09/16/2019 10:07 AM    K 4.5 11/26/2018 05:12 AM    CL 104 09/16/2019 10:07 AM    CL 105 11/26/2018 05:12 AM    CO2 27 09/16/2019 10:07 AM    BUN 18 09/16/2019 10:07 AM    BUN 18 11/26/2018 05:12 AM    GLU 109 09/16/2019 10:07 AM    GLU 90 11/26/2018  05:12 AM    [place] 9.7 09/16/2019 10:07 AM    [place] 9.7 11/26/2018 05:12 AM     LFT's  Lab Results   Component Value Date/Time    AST 15 09/16/2019 10:07 AM    AST 15 11/26/2018 05:12 AM    ALT 13 09/16/2019 10:07 AM    ALT 9 11/26/2018 05:12 AM    ALB 4.2 09/16/2019 10:07 AM    ALB 4.2 11/26/2018 05:12 AM    TP 6.9 11/26/2018 05:12 AM     CBC  Lab Results   Component Value Date/Time    WBC 4.6 07/09/2018 07:41 AM    HGB 13.0 09/16/2019 10:07 AM    HGB 13.5 07/09/2018 07:41 AM    HCT 38.3 09/16/2019 10:07 AM    HCT 40.4 07/09/2018 07:41 AM    PLT 176 09/16/2019 10:07 AM    PLT 182 07/09/2018 07:41 AM     LIPID PANEL  Lab Results   Component Value Date/Time    CHOL 147 07/25/2019 07:14 AM    CHOL 182 11/26/2018 05:12 AM    HDL 80 11/26/2018 05:12 AM    TRIG 45 07/25/2019 07:14 AM    TRIG 66 11/26/2018 05:12 AM     HGBA1c  No components found for: A1C;3  Miscellaneous   TSH   Date Value Ref Range Status   11/26/2018 0.86 0.40 - 4.50 mIU/L Final        Scales:   [place] PHQ9 DEPRESSION QUESTIONNAIRE 10/10/2018 03/04/2019 07/29/2019   Interest 0 0 0   Depressed 1 0 0   Sleep -- -- --   Energy -- -- --   Appetite -- -- --   Failure -- -- --   Concentration -- -- --   Movement -- -- --   Suicide -- -- --   Summary(Manual) -- -- --   Summary(Calculated) -- -- --   Functional -- -- --   Some recent data might be hidden     No flowsheet data found.    AIMS (02/07/18):  Facial & Oral Movements: 6  Extremity movements: 3  Trunk Movement: 0  Global Judgment: 1  Total: 10    AIMS (02/20/17):  Facial & Oral Movements: 0  Extremity movements: 2  Trunk Movement: 0  Global Judgment: 1  Total: 3    Assessment:   Daylyn Winter a34 year old F with a past psychiatric history of schizoaffective d/o, bipolar type, OCD, and unspecified insomnia who presents for follow-up.   She is assessed to be at alow acute risk of suicide.   Clinical Global Impression - Severity:4= Moderately ill  :Suggested Guidelines= Overt symptomatology causing  noticeable, but  modest, functional impairment. .    Diagnoses:   No diagnosis found.    # Schizoaffective Disorder, bipolar type - {Problem Complexity:28605} - ***Stable mood and psychotic symptoms on current regimen. Although patient on multiple antipsychotics, this has been long term regimen and she has shown decompensation in past with decreasing dosages. She understands risks of multiple antipsychotics (including but not limited to sedation, metabolic side effects/weight gain/glucose control, EPS).   - Continue fluphenazine 10 mg PO qDaily  - Continue aripiprazole 15 mgPOBID  - Continue ziprasidone 80 mg PO BID w/ food  - Continue bupropion XL 150 mg PO qDaily for mood     # Anxiety- {Problem Complexity:28605} - Stable anxiety on regimen.   - Continue gabapentin 1200 mgPO QHS foranxiety and insomnia  - Continue desvenlafaxine 50 mgqDaily    # OCD - Stable chronic illness - Improved on current regimen  - desvenlafaxine as above     # EPS - {Problem Complexity:28605} - controlled on current regimen  - Continuetrihexyphenidyl(Artane)3 mg POQHSfor EPS    Patient was seen, examined, and case discussed along with attending physician.The patient and care were discussed with the attending physician Dr. Darryl Lent, and we have jointly formulated assessment and recommendations as documented in this note.    Renette Butters, M.D.   Resident Physician

## 2019-10-17 ENCOUNTER — Ambulatory Visit: Payer: Medicare (Managed Care) | Admitting: Orthopaedic Surgery

## 2019-10-17 MED ORDER — CYANOCOBALAMIN 1000 MCG OR TBCR
500.0000 ug | EXTENDED_RELEASE_TABLET | Freq: Every day | ORAL | 3 refills | Status: AC
Start: 2019-10-17 — End: ?

## 2019-10-17 NOTE — Telephone Encounter (Signed)
No problem, I just sent an rx in to her pharmacy for per day.    Please let her know.    Thank you.

## 2019-10-17 NOTE — Telephone Encounter (Signed)
Spoke with patient who states that she is just wanting a low dose vitamin B12 supplement sent in to her pharmacy.  Understands that it is OTC, but if it is sent in as a prescription, she gets it free. Last B12 level was from outside lab in 12/05/17 and was 307.7. Pharmacy confirmed. Please advise.

## 2019-10-17 NOTE — Telephone Encounter (Signed)
MyChart message sent notifying patient.

## 2019-10-17 NOTE — Telephone Encounter (Signed)
Pt had an appt on 9/8 and there are no sooner appts currently scheduled for 10/4

## 2019-10-18 ENCOUNTER — Encounter: Payer: Self-pay | Admitting: Student in an Organized Health Care Education/Training Program

## 2019-10-22 ENCOUNTER — Ambulatory Visit: Payer: Self-pay | Admitting: Ob/Gyn

## 2019-10-23 ENCOUNTER — Ambulatory Visit: Payer: Medicare (Managed Care) | Admitting: Orthopaedic Surgery

## 2019-11-03 ENCOUNTER — Ambulatory Visit (INDEPENDENT_AMBULATORY_CARE_PROVIDER_SITE_OTHER): Payer: Medicare (Managed Care) | Admitting: Student in an Organized Health Care Education/Training Program

## 2019-11-03 DIAGNOSIS — F250 Schizoaffective disorder, bipolar type: Secondary | ICD-10-CM

## 2019-11-03 DIAGNOSIS — K117 Disturbances of salivary secretion: Secondary | ICD-10-CM

## 2019-11-03 DIAGNOSIS — R251 Tremor, unspecified: Secondary | ICD-10-CM

## 2019-11-03 DIAGNOSIS — F419 Anxiety disorder, unspecified: Secondary | ICD-10-CM

## 2019-11-03 MED ORDER — FLUPHENAZINE HCL 10 MG OR TABS
10.0000 mg | ORAL_TABLET | Freq: Every day | ORAL | 1 refills | Status: DC
Start: 2019-11-03 — End: 2020-02-03

## 2019-11-03 MED ORDER — ARIPIPRAZOLE 10 MG OR TABS
ORAL_TABLET | ORAL | 1 refills | Status: DC
Start: 2019-11-03 — End: 2019-11-13

## 2019-11-03 MED ORDER — ATROPINE SULFATE 1 % OP SOLN
1.0000 [drp] | Freq: Every day | OPHTHALMIC | 0 refills | Status: DC
Start: 2019-11-03 — End: 2020-02-03

## 2019-11-03 NOTE — Patient Instructions (Signed)
Change aripiprazole (Abilify) to:     Alternating days of:    - Day 1: Take 10 mg (1 tab) at 11 AM, and 20 mg (2 tabs) at 4 pM   - Day 2: Take 10 mg (1 tab) at 11 AM, and 10 mg (1 tab) at 4 pM   - Day 3: take same as day 1, alternating total dose of 20 mg & 30 mg per day.

## 2019-11-06 ENCOUNTER — Other Ambulatory Visit: Payer: Self-pay | Admitting: Internal Medicine

## 2019-11-06 ENCOUNTER — Telehealth: Payer: Self-pay | Admitting: Urology

## 2019-11-06 DIAGNOSIS — Z1231 Encounter for screening mammogram for malignant neoplasm of breast: Secondary | ICD-10-CM

## 2019-11-06 NOTE — Telephone Encounter (Signed)
Pt called in requesting callback in regards to wanting a tele-med appt. Please call pt back and further assist.

## 2019-11-07 NOTE — Progress Notes (Signed)
Attending Note:     Subjective/Objective:   I reviewed the history. Patient examined with the resident.     Review of Systems (ROS): As per the resident's note.  Past Medical, Family, Social History:  As per the resident's  note.    I concur with the resident's exam.     Assessment and Plan:  Reviewed with the resident physician and discussed with the patient.  I agree with the resident's plan as documented.     Please see the resident's note for further details.    Marianita Botkin, M.D.  Attending Physician  Professor of Clinical Psychiatry

## 2019-11-11 ENCOUNTER — Encounter: Payer: Self-pay | Admitting: Urology

## 2019-11-11 NOTE — Telephone Encounter (Signed)
Error

## 2019-11-11 NOTE — Telephone Encounter (Signed)
Scheduled tel med no further action required at this time.

## 2019-11-11 NOTE — Telephone Encounter (Signed)
Patient is requesting to speak to nurse to coordinate a telemedicine visit to discuss medication options.   She states current medication she is taking is not working.     Please contact patient for further assistance. Thank you.

## 2019-11-12 ENCOUNTER — Ambulatory Visit (INDEPENDENT_AMBULATORY_CARE_PROVIDER_SITE_OTHER): Payer: Medicare (Managed Care)

## 2019-11-12 ENCOUNTER — Encounter: Payer: Self-pay | Admitting: Student in an Organized Health Care Education/Training Program

## 2019-11-12 DIAGNOSIS — Z23 Encounter for immunization: Secondary | ICD-10-CM

## 2019-11-12 DIAGNOSIS — F250 Schizoaffective disorder, bipolar type: Secondary | ICD-10-CM

## 2019-11-13 ENCOUNTER — Encounter: Payer: Self-pay | Admitting: Student in an Organized Health Care Education/Training Program

## 2019-11-13 ENCOUNTER — Telehealth: Payer: Self-pay | Admitting: Urology

## 2019-11-13 DIAGNOSIS — N3941 Urge incontinence: Secondary | ICD-10-CM

## 2019-11-13 DIAGNOSIS — F250 Schizoaffective disorder, bipolar type: Secondary | ICD-10-CM

## 2019-11-13 MED ORDER — ARIPIPRAZOLE 15 MG OR TABS
15.0000 mg | ORAL_TABLET | Freq: Two times a day (BID) | ORAL | 2 refills | Status: DC
Start: 2019-11-13 — End: 2020-02-03

## 2019-11-17 ENCOUNTER — Ambulatory Visit (INDEPENDENT_AMBULATORY_CARE_PROVIDER_SITE_OTHER): Payer: Medicare (Managed Care) | Admitting: Ob/Gyn

## 2019-11-17 ENCOUNTER — Telehealth: Payer: Self-pay | Admitting: Urology

## 2019-11-17 ENCOUNTER — Encounter: Payer: Self-pay | Admitting: Urology

## 2019-11-17 ENCOUNTER — Encounter: Payer: Self-pay | Admitting: Ob/Gyn

## 2019-11-17 ENCOUNTER — Telehealth: Payer: Medicare (Managed Care) | Admitting: Urology

## 2019-11-17 VITALS — BP 129/55 | HR 66 | Temp 97.1°F | Ht 66.0 in | Wt 266.8 lb

## 2019-11-17 DIAGNOSIS — R350 Frequency of micturition: Secondary | ICD-10-CM

## 2019-11-17 DIAGNOSIS — N3941 Urge incontinence: Secondary | ICD-10-CM

## 2019-11-17 DIAGNOSIS — N907 Vulvar cyst: Secondary | ICD-10-CM

## 2019-11-17 NOTE — Progress Notes (Signed)
Due to COVID-19 pandemic and a federally declared state of public health emergency, this telemedicine visit was conducted Audio Only. Total time spent was 11-20 minutes.     Patient confirmed location in [place] and consented to telemedicine visit      Urology Follow Up Note    HISTORY OF PRESENT ILLNESS:  Julie Monroe is a 74 year old-year-old woman here for follow up. She has a history ofurgency urinary incontinence. Cystoscopy on12/16/19demonstrated no lesions. She has been treated withoxybutyninand with PTNS x 4 sessions.She was doing well for some time after the PTNS, but she stopped the treatments because she did not feel it was helping. Then her symptoms started to worsen again with urgency and urgency incontinence.She did not wish to proceed with third line therapy so we started dual medical therapy with myrbetriq and oxybutynin.She then stopped the myrbetriq because she was doing well without it, but then changed and started taking myrbetriq on its own.    Today she reports she is voiding "all day and all night long." She continues on myrbetriq. She has to wear a pad when she takes a 2 hour drive to [place]. She is drinking 4 cans of diet coke per day, one cup of coffee.         Current Outpatient Medications:     ARIPiprazole (ABILIFY) 15 MG tablet, Take 1 tablet (15 mg) by mouth in the morning and at bedtime., Disp: 60 tablet, Rfl: 2    atorvastatin (LIPITOR) 40 MG tablet, Take 1 tablet (40 mg) by mouth daily., Disp: 90 tablet, Rfl: 3    atropine (ISOPTO) 1 % ophthalmic solution, 1 drop by Sublingual route daily. 1 Drop under the tongue daily for excess salivation. May dispense 1 bottle of 5 mL (if not available can give other size)., Disp: 1 each, Rfl: 0    buPROPion (WELLBUTRIN XL) 150 MG XL tablet, Take 1 tablet (150 mg) by mouth every morning., Disp: 90 tablet, Rfl: 3    Cholecalciferol (VITAMIN D3) 50 MCG (2000 UT) capsule, Take 1 capsule by mouth daily., Disp: , Rfl:     Clobetasol Propionate  (IMPOYZ) 0.025 % CREA, Apply topically., Disp: , Rfl:     cyanocobalamin ER 1000 MCG tablet, Take 0.5 tablets (500 mcg) by mouth daily., Disp: 90 tablet, Rfl: 3    desvenlafaxine (PRISTIQ) 50 MG TB24, Take 1 tablet (50 mg) by mouth daily., Disp: 90 tablet, Rfl: 3    fluPHENAZine (PROLIXIN) 10 MG tablet, Take 1 tablet (10 mg) by mouth daily., Disp: 90 tablet, Rfl: 1    gabapentin (NEURONTIN) 600 MG tablet, Take 2 tablets (1,200 mg) by mouth at bedtime., Disp: 180 tablet, Rfl: 3    levothyroxine (SYNTHROID) 125 MCG tablet, Take 1 tablet (125 mcg) by mouth every morning (before breakfast)., Disp: 90 tablet, Rfl: 3    losartan (COZAAR) 25 MG tablet, Take 1 tablet (25 mg) by mouth daily., Disp: 90 tablet, Rfl: 3    mirabegron (MYRBETRIQ) 50 MG ER tablet, Take 50 mg by mouth daily., Disp: 90 tablet, Rfl: 3    nystatin (MYCOSTATIN) 100000 UNIT/GM powder, Apply 1 Application topically 3 times daily. Apply to affected area, Disp: 60 g, Rfl: 3    orlistat (XENICAL) 120 MG capsule, Take 1 capsule (120 mg) by mouth 3 times daily (with meals)., Disp: 90 capsule, Rfl: 0    trihexyphenidyl (ARTANE) 2 MG tablet, Take 1.5 tablets (3 mg) by mouth nightly., Disp: 135 tablet, Rfl: 3    ziprasidone (GEODON)  80 MG capsule, Take 1 capsule (80 mg) by mouth 2 times daily (with meals)., Disp: 180 capsule, Rfl: 3    Past Medical History:   Diagnosis Date    Bipolar affect, depressed (CMS-HCC)     Chronic back pain     Hypothyroidism     Insomnia     OAB (overactive bladder)     OCD (obsessive compulsive disorder)     Osteopenia     Pure hypercholesterolemia 10/10/2018    Schizophrenia (CMS-HCC)        Allergies   Allergen Reactions    Lisinopril Cough       ASSESSMENT:  Ms. Outten is a 74 year old woman with urinary frequency and urgency urinary incontinence from overactive bladder.      PLAN:  - Cont myrbetriq  - Discussed stopping intake of soda  - If no improvement after fluid modifications can consider adding  additional medication or third line therapy  - Follow up 2 months

## 2019-11-17 NOTE — Progress Notes (Signed)
[id number]   Julie Monroe   [facility]  11/17/2019   Gender: female  Current Location: [facility]  DOB: [birth year]      OB/GYN      Amb Follow Up Note, Gynecology   Clinician Documentation:    Alberteen Sam, M.D,  [facility]   OB/GYN    Chief Complaint: Labial Cyst    History of Present Illness:  This is a 74 year old female, [id number]  LMP: No LMP recorded (lmp unknown). Patient is postmenopausal.   Presenting in follow up for labial epidermal cyst.  It is not bothersome but patient desires removal. Feels bigger      Medications:  Current Outpatient Medications:   .  ARIPiprazole (ABILIFY) 15 MG tablet, Take 1 tablet (15 mg) by mouth in the morning and at bedtime., Disp: 60 tablet, Rfl: 2  .  atorvastatin (LIPITOR) 40 MG tablet, Take 1 tablet (40 mg) by mouth daily., Disp: 90 tablet, Rfl: 3  .  atropine (ISOPTO) 1 % ophthalmic solution, 1 drop by Sublingual route daily. 1 Drop under the tongue daily for excess salivation. May dispense 1 bottle of 5 mL (if not available can give other size)., Disp: 1 each, Rfl: 0  .  buPROPion (WELLBUTRIN XL) 150 MG XL tablet, Take 1 tablet (150 mg) by mouth every morning., Disp: 90 tablet, Rfl: 3  .  Cholecalciferol (VITAMIN D3) 50 MCG (2000 UT) capsule, Take 1 capsule by mouth daily., Disp: , Rfl:   .  Clobetasol Propionate (IMPOYZ) 0.025 % CREA, Apply topically., Disp: , Rfl:   .  cyanocobalamin ER 1000 MCG tablet, Take 0.5 tablets (500 mcg) by mouth daily., Disp: 90 tablet, Rfl: 3  .  desvenlafaxine (PRISTIQ) 50 MG TB24, Take 1 tablet (50 mg) by mouth daily., Disp: 90 tablet, Rfl: 3  .  fluPHENAZine (PROLIXIN) 10 MG tablet, Take 1 tablet (10 mg) by mouth daily., Disp: 90 tablet, Rfl: 1  .  gabapentin (NEURONTIN) 600 MG tablet, Take 2 tablets (1,200 mg) by mouth at bedtime., Disp: 180 tablet, Rfl: 3  .  levothyroxine (SYNTHROID) 125 MCG tablet, Take 1 tablet (125 mcg) by mouth every morning (before breakfast)., Disp: 90 tablet, Rfl: 3  .  losartan (COZAAR) 25 MG  tablet, Take 1 tablet (25 mg) by mouth daily., Disp: 90 tablet, Rfl: 3  .  mirabegron (MYRBETRIQ) 50 MG ER tablet, Take 50 mg by mouth daily., Disp: 90 tablet, Rfl: 3  .  nystatin (MYCOSTATIN) 100000 UNIT/GM powder, Apply 1 Application topically 3 times daily. Apply to affected area, Disp: 60 g, Rfl: 3  .  orlistat (XENICAL) 120 MG capsule, Take 1 capsule (120 mg) by mouth 3 times daily (with meals)., Disp: 90 capsule, Rfl: 0  .  trihexyphenidyl (ARTANE) 2 MG tablet, Take 1.5 tablets (3 mg) by mouth nightly., Disp: 135 tablet, Rfl: 3  .  ziprasidone (GEODON) 80 MG capsule, Take 1 capsule (80 mg) by mouth 2 times daily (with meals)., Disp: 180 capsule, Rfl: 3    Allergies:  Allergies   Allergen Reactions   . Lisinopril Cough       Review of Systems:  . Constitutional:  Denies fever, chills, weight loss, weight gain  . Head & Neck:  Denies headache, vision change, hearing change, sore throat  . Respiratory:  Denies shortness of breath, cough  . Cardiovascular:  Denies chest pain/pressure, orthopnea, PND, palpitations  . Gastrointestinal:  Denies abdominal pain, nausea, vomiting, diarrhea, or rectal bleeding  .  Genitourinary:  Denies dysuria, polyuria, hesitancy, frequency  . Musculoskeletal:  Denies joint pain, leg edema  . Neurological:  Denies  focal numbness, weakness  . All other Review of Systems is negative   .   Physical Exam:   Vital Signs:  BP 129/55 (BP Location: Left arm, BP Patient Position: Sitting, BP cuff size: Regular)   Pulse 66   Temp 97.1 F (36.2 C) (Temporal)   Ht 5\' 6"  (1.676 m)   Wt 121 kg (266 lb 12.1 oz)   LMP  (LMP Unknown)   BMI 43.06 kg/m     General:  female, in nad   GU/Pelvic:    BUS: no lesions, masses or tenderness, noted right 2 cm well circumscribed epidermal cyst on mid labia.   CX/VAG: no discharge, lesions or odor   Biman: normal uterus size and contour, no cmt.  No adnexal masses or tenderness    Extremities:   no edema   Neurological:  alert and oriented x 3, motor  5/5, sensory intact   Skin:  no suspicious lesions     Diagnostic Data:    Assessment/Plan  Julie Monroe is a 74 year old presenting today for follow up regarding labial cyst:     ICD-10-CM ICD-9-CM   1. Labial cyst  N90.7 624.8   bothersome, increased in size, due to appearance and nature, not a concern for malignancy or infection  Discussed options: conservative vs removal   Risks/benefits/details  Patient desires removal of her labial epidermal cyst.    - Follow up for office procedure,. epidermal cyst removal and  I&D       62, M.D.   Ob/Gyn Attending      Scribe Attestation  The notes I am recording reflect only actions made by and judgments taken by this provider, Dr. Alberteen Sam, for whom I am scribing today.  I have performed no independent clinical work.    Learta Codding    ____________________________________________________________________    Provider Attestation for Scribed Note    As the attending provider, I agree with the scribed content.  Any changes or edits are noted in the text above.    Roanna Raider

## 2019-11-17 NOTE — Telephone Encounter (Signed)
Call received and transferred to Dr. Moskowitz.

## 2019-11-17 NOTE — Telephone Encounter (Signed)
Patient states she was speaking to MD and line was disconnected.     Transferred call to [facility].

## 2019-11-18 ENCOUNTER — Ambulatory Visit: Payer: Medicare (Managed Care) | Admitting: Internal Medicine

## 2019-11-18 NOTE — Telephone Encounter (Signed)
Pt was able to do tel med with Dr Cecile Hearing

## 2019-11-19 ENCOUNTER — Encounter: Payer: Self-pay | Admitting: Student in an Organized Health Care Education/Training Program

## 2019-11-19 MED ORDER — TRIHEXYPHENIDYL HCL 2 MG OR TABS
4.0000 mg | ORAL_TABLET | Freq: Every evening | ORAL | 2 refills | Status: AC
Start: 2019-11-19 — End: ?

## 2019-11-27 ENCOUNTER — Ambulatory Visit: Payer: Medicare (Managed Care) | Attending: Orthopaedic Surgery | Admitting: Orthopaedic Surgery

## 2019-11-27 ENCOUNTER — Encounter: Payer: Self-pay | Admitting: Orthopaedic Surgery

## 2019-11-27 VITALS — BP 138/70 | HR 65 | Temp 97.5°F | Resp 18 | Ht 66.0 in | Wt 265.0 lb

## 2019-11-27 DIAGNOSIS — M545 Low back pain, unspecified: Secondary | ICD-10-CM | POA: Insufficient documentation

## 2019-11-27 DIAGNOSIS — Z6841 Body Mass Index (BMI) 40.0 and over, adult: Secondary | ICD-10-CM

## 2019-11-27 DIAGNOSIS — M1612 Unilateral primary osteoarthritis, left hip: Secondary | ICD-10-CM | POA: Insufficient documentation

## 2019-11-27 DIAGNOSIS — G8929 Other chronic pain: Secondary | ICD-10-CM

## 2019-11-27 MED ORDER — MELOXICAM 15 MG OR TABS
15.0000 mg | ORAL_TABLET | Freq: Every day | ORAL | 1 refills | Status: AC
Start: 2019-11-27 — End: ?

## 2019-11-27 NOTE — Progress Notes (Signed)
Sabra Heck, MD      Assistant Clinical Professor      Division of Hip & Knee Arthroplasty      Department of Orthopaedic Surgery                  [facility]   [facility]    [address] [place], [place], [place] [address]     [place], [place] [address]   [place], [place] [address]   Phone: [phone]   Phone: [phone]   Fax: [phone]   Fax: [phone]     [facility]   [address] [place], [address]   [place], [place] [address]   [place], [place] [address]   Phone: [phone]   Phone: [phone]      Fax: [phone]               Office Consultation    Date of Visit: 11/27/2019  Patient name: Julie Monroe  Date of birth: 1945/11/06    Referring Provider: Leonia Reader  Primary Care Physician: Leonia Reader    I had the pleasure of seeing Julie Monroe for an office consultation on 11/27/2019. Please see my note below:     History of Present Illness:  She is a 73 year old female who is being seen for her left hip and knee, as well as her low back.  She has had pain in her left knee for approximately 2 months.  She had an x-ray of her hips 1 month ago, which demonstrated end-stage arthritis of the left hip, and moderate arthritis of the right hip.  She has had longstanding low back pain, which is midline.    She has a history of bilateral total knee replacements.  Her right knee was done in 2011, and her left knee was done in 2013. Her surgeon was Dr. Latanya Maudlin.  She currently walks without any assistive devices.  She denies any significant left hip pain, though she endorses anterior knee pain when she goes from a seated to standing position.  She rates the pain at 2 to 3/10.  She has tried taking Advil for the pain occasionally, which helps.  She does not take anything else.  She has had physical therapy in the past, and states that she hates it.    She also endorses longstanding low back  pain.  Is midline, near her coccyx.  It limits her from standing for prolonged periods of time.    She is here with her husband today.  They live in [place].  Her husband is being seen and treated by Dr. Elpidio Galea for his shoulder.    Past Medical History:   Past Medical History:   Diagnosis Date    Bipolar affect, depressed (CMS-HCC)     Chronic back pain     Hypothyroidism     Insomnia     OAB (overactive bladder)     OCD (obsessive compulsive disorder)     Osteopenia     Pure hypercholesterolemia 10/10/2018    Schizophrenia (CMS-HCC)      Patient Active Problem List   Diagnosis    Schizoaffective disorder, bipolar type (CMS-HCC)    Insomnia    Morbid obesity (CMS-HCC)    Tremor    Osteoarthrosis    Hypothyroidism    Obsessive-compulsive disorder, unspecified type    Anxiety  Extrapyramidal and movement disorder    Pure hypercholesterolemia    Chest pain, unspecified type    RBBB    Mild aortic valve stenosis    Mixed hyperlipidemia    Actinic keratosis    Solar lentigo    Nutritional counseling    Exercise counseling    Cough due to ACE inhibitor       Past Surgical History:  Past Surgical History:   Procedure Laterality Date    Biilat. Knee replacement      Rt toe surgery      TOTAL KNEE ARTHROPLASTY Bilateral        Current Medications:    Current Outpatient Medications:     ARIPiprazole (ABILIFY) 15 MG tablet, Take 1 tablet (15 mg) by mouth in the morning and at bedtime., Disp: 60 tablet, Rfl: 2    atorvastatin (LIPITOR) 40 MG tablet, Take 1 tablet (40 mg) by mouth daily., Disp: 90 tablet, Rfl: 3    atropine (ISOPTO) 1 % ophthalmic solution, 1 drop by Sublingual route daily. 1 Drop under the tongue daily for excess salivation. May dispense 1 bottle of 5 mL (if not available can give other size)., Disp: 1 each, Rfl: 0    buPROPion (WELLBUTRIN XL) 150 MG XL tablet, Take 1 tablet (150 mg) by mouth every morning., Disp: 90 tablet, Rfl: 3    Cholecalciferol (VITAMIN D3) 50 MCG (2000  UT) capsule, Take 1 capsule by mouth daily., Disp: , Rfl:     Clobetasol Propionate (IMPOYZ) 0.025 % CREA, Apply topically., Disp: , Rfl:     cyanocobalamin ER 1000 MCG tablet, Take 0.5 tablets (500 mcg) by mouth daily., Disp: 90 tablet, Rfl: 3    desvenlafaxine (PRISTIQ) 50 MG TB24, Take 1 tablet (50 mg) by mouth daily., Disp: 90 tablet, Rfl: 3    fluPHENAZine (PROLIXIN) 10 MG tablet, Take 1 tablet (10 mg) by mouth daily., Disp: 90 tablet, Rfl: 1    gabapentin (NEURONTIN) 600 MG tablet, Take 2 tablets (1,200 mg) by mouth at bedtime., Disp: 180 tablet, Rfl: 3    levothyroxine (SYNTHROID) 125 MCG tablet, Take 1 tablet (125 mcg) by mouth every morning (before breakfast)., Disp: 90 tablet, Rfl: 3    losartan (COZAAR) 25 MG tablet, Take 1 tablet (25 mg) by mouth daily., Disp: 90 tablet, Rfl: 3    meloxicam (MOBIC) 15 MG tablet, Take 1 tablet (15 mg) by mouth daily., Disp: 90 tablet, Rfl: 1    mirabegron (MYRBETRIQ) 50 MG ER tablet, Take 50 mg by mouth daily., Disp: 90 tablet, Rfl: 3    nystatin (MYCOSTATIN) 100000 UNIT/GM powder, Apply 1 Application topically 3 times daily. Apply to affected area, Disp: 60 g, Rfl: 3    orlistat (XENICAL) 120 MG capsule, Take 1 capsule (120 mg) by mouth 3 times daily (with meals)., Disp: 90 capsule, Rfl: 0    trihexyphenidyl (ARTANE) 2 MG tablet, Take 2 tablets (4 mg) by mouth nightly., Disp: 180 tablet, Rfl: 2    ziprasidone (GEODON) 80 MG capsule, Take 1 capsule (80 mg) by mouth 2 times daily (with meals)., Disp: 180 capsule, Rfl: 3    Allergies:  Lisinopril    Physical Examination  General: Awake, alert, and oriented to person, place, time and situation. No acute distress.  Vital Signs:  BP 138/70 (BP Location: Left arm, BP Patient Position: Sitting)    Pulse 65    Temp 97.5 F (36.4 C) (Oral)    Resp 18    Ht 5\' 6"  (1.676  m)    Wt 120.2 kg (265 lb)    LMP  (LMP Unknown)    BMI 42.77 kg/m   Cardiovascular: Regular rate and rhythm  Pulmonary: Normal respiratory rate,  non-labored breathing  Gait: mild antalgic, no assistive devices    Lower extremity examination: left  Skin: intact about hip. Healed midline incision at the knee  Tenderness to palpation: none    Hip range of motion:   Flexion: to 90 without discomfort  External rotation: 45 with anterior knee pain  Internal rotation: 15 with anterior knee pain    Stinchfield: positive - groin pain  Straight leg raise: negative  Leg length discrepancy: none apparent    Knee range of motion: 0-110 with soft tissue impingement  Alignment: neutral  Stable to varus/valgus stress  Negative anterior and posterior drawer  No joint line tenderness    Neurovascular exam:  EHL/FHL/TA/GS: intact  DP/PT: palpable      Imaging Studies:    I reviewed and interpreted the following images:   X-rays of the bilateral hips and pelvis from September 16, 2019 demonstrate moderate right hip arthritis, with narrowing of joint space near the fovea.  The left hip demonstrates end-stage arthritis with osteophyte formation.    Assessment:  This is a 74 year old female, with radiographic evidence of end-stage arthritis of the left hip.  I discussed with her diagnosis.  I believe that her left knee pain is referred pain from her hip arthritis, which I discussed with her.  Given that her pain is only 2 to 3/10, I recommend continued conservative treatment.  I will prescribe her meloxicam for her back pain and hip pain.  We discussed physical therapy and its will for treating arthritis as well as low back pain.  She is not interested in further therapy at this time.  I discussed with her a referral to pain management for evaluation of her low back pain and possible injection if indicated.  She is interested, and I will submit the referral.    Plan:  - AAOS hip conditioning packet given for hip exercises  - referral to pain management for low back pain  - meloxicam 15mg  PO daily  - follow up as needed    Attestation:  I have personally reviewed and interpreted the  patient's prior and current diagnostic testing, relevant labs and imaging, risk factors, and external notes from their other treating physicians in the process of formulating my assessment and plan of care for their current symptoms and pathology.    I spent approximately 40 minutes of total time on the day of the above encounter preparing for the visit and obtaining relevant medical information, interviewing and examining the patient, formulating and discussing a treatment plan, providing additional aftercare including medications, tests, procedures, and/or coordinating further care with relevant external physicians and healthcare providers as needed.    , M.D.  Department of Orthopaedic Surgery  [facility]    Dictated with MModal. Please excuse any errors in transcription.

## 2019-12-05 ENCOUNTER — Ambulatory Visit: Payer: Self-pay | Admitting: Student in an Organized Health Care Education/Training Program

## 2019-12-15 ENCOUNTER — Ambulatory Visit (INDEPENDENT_AMBULATORY_CARE_PROVIDER_SITE_OTHER): Payer: Medicare (Managed Care) | Admitting: Student in an Organized Health Care Education/Training Program

## 2019-12-15 DIAGNOSIS — G259 Extrapyramidal and movement disorder, unspecified: Secondary | ICD-10-CM

## 2019-12-15 DIAGNOSIS — F419 Anxiety disorder, unspecified: Secondary | ICD-10-CM

## 2019-12-15 DIAGNOSIS — F250 Schizoaffective disorder, bipolar type: Secondary | ICD-10-CM

## 2019-12-15 MED ORDER — BUPROPION SR (BID) 100 MG OR TB12
100.0000 mg | ORAL_TABLET | Freq: Every day | ORAL | 0 refills | Status: DC
Start: 2019-12-15 — End: 2020-02-03

## 2019-12-15 NOTE — Progress Notes (Signed)
Attending Note:     Subjective/Objective:   I reviewed the history. Patient examined with the resident.     Review of Systems (ROS): As per the resident's note.  Past Medical, Family, Social History:  As per the resident's  note.    I concur with the resident's exam.     Assessment and Plan:  Reviewed with the resident physician and discussed with the patient.  I agree with the resident's plan as documented.     Please see the resident's note for further details.    Seneca Gadbois, M.D.  Attending Physician  Professor of Clinical Psychiatry

## 2019-12-15 NOTE — Progress Notes (Signed)
Outpatient Psychiatry Progress Note    Interval History:  Julie Monroe a63 year old Female with a past psychiatric history of schizoaffective d/o, bipolar type, OCD and unspecified insomnia who presents for follow-up.     Pt reports mood is stable and denies suicidal ideation, intent and plan. Pt denies associated depressive symptoms incl amotivation, anhedonia, decreased interest, decreased concentration and hopelessness. She reports overall stability in mood and anxiety.     Sleep: good quality with current regimen, at least 6 hours (at times 1 awakening)    In the interim, we decided not to decrease abilify as discussed at last visit. Chronic paranoid delusion of stalker, redirectable. States that he is contacting her less frequently, as he is shifting his attention over to husband Arlys John.   Denies command AH.  Reports tremor is stable and not bothersome.    Social Hx:  Is retired and keeps busy by doing things around the house. Is not exercising very much, but plans to increase frequency of walks to achieve better weight control. Two daughters: ages 28 & 42.     Substance: Denies ETOH, tobaccos, cannabis, caffeine.       Current Outpatient Medications:     ARIPiprazole (ABILIFY) 15 MG tablet, Take 1 tablet (15 mg) by mouth in the morning and at bedtime., Disp: 60 tablet, Rfl: 2    atorvastatin (LIPITOR) 40 MG tablet, Take 1 tablet (40 mg) by mouth daily., Disp: 90 tablet, Rfl: 3    atropine (ISOPTO) 1 % ophthalmic solution, 1 drop by Sublingual route daily. 1 Drop under the tongue daily for excess salivation. May dispense 1 bottle of 5 mL (if not available can give other size)., Disp: 1 each, Rfl: 0    buPROPion (WELLBUTRIN XL) 150 MG XL tablet, Take 1 tablet (150 mg) by mouth every morning., Disp: 90 tablet, Rfl: 3    Cholecalciferol (VITAMIN D3) 50 MCG (2000 UT) capsule, Take 1 capsule by mouth daily., Disp: , Rfl:     Clobetasol Propionate (IMPOYZ) 0.025 % CREA, Apply topically., Disp: , Rfl:      cyanocobalamin ER 1000 MCG tablet, Take 0.5 tablets (500 mcg) by mouth daily., Disp: 90 tablet, Rfl: 3    desvenlafaxine (PRISTIQ) 50 MG TB24, Take 1 tablet (50 mg) by mouth daily., Disp: 90 tablet, Rfl: 3    fluPHENAZine (PROLIXIN) 10 MG tablet, Take 1 tablet (10 mg) by mouth daily., Disp: 90 tablet, Rfl: 1    gabapentin (NEURONTIN) 600 MG tablet, Take 2 tablets (1,200 mg) by mouth at bedtime., Disp: 180 tablet, Rfl: 3    levothyroxine (SYNTHROID) 125 MCG tablet, Take 1 tablet (125 mcg) by mouth every morning (before breakfast)., Disp: 90 tablet, Rfl: 3    losartan (COZAAR) 25 MG tablet, Take 1 tablet (25 mg) by mouth daily., Disp: 90 tablet, Rfl: 3    meloxicam (MOBIC) 15 MG tablet, Take 1 tablet (15 mg) by mouth daily., Disp: 90 tablet, Rfl: 1    mirabegron (MYRBETRIQ) 50 MG ER tablet, Take 50 mg by mouth daily., Disp: 90 tablet, Rfl: 3    nystatin (MYCOSTATIN) 100000 UNIT/GM powder, Apply 1 Application topically 3 times daily. Apply to affected area, Disp: 60 g, Rfl: 3    orlistat (XENICAL) 120 MG capsule, Take 1 capsule (120 mg) by mouth 3 times daily (with meals)., Disp: 90 capsule, Rfl: 0    trihexyphenidyl (ARTANE) 2 MG tablet, Take 2 tablets (4 mg) by mouth nightly., Disp: 180 tablet, Rfl: 2    ziprasidone (  GEODON) 80 MG capsule, Take 1 capsule (80 mg) by mouth 2 times daily (with meals)., Disp: 180 capsule, Rfl: 3    Vital Signs:  LMP  (LMP Unknown)     Mental Status Exam:    Speech: Normal rate, tone and volume  Mood: "good"  Thought Process: Linear, goal-directed   Thought Content: Denies suicidal or homicidal ideation, intent and plan. Denies visual hallucinations, and does not appear distractible and internally preoccupied. +ve intermittent AH  Attention Span and Concentration: Not distracted in conversation  Language: Able to name objects  Fund of Knowledge: Average  Memory: Both recent and remote memories are intact in context of conversation  Orientation: Intact to person, place and  situation  Insight/Judgment:Improved    LABS/ OTHER STUDIES    CHEM 7  Lab Results   Component Value Date/Time    NA 141 11/26/2018 05:12 AM    K 4.1 09/16/2019 10:07 AM    K 4.5 11/26/2018 05:12 AM    CL 104 09/16/2019 10:07 AM    CL 105 11/26/2018 05:12 AM    CO2 27 09/16/2019 10:07 AM    BUN 18 09/16/2019 10:07 AM    BUN 18 11/26/2018 05:12 AM    GLU 109 09/16/2019 10:07 AM    GLU 90 11/26/2018 05:12 AM    [place] 9.7 09/16/2019 10:07 AM     9.7 11/26/2018 05:12 AM     LFT's  Lab Results   Component Value Date/Time    AST 15 09/16/2019 10:07 AM    AST 15 11/26/2018 05:12 AM    ALT 13 09/16/2019 10:07 AM    ALT 9 11/26/2018 05:12 AM    ALB 4.2 09/16/2019 10:07 AM    ALB 4.2 11/26/2018 05:12 AM    TP 6.9 11/26/2018 05:12 AM     CBC  Lab Results   Component Value Date/Time    WBC 4.6 07/09/2018 07:41 AM    HGB 13.0 09/16/2019 10:07 AM    HGB 13.5 07/09/2018 07:41 AM    HCT 38.3 09/16/2019 10:07 AM    HCT 40.4 07/09/2018 07:41 AM    PLT 176 09/16/2019 10:07 AM    PLT 182 07/09/2018 07:41 AM     LIPID PANEL  Lab Results   Component Value Date/Time    CHOL 147 07/25/2019 07:14 AM    CHOL 182 11/26/2018 05:12 AM    HDL 80 11/26/2018 05:12 AM    TRIG 45 07/25/2019 07:14 AM    TRIG 66 11/26/2018 05:12 AM     HGBA1c  No components found for: A1C;3  Miscellaneous   TSH   Date Value Ref Range Status   11/26/2018 0.86 0.40 - 4.50 mIU/L Final        Scales:   [employer] PHQ9 DEPRESSION QUESTIONNAIRE 10/10/2018 03/04/2019 07/29/2019   Interest 0 0 0   Depressed 1 0 0   Sleep -- -- --   Energy -- -- --   Appetite -- -- --   Failure -- -- --   Concentration -- -- --   Movement -- -- --   Suicide -- -- --   Summary(Manual) -- -- --   Summary(Calculated) -- -- --   Functional -- -- --   Some recent data might be hidden     No flowsheet data found.    AIMS (02/07/18):  Facial & Oral Movements: 6  Extremity movements: 3  Trunk Movement: 0  Global Judgment: 1  Total: 10    AIMS (02/20/17):  Facial & Oral Movements: 0  Extremity movements:  2  Trunk Movement: 0  Global Judgment: 1  Total: 3    Assessment:   Julie Monroe a37 year old F with a past psychiatric history of schizoaffective d/o, bipolar type, OCD, and unspecified insomnia who presents for follow-up.   She is assessed to be at alow acute risk of suicide.   Clinical Global Impression - Severity:4= Moderately ill  :Suggested Guidelines= Overt symptomatology causing noticeable, but modest, functional impairment. .    Diagnoses:     ICD-10-CM ICD-9-CM   1. Schizoaffective disorder, bipolar type (CMS-HCC)  F25.0 295.70   2. Anxiety  F41.9 300.00   3. Extrapyramidal and movement disorder  G25.9 333.90       # Schizoaffective Disorder, bipolar type - Chronic illness with exacerbation, progression, or treatment side effects - Stable mood and psychotic symptoms on current regimen. Although patient on multiple antipsychotics, this has been long term regimen and she has shown decompensation in past with decreasing dosages. She understands risks of multiple antipsychotics (including but not limited to sedation, metabolic side effects/weight gain/glucose control, EPS). Given that she has started to notice further side effects, she is open to simplifying regimen further. Since she was unable to tolerate decrease in aripiprazole, we will work on slightly decreasing buproprion dosage in an effort to reduce possible contributor to paranoia symptoms and monitor for changes.   - Continue fluphenazine 10 mg PO qDaily  - Continue ziprasidone 160 mg PO nightly w/ food    - takes at bedtime without food-- we discussed importance of taking with 500 cal, but will hold off on doing so at this time given changes below.   - Decrease bupropion XL 150 mg to 100 mg SR PO qDaily for mood   - Continue aripiprazole 30 mg daily     # Anxiety- Stable chronic illness - Stable anxiety on regimen.   - Continue gabapentin 1200 mgPO QHS foranxiety and insomnia  - Continue desvenlafaxine 50 mgqDaily    # OCD - Stable  chronic illness - Improved on current regimen  - desvenlafaxine as above     # EPS - Stable chronic illness - controlled on current regimen  - Continuetrihexyphenidyl(Artane)4 mg POQHSfor EPS    # Sialorrhea - Chronic illness with exacerbation, progression, or treatment side effects - She has noticed excess salivation throughout the day.   - Start sublingual atropine, 1 drop every morning       Patient was seen, examined, and case discussed along with attending physician.The patient and care were discussed with the attending physician Dr. Hazle Nordmann, and we have jointly formulated assessment and recommendations as documented in this note.    Renette Butters, M.D.   Resident Physician

## 2019-12-17 ENCOUNTER — Ambulatory Visit
Admission: RE | Admit: 2019-12-17 | Discharge: 2019-12-17 | Disposition: A | Discharge status: Home / Self Care | Payer: Medicare (Managed Care) | Attending: Internal Medicine | Admitting: Internal Medicine

## 2019-12-17 DIAGNOSIS — Z1231 Encounter for screening mammogram for malignant neoplasm of breast: Secondary | ICD-10-CM | POA: Insufficient documentation

## 2019-12-31 ENCOUNTER — Ambulatory Visit: Payer: Self-pay | Admitting: Ob/Gyn

## 2019-12-31 ENCOUNTER — Telehealth: Payer: Self-pay | Admitting: Cardiovascular Disease

## 2020-01-02 ENCOUNTER — Ambulatory Visit: Payer: Medicare (Managed Care) | Admitting: Internal Medicine

## 2020-01-06 ENCOUNTER — Other Ambulatory Visit: Payer: Self-pay

## 2020-01-12 ENCOUNTER — Telehealth: Payer: Self-pay | Admitting: Ob/Gyn

## 2020-01-12 ENCOUNTER — Telehealth: Payer: Self-pay | Admitting: Internal Medicine

## 2020-01-12 NOTE — Telephone Encounter (Signed)
Pt called would like to know the risks of not having the I&D cyst procedure done     Please assist

## 2020-01-12 NOTE — Telephone Encounter (Signed)
Patient called in to follow up on first message. I advised patient it can take up to 24-48 hours turn around, clinic has acknowledged the message. Thank you.

## 2020-01-12 NOTE — Telephone Encounter (Signed)
Rosie, Please let her know her lab orders, fasting, are in the system for her to get done before her appt. Thank you.

## 2020-01-12 NOTE — Telephone Encounter (Signed)
Husband Arlys John requests call to confirm lab orders (2 weeks in advance of 12 16 2021 visit).   Plans to do lab work this week.  Okay to leave a voice message.  Thank you.

## 2020-01-12 NOTE — Telephone Encounter (Signed)
Called patient spoke with patient husband informed him of leslie rn message. raranda ma

## 2020-01-13 ENCOUNTER — Telehealth: Payer: Self-pay | Admitting: Ob/Gyn

## 2020-01-13 ENCOUNTER — Other Ambulatory Visit: Payer: Self-pay

## 2020-01-13 NOTE — Telephone Encounter (Signed)
S/w pt  reviewed last visit  Want to know the risk of not removing  Discussed management of  Epidermal cyst  rec conservative management  Only need to remove if bothersome.  Pt is feeling better and will monitor for now  Will cancel upcoming appointment.    dGB

## 2020-01-13 NOTE — Telephone Encounter (Signed)
01/13/20 Dr. Learta Codding spoke with pt, appointment cancelled. No longer needed. AM

## 2020-01-16 ENCOUNTER — Ambulatory Visit: Payer: Medicare (Managed Care) | Admitting: Ob/Gyn

## 2020-01-16 ENCOUNTER — Ambulatory Visit: Payer: Self-pay | Admitting: Ob/Gyn

## 2020-01-19 ENCOUNTER — Ambulatory Visit: Payer: Self-pay | Admitting: Student in an Organized Health Care Education/Training Program

## 2020-01-20 ENCOUNTER — Encounter: Payer: Self-pay | Admitting: Psychiatry

## 2020-01-20 ENCOUNTER — Telehealth: Payer: Self-pay | Admitting: Internal Medicine

## 2020-01-20 NOTE — Telephone Encounter (Signed)
Patient would like a call back to confirm how long Alli pill stays in system. Please follow up.

## 2020-01-20 NOTE — Telephone Encounter (Signed)
Meridee Score, MD  Gabriel Rainwater, RN  Caller: Unspecified (Today, 10:07 AM)  Dyanne Carrel,   It might be best to give her an appt.   If she is interested please give an appt for tomorrow at 11:10.   Thanks,   BN

## 2020-01-20 NOTE — Telephone Encounter (Signed)
Called pt and she is wondering how long the medication orlistat lasts in the body. Let pt know what the 1/2 life was but pt wanted message sent to Dr. Loann Quill.    Pt gave example of if she takes the pill 30 min before breakfast but then ends up not eating the meal then how long does the medication last. If she went to eat 3 hours later would she then need to take the pill again 30 min before eating? Will send message to Dr. Loann Quill. Gabriel Rainwater, RN

## 2020-01-20 NOTE — Telephone Encounter (Signed)
Sent pt a MyChart message. Grisel Blumenstock, RN

## 2020-01-20 NOTE — Telephone Encounter (Signed)
For the medication to work best it should be taken half an hour prior to meal. This will help the medication bind to fat particles in the meal and prevent its absorption.    I would not recommend repeating it 3 hours after taking a dose as this might trigger the diarrhea or stool incontinence.   Unfortunately this means you should plan your meals and the medications.   Respectfully,   BN

## 2020-01-20 NOTE — Telephone Encounter (Signed)
Pt stated that Dr. Margette Fast response could be sent via MyChart message so sent her one. Gabriel Rainwater, RN

## 2020-01-22 ENCOUNTER — Telehealth: Payer: Self-pay | Admitting: Internal Medicine

## 2020-01-22 ENCOUNTER — Encounter: Payer: Self-pay | Admitting: Internal Medicine

## 2020-01-22 ENCOUNTER — Other Ambulatory Visit
Admission: RE | Admit: 2020-01-22 | Discharge: 2020-01-22 | Disposition: A | Discharge status: Home / Self Care | Payer: Medicare (Managed Care) | Source: Ambulatory Visit | Attending: Student in an Organized Health Care Education/Training Program | Admitting: Student in an Organized Health Care Education/Training Program

## 2020-01-22 ENCOUNTER — Other Ambulatory Visit
Admit: 2020-01-22 | Discharge: 2020-01-22 | Disposition: A | Discharge status: Home Health | Payer: Medicare (Managed Care) | Source: Ambulatory Visit

## 2020-01-22 ENCOUNTER — Telehealth: Payer: Self-pay | Admitting: Urology

## 2020-01-22 DIAGNOSIS — Z79899 Other long term (current) drug therapy: Secondary | ICD-10-CM | POA: Insufficient documentation

## 2020-01-22 DIAGNOSIS — E782 Mixed hyperlipidemia: Secondary | ICD-10-CM

## 2020-01-22 DIAGNOSIS — Z0000 Encounter for general adult medical examination without abnormal findings: Secondary | ICD-10-CM

## 2020-01-22 DIAGNOSIS — E039 Hypothyroidism, unspecified: Secondary | ICD-10-CM | POA: Insufficient documentation

## 2020-01-22 DIAGNOSIS — E538 Deficiency of other specified B group vitamins: Secondary | ICD-10-CM

## 2020-01-22 DIAGNOSIS — N3941 Urge incontinence: Secondary | ICD-10-CM

## 2020-01-22 DIAGNOSIS — R799 Abnormal finding of blood chemistry, unspecified: Secondary | ICD-10-CM | POA: Insufficient documentation

## 2020-01-22 DIAGNOSIS — Z0001 Encounter for general adult medical examination with abnormal findings: Secondary | ICD-10-CM | POA: Insufficient documentation

## 2020-01-22 LAB — FREE THYROXINE, BLOOD
Free T4: 1.08 ng/dL (ref 0.60–1.12)
Free T4: 1.09 ng/dL (ref 0.60–1.12)

## 2020-01-22 LAB — LIPID(CHOL FRACT) PANEL, BLOOD
Cholesterol: 155 MG/DL (ref <200)
HDL Cholesterol: 76 MG/DL (ref >40)
LDL Cholesterol (calc): 71 MG/DL (ref <160)
Non HDL Cholesterol (calculated): 79 MG/DL (ref <130)
Triglycerides: 39 MG/DL (ref <150)
VLDL Cholesterol (calculated): 8 MG/DL

## 2020-01-22 LAB — COMPREHENSIVE METABOLIC PANEL, BLOOD
ALT: 14 U/L (ref 7–52)
AST: 17 U/L (ref 13–39)
Albumin: 4.1 G/DL (ref 3.7–5.3)
Alk Phos: 99 U/L (ref 34–104)
BUN: 21 mg/dL (ref 7–25)
Bilirubin, Total: 1 mg/dL (ref 0.0–1.4)
CO2: 31 mmol/L (ref 21–31)
Calcium: 9.4 mg/dL (ref 8.6–10.3)
Chloride: 106 mmol/L (ref 98–107)
Creat: 0.7 mg/dL (ref 0.6–1.2)
Electrolyte Balance: 6 mmol/L (ref 2–12)
Glucose: 107 mg/dL (ref 85–125)
Potassium: 4.3 mmol/L (ref 3.5–5.1)
Protein, Total: 6.9 G/DL (ref 6.0–8.3)
Sodium: 143 mmol/L (ref 136–145)
eGFR - high estimate: >60 (ref >59)
eGFR - low estimate: >60 (ref >59)

## 2020-01-22 LAB — GLYCOSYLATED HGB(A1C), BLOOD: Glycated Hgb, A1C: 5.1 % (ref 4.6–5.6)

## 2020-01-22 LAB — HEMOGRAM, BLOOD
Hematocrit: 38.6 % (ref 34.0–44.0)
Hgb: 13.4 G/DL (ref 11.5–15.0)
MCH: 30.8 PG (ref 27.0–33.5)
MCHC: 34.6 G/DL (ref 32.0–35.5)
MCV: 89 FL (ref 81.5–97.0)
MPV: 10.3 FL (ref 7.2–11.7)
PLT Count: 166 10*3/uL (ref 150–400)
RBC: 4.34 10*6/uL (ref 3.70–5.00)
RDW-CV: 13.6 % (ref 11.6–14.4)
White Bld Cell Count: 3.7 10*3/uL — ABNORMAL LOW (ref 4.0–10.5)

## 2020-01-22 LAB — VITAMIN D, 25-OH TOTAL: Vitamin D, 25-Hydroxy, Total: 32.6 ng/mL (ref >30.0)

## 2020-01-22 LAB — TSH, BLOOD
TSH, Ultrasensitive: 0.734 u[IU]/mL (ref 0.450–4.120)
TSH, Ultrasensitive: 0.701 u[IU]/mL (ref 0.450–4.120)

## 2020-01-22 LAB — VITAMIN B12, BLOOD: Vitamin B12 Lvl: 188 PG/ML (ref 180–1241)

## 2020-01-22 NOTE — Telephone Encounter (Signed)
Patient reports at [employer], from [employer]. States she can do fasting labs now, if ordered, in advance of 12 16 2021 visit.  Thank you.

## 2020-01-22 NOTE — Progress Notes (Signed)
Fasting labs ordered

## 2020-01-22 NOTE — Telephone Encounter (Signed)
Orders entered by PCP and pt taken care of at the lab.

## 2020-01-26 ENCOUNTER — Encounter: Payer: Self-pay | Admitting: Urology

## 2020-01-26 ENCOUNTER — Ambulatory Visit: Payer: Self-pay | Admitting: Cardiovascular Disease

## 2020-01-26 ENCOUNTER — Telehealth: Payer: Medicare (Managed Care) | Admitting: Urology

## 2020-01-26 ENCOUNTER — Encounter: Payer: Medicare (Managed Care) | Admitting: Urology

## 2020-01-26 ENCOUNTER — Telehealth: Payer: Self-pay | Admitting: Orthopaedic Surgery

## 2020-01-26 MED ORDER — MIRABEGRON ER 50 MG PO TB24
50.0000 mg | ORAL_TABLET | Freq: Every day | ORAL | 3 refills | Status: AC
Start: 2020-01-26 — End: ?

## 2020-01-26 MED ORDER — MIRABEGRON ER 50 MG PO TB24
50.0000 mg | ORAL_TABLET | Freq: Every day | ORAL | 3 refills | Status: DC
Start: 2020-01-26 — End: 2020-01-26

## 2020-01-26 NOTE — Progress Notes (Signed)
This encounter was opened in error.  Please disregard.

## 2020-01-26 NOTE — Progress Notes (Deleted)
Due to COVID-19 pandemic and a federally declared state of public health emergency, this telemedicine visit was conducted {MODE:26040}. Total time spent was {DURATION OF GLOV:56433}.     Patient confirmed location in [place] and consented to telemedicine visit      Urology Follow Up Note    HISTORY OF PRESENT ILLNESS:  Julie Monroe is a 74 year old-year-old woman here for follow up. She has a history ofurgency urinary incontinence. Cystoscopy on12/16/19demonstrated no lesions. She has been treated withoxybutyninand with PTNS x 4 sessions.She was doing well for some time after the PTNS, but she stopped the treatments because she did not feel it was helping. Then her symptoms started to worsen again with urgency and urgency incontinence.She did not wish to proceed with third line therapy so we started dual medical therapy with myrbetriq and oxybutynin.She then stopped the myrbetriq because she was doing well without it, but then changed and started taking myrbetriq on its own.    Today she reports taking myrbetriq daily. It seems to be working well and her frequency is normal.         Current Outpatient Medications:     ARIPiprazole (ABILIFY) 15 MG tablet, Take 1 tablet (15 mg) by mouth in the morning and at bedtime., Disp: 60 tablet, Rfl: 2    atorvastatin (LIPITOR) 40 MG tablet, Take 1 tablet (40 mg) by mouth daily., Disp: 90 tablet, Rfl: 3    atropine (ISOPTO) 1 % ophthalmic solution, 1 drop by Sublingual route daily. 1 Drop under the tongue daily for excess salivation. May dispense 1 bottle of 5 mL (if not available can give other size)., Disp: 1 each, Rfl: 0    buPROPion (WELLBUTRIN SR) 100 MG SR tablet, Take 1 tablet (100 mg) by mouth daily., Disp: 90 tablet, Rfl: 0    Cholecalciferol (VITAMIN D3) 50 MCG (2000 UT) capsule, Take 1 capsule by mouth daily., Disp: , Rfl:     Clobetasol Propionate (IMPOYZ) 0.025 % CREA, Apply topically., Disp: , Rfl:     cyanocobalamin ER 1000 MCG tablet, Take 0.5 tablets  (500 mcg) by mouth daily., Disp: 90 tablet, Rfl: 3    desvenlafaxine (PRISTIQ) 50 MG TB24, Take 1 tablet (50 mg) by mouth daily., Disp: 90 tablet, Rfl: 3    fluPHENAZine (PROLIXIN) 10 MG tablet, Take 1 tablet (10 mg) by mouth daily., Disp: 90 tablet, Rfl: 1    gabapentin (NEURONTIN) 600 MG tablet, Take 2 tablets (1,200 mg) by mouth at bedtime., Disp: 180 tablet, Rfl: 3    levothyroxine (SYNTHROID) 125 MCG tablet, Take 1 tablet (125 mcg) by mouth every morning (before breakfast)., Disp: 90 tablet, Rfl: 3    losartan (COZAAR) 25 MG tablet, Take 1 tablet (25 mg) by mouth daily., Disp: 90 tablet, Rfl: 3    meloxicam (MOBIC) 15 MG tablet, Take 1 tablet (15 mg) by mouth daily., Disp: 90 tablet, Rfl: 1    mirabegron (MYRBETRIQ) 50 MG ER tablet, Take 50 mg by mouth daily., Disp: 90 tablet, Rfl: 3    nystatin (MYCOSTATIN) 100000 UNIT/GM powder, Apply 1 Application topically 3 times daily. Apply to affected area, Disp: 60 g, Rfl: 3    orlistat (XENICAL) 120 MG capsule, Take 1 capsule (120 mg) by mouth 3 times daily (with meals)., Disp: 90 capsule, Rfl: 0    trihexyphenidyl (ARTANE) 2 MG tablet, Take 2 tablets (4 mg) by mouth nightly., Disp: 180 tablet, Rfl: 2    ziprasidone (GEODON) 80 MG capsule, Take 1 capsule (80 mg)  by mouth 2 times daily (with meals)., Disp: 180 capsule, Rfl: 3    Past Medical History:   Diagnosis Date    Bipolar affect, depressed (CMS-HCC)     Chronic back pain     Hypothyroidism     Insomnia     OAB (overactive bladder)     OCD (obsessive compulsive disorder)     Osteopenia     Pure hypercholesterolemia 10/10/2018    Schizophrenia (CMS-HCC)        Allergies   Allergen Reactions    Lisinopril Cough         Diagnostic Data:  Labs:      Imaging:  ***    ASSESSMENT:  [name] is a 74 year old woman with ***.      PLAN:  ***

## 2020-01-26 NOTE — Telephone Encounter (Signed)
Please request authorization for the scheduled follow up visit scheduled for 02/10/2020, thank you.

## 2020-01-26 NOTE — Progress Notes (Signed)
Due to COVID-19 pandemic and a federally declared state of public health emergency, this telemedicine visit was conducted Audio Only. Total time spent was 11-20 minutes.       Patient confirmed location in [place] and consented to telemedicine visit      Urology Follow Up Note    HISTORY OF PRESENT ILLNESS:  Julie Monroe is a 74 year old-year-old woman here for follow up. She has a history ofurgency urinary incontinence. Cystoscopy on12/16/19demonstrated no lesions. She has been treated withoxybutyninand with PTNS x 4 sessions.She was doing well for some time after the PTNS, but she stopped the treatments because she did not feel it was helping. Then her symptoms started to worsen again with urgency and urgency incontinence.She did not wish to proceed with third line therapy so we started dual medical therapy with myrbetriq and oxybutynin.She then stopped the myrbetriq because she was doing well without it, but then changed and started taking myrbetriq on its own.    Today she reports taking myrbetriq daily. It seems to be working well and her frequency is normal.         Current Outpatient Medications:     ARIPiprazole (ABILIFY) 15 MG tablet, Take 1 tablet (15 mg) by mouth in the morning and at bedtime., Disp: 60 tablet, Rfl: 2    atorvastatin (LIPITOR) 40 MG tablet, Take 1 tablet (40 mg) by mouth daily., Disp: 90 tablet, Rfl: 3    atropine (ISOPTO) 1 % ophthalmic solution, 1 drop by Sublingual route daily. 1 Drop under the tongue daily for excess salivation. May dispense 1 bottle of 5 mL (if not available can give other size)., Disp: 1 each, Rfl: 0    buPROPion (WELLBUTRIN SR) 100 MG SR tablet, Take 1 tablet (100 mg) by mouth daily., Disp: 90 tablet, Rfl: 0    Cholecalciferol (VITAMIN D3) 50 MCG (2000 UT) capsule, Take 1 capsule by mouth daily., Disp: , Rfl:     Clobetasol Propionate (IMPOYZ) 0.025 % CREA, Apply topically., Disp: , Rfl:     cyanocobalamin ER 1000 MCG tablet, Take 0.5 tablets (500 mcg) by  mouth daily., Disp: 90 tablet, Rfl: 3    desvenlafaxine (PRISTIQ) 50 MG TB24, Take 1 tablet (50 mg) by mouth daily., Disp: 90 tablet, Rfl: 3    fluPHENAZine (PROLIXIN) 10 MG tablet, Take 1 tablet (10 mg) by mouth daily., Disp: 90 tablet, Rfl: 1    gabapentin (NEURONTIN) 600 MG tablet, Take 2 tablets (1,200 mg) by mouth at bedtime., Disp: 180 tablet, Rfl: 3    levothyroxine (SYNTHROID) 125 MCG tablet, Take 1 tablet (125 mcg) by mouth every morning (before breakfast)., Disp: 90 tablet, Rfl: 3    losartan (COZAAR) 25 MG tablet, Take 1 tablet (25 mg) by mouth daily., Disp: 90 tablet, Rfl: 3    meloxicam (MOBIC) 15 MG tablet, Take 1 tablet (15 mg) by mouth daily., Disp: 90 tablet, Rfl: 1    mirabegron (MYRBETRIQ) 50 MG ER tablet, Take 50 mg by mouth daily., Disp: 90 tablet, Rfl: 3    nystatin (MYCOSTATIN) 100000 UNIT/GM powder, Apply 1 Application topically 3 times daily. Apply to affected area, Disp: 60 g, Rfl: 3    orlistat (XENICAL) 120 MG capsule, Take 1 capsule (120 mg) by mouth 3 times daily (with meals)., Disp: 90 capsule, Rfl: 0    trihexyphenidyl (ARTANE) 2 MG tablet, Take 2 tablets (4 mg) by mouth nightly., Disp: 180 tablet, Rfl: 2    ziprasidone (GEODON) 80 MG capsule, Take 1 capsule (  80 mg) by mouth 2 times daily (with meals)., Disp: 180 capsule, Rfl: 3    Past Medical History:   Diagnosis Date    Bipolar affect, depressed (CMS-HCC)     Chronic back pain     Hypothyroidism     Insomnia     OAB (overactive bladder)     OCD (obsessive compulsive disorder)     Osteopenia     Pure hypercholesterolemia 10/10/2018    Schizophrenia (CMS-HCC)        Allergies   Allergen Reactions    Lisinopril Cough         Diagnostic Data:  Labs:  CMP normal    ASSESSMENT:  Julie Monroe is a 74 year old woman with urgency urinary incontinence, doing well on myrbetriq.      PLAN:  Cont with myrbetriq. Follow up with me PRN

## 2020-01-27 ENCOUNTER — Ambulatory Visit: Payer: Medicare (Managed Care) | Admitting: Internal Medicine

## 2020-01-27 ENCOUNTER — Other Ambulatory Visit: Payer: Self-pay

## 2020-01-29 ENCOUNTER — Encounter: Payer: Self-pay | Admitting: Internal Medicine

## 2020-01-29 ENCOUNTER — Ambulatory Visit: Payer: Medicare (Managed Care) | Attending: Internal Medicine | Admitting: Internal Medicine

## 2020-01-29 VITALS — BP 131/63 | HR 76 | Temp 97.6°F | Resp 16 | Wt 270.9 lb

## 2020-01-29 DIAGNOSIS — L304 Erythema intertrigo: Secondary | ICD-10-CM | POA: Insufficient documentation

## 2020-01-29 DIAGNOSIS — E039 Hypothyroidism, unspecified: Secondary | ICD-10-CM | POA: Insufficient documentation

## 2020-01-29 DIAGNOSIS — E559 Vitamin D deficiency, unspecified: Secondary | ICD-10-CM | POA: Insufficient documentation

## 2020-01-29 DIAGNOSIS — E538 Deficiency of other specified B group vitamins: Secondary | ICD-10-CM | POA: Insufficient documentation

## 2020-01-29 DIAGNOSIS — E7800 Pure hypercholesterolemia, unspecified: Secondary | ICD-10-CM | POA: Insufficient documentation

## 2020-01-29 MED ORDER — LOSARTAN POTASSIUM 25 MG OR TABS
25.0000 mg | ORAL_TABLET | Freq: Every day | ORAL | 3 refills | Status: DC
Start: 2020-01-29 — End: 2020-02-09

## 2020-01-29 MED ORDER — CYANOCOBALAMIN 1000 MCG OR TBCR
1000.0000 ug | EXTENDED_RELEASE_TABLET | Freq: Every day | ORAL | 3 refills | Status: AC
Start: 2020-01-29 — End: ?

## 2020-01-29 MED ORDER — CYANOCOBALAMIN 1000 MCG/ML IJ SOLN
1000.0000 ug | Freq: Once | INTRAMUSCULAR | Status: AC
Start: 2020-01-29 — End: 2020-01-29
  Administered 2020-01-29 (×2): 1000 ug via INTRAMUSCULAR

## 2020-01-29 MED ORDER — ATORVASTATIN CALCIUM 40 MG OR TABS
40.0000 mg | ORAL_TABLET | Freq: Every day | ORAL | 3 refills | Status: AC
Start: 2020-01-29 — End: ?

## 2020-01-29 MED ORDER — VITAMIN D-3 25 MCG (1000 UT) PO CAPS
1.0000 | ORAL_CAPSULE | Freq: Every day | ORAL | 3 refills | Status: AC
Start: 2020-01-29 — End: ?

## 2020-01-29 MED ORDER — NYSTATIN 100000 UNIT/GM EX POWD
1.0000 | Freq: Three times a day (TID) | CUTANEOUS | 3 refills | Status: AC
Start: 2020-01-29 — End: ?

## 2020-01-29 MED ORDER — LEVOTHYROXINE SODIUM 125 MCG OR TABS
125.0000 ug | ORAL_TABLET | Freq: Every day | ORAL | 3 refills | Status: AC
Start: 2020-01-29 — End: ?

## 2020-01-29 NOTE — Patient Instructions (Addendum)
Screening studies:  Last dexa 12/11/2018: normal bone density --> repeat in 11/2020  Last mammo 12/17/19: BI-RADS-1--> repeat in 12/2020  Last colonoscopy: 12/2018, recall 5 yrs --> repeat in 12/2023  Pap smear: following with gynecologist      Please get your repeat CBC and Vitamin B12 levels checked in 8-12 weeks.    Your next routine fasting blood work can be done in 3-6 months.      Vaccines:   (Shingles) Herpes Zoster Vaccine (ZOSTAVAX)01/15/2015, 04/22/2014   COVID-19 (Pfizer)11/12/2019, 03/29/2019, 03/08/2019   Influenza Vaccine (High Dose) >=65 Years11/05/2019, 10/16/2018, 11/04/2017, ... (4 more)   Influenza Vaccine (Unspecified)10/26/2015   Pneumococcal 13 Vaccine (PREVNAR-13)11/03/2015, 10/19/2014   Pneumococcal 23 Vaccine (PNEUMOVAX-23)04/30/2017, 10/17/2015, 12/17/2012   Tdap1/02/2016   Shingrix (2-dose vaccine) is due     Please get your neuropsychiatric testing for your memory

## 2020-01-29 NOTE — Progress Notes (Signed)
FOLLOW UP VISIT NOTE    Date of Visit: January 29, 2020    Chief Complaint: Follow Up (4 MONTHS) and Medication Refill      History of Present Illness: Julie Monroe is a 74 year old female here for Follow Up (4 MONTHS) and Medication Refill    Date of LAST Visit: July 29, 2019    Labs from 01/22/20 reviewed:  CMP nl  LFTs nl  Chol nl  TFTs nl  Vit B12 188  Vit D 33  AIC 5.1  CBC: WBC 3.7, rest nl    SMG 12/17/19: BIRADS 1    # Leukopenia - WBC is 3.7, prev have been in 5.5 to 5.7  FH of leukemia   No frequent infections    # Vitamin D - level 33 not on a supplement    # Vitamin B12 defic - level is low end of normal, not on supplement. + fatigue.    # Hypothyroid - labs as noted above. On Synthroid 155mg.     # HLD - on Lipitor 447mQHS, labs as noted above.    # CAD - seeing cards, rare palpitation.     # Aortic valve stenosis - following with cardiology for this, monitoring with 2D echo.       HCM:  Last dexa 12/11/2018: normal bone density  Last mammo 12/17/19: BI-RADS-1  Last colonoscopy: 12/2018, recall 5 yrs  Last pap smear: sees outside GYN    Vaccines:  reviewed this encounter.      (Shingles) Herpes Zoster Vaccine (ZOSTAVAX)01/15/2015, 04/22/2014   COVID-19 (Pfizer)11/12/2019, 03/29/2019, 03/08/2019   Influenza Vaccine (High Dose) >=65 Years11/05/2019, 10/16/2018, 11/04/2017, ... (4 more)   Influenza Vaccine (Unspecified)10/26/2015   Pneumococcal 13 Vaccine (PREVNAR-13)11/03/2015, 10/19/2014   Pneumococcal 23 Vaccine (PNEUMOVAX-23)04/30/2017, 10/17/2015, 12/17/2012   Tdap1/02/2016        Last dexa 12/11/2018: normal bone density    Narrative & Impression  DXA BONE DENSITOMETRY    Exam Date: 12/11/2018 10:01 AM    HISTORY AND RISK FACTORS: [age]ear old female on vitamin D and calcium supplements. The patient has been diagnosed with hypothyroidism.    Reason for exam: osteopenia; Other specified disorders of bone density and structure, left ankle and foot; Other specified disorders of bone density and  structure, unspecified site.    Comparison: Accurate comparison cannot be made to outside DEXA scan, due to the difference in equipment.    Technique: The patient underwent PA lumbar spine and proximal left femur DXA bone density studies on a [employer], software version 13.5.3.1. No technical difficulties were encountered. A review of the images did not suggest fracture, degenerative change, or artifact severe enough to significantly affect the interpretation of the numerical results. The measurements at each major site are listed below.    T-score BMD (g/cm2)         Lumbar spine: 0.4 1.089   L1-L4    Femoral neck: -0.6 0.781     Left hip: -0.6 0.872       IMPRESSION:  Normal bone mineral density, based on the T-scores of -1.0 or greater, which fulfill the WHO criteria for normal bone density in postmenopausal women.           Instructions         Return in about 4 months (around 11/28/2019).    Plaese hold any BIOTIN (Vit B7) for one week prior to your labs in 3 months.  Past Medical History:  Patient Active Problem List   Diagnosis    Schizoaffective disorder, bipolar type (CMS-HCC)    Insomnia    Morbid obesity (CMS-HCC)    Tremor    Osteoarthrosis    Hypothyroidism    Obsessive-compulsive disorder, unspecified type    Anxiety    Extrapyramidal and movement disorder    Pure hypercholesterolemia    Chest pain, unspecified type    RBBB    Mild aortic valve stenosis    Mixed hyperlipidemia    Actinic keratosis    Solar lentigo    Nutritional counseling    Exercise counseling    Cough due to ACE inhibitor         Past Surgical History:  Past Surgical History:   Procedure Laterality Date    Biilat. Knee replacement      Rt toe surgery      TOTAL KNEE ARTHROPLASTY Bilateral          Social  Health:  Social History     Socioeconomic History    Marital status: Married     Spouse name: Not on file    Number of children: Not on file    Years of education: Not on file    Highest education level: Not on file   Occupational History    Not on file   Tobacco Use    Smoking status: Former Smoker     Packs/day: 2.00     Years: 30.00     Pack years: 60.00    Smokeless tobacco: Never Used   Substance and Sexual Activity    Alcohol use: No    Drug use: No    Sexual activity: Never   Other Topics Concern    Not on file   Social History Narrative    Not on file     Social Determinants of Health     Financial Resource Strain:     Difficulty of Paying Living Expenses: Not on file   Food Insecurity:     Worried About Charity fundraiser in the Last Year: Not on file    [employer] of Food in the Last Year: Not on file   Transportation Needs:     Lack of Transportation (Medical): Not on file    Lack of Transportation (Non-Medical): Not on file   Physical Activity:     Days of Exercise per Week: Not on file    Minutes of Exercise per Session: Not on file   Stress:     Feeling of Stress : Not on file   Social Connections:     Frequency of Communication with Friends and Family: Not on file    Frequency of Social Gatherings with Friends and Family: Not on file    Attends Religious Services: Not on file    Active Member of Clubs or Organizations: Not on file    Attends Archivist Meetings: Not on file    Marital Status: Not on file   Intimate Partner Violence:     Fear of Current or Ex-Partner: Not on file    Emotionally Abused: Not on file    Physically Abused: Not on file    Sexually Abused: Not on file   Housing Stability:     Unable to Pay for Housing in the Last Year: Not on file    Number of [place] in the Last Year: Not on file    Unstable Housing in the Last Year: Not on  file         Family Health:  Family History   Problem Relation Age of Onset    Other Mother          [place]    Cancer Brother         Leukemia    Cancer Father         Leukemia    Heart Disease Father     Hypertension Father     No Known Problems Sister     Cancer Brother         Bladder Cancer         Allergies:   Allergies   Allergen Reactions    Lisinopril Cough         Current Meds:   Outpatient Medications Marked as Taking for the 01/29/20 encounter (Office Visit) with Charlotte Crumb, MD   Medication Sig Dispense Refill    ARIPiprazole (ABILIFY) 15 MG tablet Take 1 tablet (15 mg) by mouth in the morning and at bedtime. 60 tablet 2    atorvastatin (LIPITOR) 40 MG tablet Take 1 tablet (40 mg) by mouth daily. 90 tablet 3    buPROPion (WELLBUTRIN SR) 100 MG SR tablet Take 1 tablet (100 mg) by mouth daily. 90 tablet 0    desvenlafaxine (PRISTIQ) 50 MG TB24 Take 1 tablet (50 mg) by mouth daily. 90 tablet 3    fluPHENAZine (PROLIXIN) 10 MG tablet Take 1 tablet (10 mg) by mouth daily. 90 tablet 1    gabapentin (NEURONTIN) 600 MG tablet Take 2 tablets (1,200 mg) by mouth at bedtime. 180 tablet 3    levothyroxine (SYNTHROID) 125 MCG tablet Take 1 tablet (125 mcg) by mouth every morning (before breakfast). 90 tablet 3    losartan (COZAAR) 25 MG tablet Take 1 tablet (25 mg) by mouth daily. 90 tablet 3    meloxicam (MOBIC) 15 MG tablet Take 1 tablet (15 mg) by mouth daily. 90 tablet 1    mirabegron (MYRBETRIQ) 50 MG ER tablet Take 50 mg by mouth daily. 90 tablet 3    nystatin (MYCOSTATIN) 100000 UNIT/GM powder Apply 1 Application topically 3 times daily. Apply to affected area 60 g 3    orlistat (XENICAL) 120 MG capsule Take 1 capsule (120 mg) by mouth 3 times daily (with meals). 90 capsule 0    trihexyphenidyl (ARTANE) 2 MG tablet Take 2 tablets (4 mg) by mouth nightly. 180 tablet 2         Review of Systems: 12-point ROS is negative except as stated above in the HPI    Vital Signs:  BP 131/63 (BP Location: Left arm, BP Patient Position: Sitting, BP cuff size: Large)    Pulse 76    Temp 97.6 F (36.4  C) (Temporal Artery)    Resp 16    Wt 122.9 kg (270 lb 15.1 oz)    LMP  (LMP Unknown)    BMI 43.73 kg/m     PHYSICAL EXAM:  General: Alert, cooperative, and interactive  Pulmonary: clear bilaterally; no wheezes, rales or rhonchi  Cardiovascular: regular rhythm; regular rate; normal S1,S2  Abdominal: soft; nondistended; nontender to palpation; normal bowel sounds  Neurological: alert and oriented times 3  Psychiatric Details:  Affect normal; mood matches affect    Diagnostic Data:  Albumin   Date Value Ref Range Status   01/22/2020 4.1 3.7 - 5.3 G/DL Final     ALT   Date Value Ref Range Status   01/22/2020 14 7 -  52 U/L Final     AST   Date Value Ref Range Status   01/22/2020 17 13 - 39 U/L Final     BUN   Date Value Ref Range Status   01/22/2020 21 7 - 25 mg/dL Final     Calcium   Date Value Ref Range Status   01/22/2020 9.4 8.6 - 10.3 mg/dL Final     Chloride   Date Value Ref Range Status   01/22/2020 106 98 - 107 mmol/L Final     Cholesterol   Date Value Ref Range Status   01/22/2020 155 <200 MG/DL Final     Comment:     Desirable: <200 mg/dL  Borderline High: 200-239 mg/dL  High: >240 mg/dL       CO2   Date Value Ref Range Status   01/22/2020 31 21 - 31 mmol/L Final     Creat   Date Value Ref Range Status   01/22/2020 0.7 0.6 - 1.2 mg/dL Final     eGFR - low estimate   Date Value Ref Range Status   01/22/2020 >60 >59 Final     eGFR - high estimate   Date Value Ref Range Status   01/22/2020 >60 >59 Final     Comment:     (Unit: mL/min/1.73 sq mtr)  The calculated GFR is an estimate and is NOT an accurate reflection of GFR in   patients on dialysis. The accuracy of estimated GFR is also affected by muscle   mass, and medications that affect renal tubular secretion of creatinine.  If estimated GFR will directly affect clinical decision making, consider using cystatin C to estimate GFR, and/or consult with nephrology. eGFR was calculated using the MDRD equation (2006).       eGFR non-Afr.American   Date Value Ref  Range Status   11/26/2018 74 > OR = 60 mL/min/1.9m Final     Glucose   Date Value Ref Range Status   01/22/2020 107 85 - 125 mg/dL Final     Comment:        Normal Fasting Glucose:  <100 mg/dL  Impaired Fasting Glucose: 100-125 mg/dL  Provisional DX of diabetes(must be confirmed) >125 mg/dL       HDL Cholesterol   Date Value Ref Range Status   11/26/2018 80 > OR = 50 mg/dL Final     Hgb   Date Value Ref Range Status   01/22/2020 13.4 11.5 - 15.0 G/DL Final     Glycated Hgb, A1C   Date Value Ref Range Status   01/22/2020 5.1 4.6 - 5.6 % Final     Comment:     (NOTE)  Reference values for HgA1c:        Non-Diabetic: 4.6 - 5.6%    HbA1c cutoffs for assessing diabetes:      Increased risk for diabetes: 5.7 - 6.4%      Diabetes:  >6.4%    ADA recommended HbA1c goals in treatment of diabetes:      Adults: <7.0%      Children and adolescents with type I Diabetes: <7.5%      Comment: A lower goal of <7.0% is reasonable if it      can be achieved without excessive hypoglycemia.      Goals should be individualized.    Reference: Diabetes Care January 2017.       LDL-Cholesterol   Date Value Ref Range Status   11/26/2018 87 mg/dL (calc) Final  Comment:     Reference range: <100     Desirable range <100 mg/dL for primary prevention;    <70 mg/dL for patients with CHD or diabetic patients   with > or = 2 CHD risk factors.     LDL-C is now calculated using the Martin-Hopkins   calculation, which is a validated novel method providing   better accuracy than the Friedewald equation in the   estimation of LDL-C.   Cresenciano Genre et al. Annamaria Helling. 0623;762(83): 2061-2068   (http://education.[facility].com/faq/FAQ164)       PLT Count   Date Value Ref Range Status   01/22/2020 166 150 - 400 THOUS/MCL Final     Potassium   Date Value Ref Range Status   01/22/2020 4.3 3.5 - 5.1 mmol/L Final     Sodium   Date Value Ref Range Status   01/22/2020 143 136 - 145 mmol/L Final   11/26/2018 141 135 - 146 mmol/L Final     Triglycerides   Date  Value Ref Range Status   01/22/2020 39 <150 MG/DL Final     Comment:     Normal: <150 mg/dL  Borderline High: 150-199 mg/dL  High: 200-499 mg/dL  Very high: > or = 500 mg/dL       WBC   Date Value Ref Range Status   07/09/2018 4.6 3.8 - 10.8 Thousand/uL Final       Imaging Data:  See above    Assessment and Plan:  1. Vitamin B12 deficiency  Low end of normal, so will give IM supplement today, then can start on daily oral replacement of 1039mg per day.   - cyanocobalamin (VITAMIN B12) injection 1,000 mcg    2. Vitamin D deficiency  On low end, increase current intake by 1000 international units daily.    3. Hypothyroidism, unspecified type  Continue on current regimen .  - levothyroxine (SYNTHROID) 125 MCG tablet; Take 1 tablet (125 mcg) by mouth every morning (before breakfast).  Dispense: 90 tablet; Refill: 3    4. Pure hypercholesterolemia  Well controlled, ccm.     5. Intertrigo  Well managed with topical antifungal, ccm.  - nystatin (MYCOSTATIN) 100000 UNIT/GM powder; Apply 1 Application topically 3 times daily. Apply to affected area  Dispense: 60 g; Refill: 3

## 2020-02-02 ENCOUNTER — Ambulatory Visit: Payer: Self-pay | Admitting: Cardiovascular Disease

## 2020-02-02 ENCOUNTER — Telehealth: Payer: Self-pay | Admitting: Student in an Organized Health Care Education/Training Program

## 2020-02-03 ENCOUNTER — Other Ambulatory Visit (INDEPENDENT_AMBULATORY_CARE_PROVIDER_SITE_OTHER): Payer: Medicare (Managed Care)

## 2020-02-03 ENCOUNTER — Ambulatory Visit (INDEPENDENT_AMBULATORY_CARE_PROVIDER_SITE_OTHER): Payer: Medicare (Managed Care) | Admitting: Psychiatry

## 2020-02-03 LAB — COMPLETE 2D ECHO
AO Mean Gradient: 27 mmHg
AO Peak Velocity: 3.32 m/s
Aortic Valve Area: 1.2 cm2
LA Volume Index: 39.8 ml/m2
LV Ejection Fraction: 60.6 %
LV Stroke Volume Index: 41.8 ml/m2
Mitral Valve Area: 3.68 cm2
TR Velocity: 2.39 m/s

## 2020-02-03 MED ORDER — ARIPIPRAZOLE 15 MG OR TABS
15.0000 mg | ORAL_TABLET | Freq: Two times a day (BID) | ORAL | 2 refills | Status: AC
Start: 2020-02-03 — End: ?

## 2020-02-03 MED ORDER — FLUPHENAZINE HCL 10 MG OR TABS
10.0000 mg | ORAL_TABLET | Freq: Every day | ORAL | 1 refills | Status: AC
Start: 2020-02-03 — End: ?

## 2020-02-03 MED ORDER — ATROPINE SULFATE 1 % OP SOLN
1.0000 [drp] | Freq: Every day | OPHTHALMIC | 0 refills | Status: AC
Start: 2020-02-03 — End: ?

## 2020-02-03 MED ORDER — BUPROPION SR (BID) 100 MG OR TB12
100.0000 mg | ORAL_TABLET | Freq: Every day | ORAL | 0 refills | Status: DC
Start: 2020-02-03 — End: 2020-03-10

## 2020-02-03 NOTE — Progress Notes (Deleted)
Outpatient Psychiatry  Progress Note    Visit Type: {Video vs In-Person vs Telephone:29233}    Julie Monroe is a 74 year old female with medical and psychiatric history significant for schizoaffective d/o bipolar type, OCD, and unspecified insomnia presenting for follow up. Last seen by outpatient psychiatry on 12/15/2019.    Subjective  Interval History: ***     Avg Hours of Sleep: ***  Energy Level: ***  Appetite/Weight Changes: ***  Concentration/Focus/Attention:  ***  Caffeine Intake:  ***  Alcohol/Tobacco Intake: ***  Other Drugs:  ***    Drug Side Effects:  ***      {Psychiatric ROS (Optional):29169}    {Additional HPI Elements (Optional):29089}    Objective  Outpatient Medications Prior to Visit   Medication Sig Dispense Refill   . ARIPiprazole (ABILIFY) 15 MG tablet Take 1 tablet (15 mg) by mouth in the morning and at bedtime. 60 tablet 2   . atorvastatin (LIPITOR) 40 MG tablet Take 1 tablet (40 mg) by mouth daily. 90 tablet 3   . atropine (ISOPTO) 1 % ophthalmic solution 1 drop by Sublingual route daily. 1 Drop under the tongue daily for excess salivation. May dispense 1 bottle of 5 mL (if not available can give other size). 1 each 0   . buPROPion (WELLBUTRIN SR) 100 MG SR tablet Take 1 tablet (100 mg) by mouth daily. 90 tablet 0   . Cholecalciferol (VITAMIN D3) 25 MCG (1000 UT) capsule Take 1 capsule (1,000 Units) by mouth daily. 90 capsule 3   . Clobetasol Propionate (IMPOYZ) 0.025 % CREA Apply topically.     . cyanocobalamin ER 1000 MCG tablet Take 1 tablet (1,000 mcg) by mouth daily. 90 tablet 3   . cyanocobalamin ER 1000 MCG tablet Take 0.5 tablets (500 mcg) by mouth daily. 90 tablet 3   . desvenlafaxine (PRISTIQ) 50 MG TB24 Take 1 tablet (50 mg) by mouth daily. 90 tablet 3   . fluPHENAZine (PROLIXIN) 10 MG tablet Take 1 tablet (10 mg) by mouth daily. 90 tablet 1   . gabapentin (NEURONTIN) 600 MG tablet Take 2 tablets (1,200 mg) by mouth at bedtime. 180 tablet 3   . levothyroxine (SYNTHROID)  125 MCG tablet Take 1 tablet (125 mcg) by mouth every morning (before breakfast). 90 tablet 3   . losartan (COZAAR) 25 MG tablet Take 1 tablet (25 mg) by mouth daily. 90 tablet 3   . meloxicam (MOBIC) 15 MG tablet Take 1 tablet (15 mg) by mouth daily. 90 tablet 1   . mirabegron (MYRBETRIQ) 50 MG ER tablet Take 50 mg by mouth daily. 90 tablet 3   . nystatin (MYCOSTATIN) 100000 UNIT/GM powder Apply 1 Application topically 3 times daily. Apply to affected area 60 g 3   . orlistat (XENICAL) 120 MG capsule Take 1 capsule (120 mg) by mouth 3 times daily (with meals). 90 capsule 0   . trihexyphenidyl (ARTANE) 2 MG tablet Take 2 tablets (4 mg) by mouth nightly. 180 tablet 2   . ziprasidone (GEODON) 80 MG capsule Take 1 capsule (80 mg) by mouth 2 times daily (with meals). 180 capsule 3     No facility-administered medications prior to visit.       Vital Signs:  LMP  (LMP Unknown)     Mental Status Exam    Data: {I reviewed vs no data to review:29088}        Assessment & Plan   Julie Monroe is a 74 year old female with  medical and psychiatric history significant for *** presenting for follow up.   Risk Stratification: Assessed to be at a {HIGH LOW:15916} acute risk of suicide.   Clinical Global Impression - Severity: {IP CGI-S:17765}.    # Schizoaffective disorder, bipolar type - Chronic, {Complexity:29435}  Assessment: Chronic illness with exacerbation, progression, or treatment side effects - Stable mood and psychotic symptoms on current regimen. Although patient on multiple antipsychotics, this has been long term regimen and she has shown decompensation in past with decreasing dosages. She understands risks of multiple antipsychotics (including but not limited to sedation, metabolic side effects/weight gain/glucose control, EPS). Given that she has started to notice further side effects, she is open to simplifying regimen further. Since she was unable to tolerate decrease in aripiprazole, we will work on slightly  decreasing buproprion dosage in an effort to reduce possible contributor to paranoia symptoms and monitor for changes.   Plan:  - Continue fluphenazine 10 mg PO qDaily  - Continue ziprasidone 160 mg PO nightly w/ food               - takes at bedtime without food-- we discussed importance of taking with 500 cal, but will hold off on doing so at this time given changes below.   - Decrease bupropion XL 150 mg to 100 mg SR PO qDaily for mood   - Continue aripiprazole 30 mg daily     # Anxiety - Chronic, stable  Assessment: ***  Plan:  - Continue gabapentin 1200 mgPO QHS foranxiety and insomnia  - Continue desvenlafaxine 50 mgqDaily    # OCD - Chronic, stable  Assessment: ***  Plan:  - desvenlafaxine as above     # EPS - Stable chronic illness - controlled on current regimen  - Continuetrihexyphenidyl(Artane)4 mg POQHSfor EPS    # Sialorrhea - Chronic illness with exacerbation, progression, or treatment side effects - She has noticed excess salivation throughout the day.   - Start sublingual atropine, 1 drop every morning     Risks, benefits, and alternatives of medications were previously discussed and she expressed understanding.      Level of care: Not a danger to self/others or gravely disabled. Future oriented, contracts for safety. Continue outpatient.    Labs: [facility]    Medical: Defer to PCP and specialists. No acute issues.      Therapy: {Referral To:21304}.      In-session psychotherapy:    Patient-Stated Goals:   1. ***  2. ***    Treatment modality(ies) employed: {Treatment Modalities:[phone]}    Progress to date: {Therapy Progress:21302}    Treatment plan: {Plan:21303}      I spent {Minutes Psychotherapy:30172::"***"} minutes providing psychotherapy during this encounter.      Elmer Ramp, MD  Resident Physician

## 2020-02-03 NOTE — Progress Notes (Signed)
Outpatient Psychiatry Progress Note    ID:  Julie Monroe a69 year old Female with a past psychiatric history of schizoaffective d/o, bipolar type, OCD and unspecified insomnia who presents for follow-up.     Interval History:  Last seen by Dr. Ardis Hughs on 12/15/19 with plan to decrease buproprion XL 150 mg to 100 mg SR daily and start atropine drops for sialorrhea    Here today because she is switching care to [facility] beginning next year and needs refills on medications.   Did not notice a difference, return of symptoms or bad side effects from decreasing buproprion.     Was hearing the voice of a former psychiatrist until recently and has now stopped.     Sleep: good quality with current regimen, at least 6 hours (at times 1 awakening)    Social Hx:  Is retired and keeps busy by doing things around the house. Is not exercising very much, but plans to increase frequency of walks to achieve better weight control. Two daughters: ages 32 & 42.     Substance: Denies ETOH, tobaccos, cannabis, caffeine.     Medication side effects: sialorrhea, never started atropine and does not have it at home      Current Outpatient Medications:     ARIPiprazole (ABILIFY) 15 MG tablet, Take 1 tablet (15 mg) by mouth in the morning and at bedtime., Disp: 60 tablet, Rfl: 2    atorvastatin (LIPITOR) 40 MG tablet, Take 1 tablet (40 mg) by mouth daily., Disp: 90 tablet, Rfl: 3    atropine (ISOPTO) 1 % ophthalmic solution, 1 drop by Sublingual route daily. 1 Drop under the tongue daily for excess salivation. May dispense 1 bottle of 5 mL (if not available can give other size)., Disp: 1 each, Rfl: 0    buPROPion (WELLBUTRIN SR) 100 MG SR tablet, Take 1 tablet (100 mg) by mouth daily., Disp: 90 tablet, Rfl: 0    Cholecalciferol (VITAMIN D3) 25 MCG (1000 UT) capsule, Take 1 capsule (1,000 Units) by mouth daily., Disp: 90 capsule, Rfl: 3    Clobetasol Propionate (IMPOYZ) 0.025 % CREA, Apply topically., Disp: , Rfl:     cyanocobalamin  ER 1000 MCG tablet, Take 1 tablet (1,000 mcg) by mouth daily., Disp: 90 tablet, Rfl: 3    cyanocobalamin ER 1000 MCG tablet, Take 0.5 tablets (500 mcg) by mouth daily., Disp: 90 tablet, Rfl: 3    desvenlafaxine (PRISTIQ) 50 MG TB24, Take 1 tablet (50 mg) by mouth daily., Disp: 90 tablet, Rfl: 3    fluPHENAZine (PROLIXIN) 10 MG tablet, Take 1 tablet (10 mg) by mouth daily., Disp: 90 tablet, Rfl: 1    gabapentin (NEURONTIN) 600 MG tablet, Take 2 tablets (1,200 mg) by mouth at bedtime., Disp: 180 tablet, Rfl: 3    levothyroxine (SYNTHROID) 125 MCG tablet, Take 1 tablet (125 mcg) by mouth every morning (before breakfast)., Disp: 90 tablet, Rfl: 3    losartan (COZAAR) 25 MG tablet, Take 1 tablet (25 mg) by mouth daily., Disp: 90 tablet, Rfl: 3    meloxicam (MOBIC) 15 MG tablet, Take 1 tablet (15 mg) by mouth daily., Disp: 90 tablet, Rfl: 1    mirabegron (MYRBETRIQ) 50 MG ER tablet, Take 50 mg by mouth daily., Disp: 90 tablet, Rfl: 3    nystatin (MYCOSTATIN) 100000 UNIT/GM powder, Apply 1 Application topically 3 times daily. Apply to affected area, Disp: 60 g, Rfl: 3    orlistat (XENICAL) 120 MG capsule, Take 1 capsule (120 mg) by mouth  3 times daily (with meals)., Disp: 90 capsule, Rfl: 0    trihexyphenidyl (ARTANE) 2 MG tablet, Take 2 tablets (4 mg) by mouth nightly., Disp: 180 tablet, Rfl: 2    ziprasidone (GEODON) 80 MG capsule, Take 1 capsule (80 mg) by mouth 2 times daily (with meals)., Disp: 180 capsule, Rfl: 3    Vital Signs:  LMP  (LMP Unknown)     Mental Status Exam:    Speech: Normal rate, tone and volume  Mood: "good"  Thought Process: Linear, goal-directed   Thought Content: Denies suicidal or homicidal ideation, intent and plan. Denies visual hallucinations, and does not appear distractible and internally preoccupied. +ve intermittent AH  Attention Span and Concentration: Not distracted in conversation  Language: Able to name objects  Fund of Knowledge: Average  Memory: Both recent and remote  memories are intact in context of conversation  Orientation: Intact to person, place and situation  Insight/Judgment:Improved    LABS/ OTHER STUDIES    CHEM 7  Lab Results   Component Value Date/Time    NA 141 11/26/2018 05:12 AM    K 4.3 01/22/2020 10:10 AM    K 4.5 11/26/2018 05:12 AM    CL 106 01/22/2020 10:10 AM    CL 105 11/26/2018 05:12 AM    CO2 31 01/22/2020 10:10 AM    BUN 21 01/22/2020 10:10 AM    BUN 18 11/26/2018 05:12 AM    GLU 107 01/22/2020 10:10 AM    GLU 90 11/26/2018 05:12 AM    [place] 9.4 01/22/2020 10:10 AM    [place] 9.7 11/26/2018 05:12 AM     LFT's  Lab Results   Component Value Date/Time    AST 17 01/22/2020 10:10 AM    AST 15 11/26/2018 05:12 AM    ALT 14 01/22/2020 10:10 AM    ALT 9 11/26/2018 05:12 AM    ALB 4.1 01/22/2020 10:10 AM    ALB 4.2 11/26/2018 05:12 AM    TP 6.9 11/26/2018 05:12 AM     CBC  Lab Results   Component Value Date/Time    WBC 4.6 07/09/2018 07:41 AM    HGB 13.4 01/22/2020 10:10 AM    HGB 13.5 07/09/2018 07:41 AM    HCT 38.6 01/22/2020 10:10 AM    HCT 40.4 07/09/2018 07:41 AM    PLT 166 01/22/2020 10:10 AM    PLT 182 07/09/2018 07:41 AM     LIPID PANEL  Lab Results   Component Value Date/Time    CHOL 155 01/22/2020 10:10 AM    CHOL 182 11/26/2018 05:12 AM    HDL 80 11/26/2018 05:12 AM    TRIG 39 01/22/2020 10:10 AM    TRIG 66 11/26/2018 05:12 AM     HGBA1c  No components found for: A1C;3  Miscellaneous   TSH   Date Value Ref Range Status   11/26/2018 0.86 0.40 - 4.50 mIU/L Final        Scales:   [place] PHQ9 DEPRESSION QUESTIONNAIRE 10/10/2018 03/04/2019 07/29/2019   Interest 0 0 0   Depressed 1 0 0   Sleep -- -- --   Energy -- -- --   Appetite -- -- --   Failure -- -- --   Concentration -- -- --   Movement -- -- --   Suicide -- -- --   Summary(Manual) -- -- --   Summary(Calculated) -- -- --   Functional -- -- --   Some recent data might be hidden  No flowsheet data found.    AIMS (02/07/18):  Facial & Oral Movements: 6  Extremity movements: 3  Trunk Movement: 0  Global Judgment:  1  Total: 10    AIMS (02/20/17):  Facial & Oral Movements: 0  Extremity movements: 2  Trunk Movement: 0  Global Judgment: 1  Total: 3    Assessment:   Gwenda Monroe a34 year old F with a past psychiatric history of schizoaffective d/o, bipolar type, OCD, and unspecified insomnia who presents for follow-up.   She is assessed to be at alow acute risk of suicide.   Clinical Global Impression - Severity:4= Moderately ill  :Suggested Guidelines= Overt symptomatology causing noticeable, but modest, functional impairment. .    Diagnoses:   No diagnosis found.    # Schizoaffective Disorder, bipolar type - Chronic illness with exacerbation, progression, or treatment side effects - Stable mood and psychotic symptoms on current regimen. Although patient on multiple antipsychotics, this has been long term regimen and she has shown decompensation in past with decreasing dosages. She understands risks of multiple antipsychotics (including but not limited to sedation, metabolic side effects/weight gain/glucose control, EPS). Given that she has started to notice further side effects, she is open to simplifying regimen further. Since she was unable to tolerate decrease in aripiprazole, buproprion was decreased to reduce possible contributor to paranoia symptoms and monitor for changes. As patient is transferring care, we will make sure she has adequate refills of all psychiatric medications, but will not make any medication adjustments at this time.   - Continue fluphenazine 10 mg PO qDaily  - Continue ziprasidone 160 mg PO nightly w/ food    - takes at bedtime without food-- we discussed importance of taking with 500 cal, but will hold off on doing so at this time given changes below.   - Continue bupropion  100 mg SR PO qDaily for mood   - Continue aripiprazole 30 mg daily     # Anxiety- Stable chronic illness - Stable anxiety on regimen.   - Continue gabapentin 1200 mgPO QHS foranxiety and insomnia  - Continue  desvenlafaxine 50 mgqDaily    # OCD - Stable chronic illness - Improved on current regimen  - desvenlafaxine as above     # EPS - Stable chronic illness - controlled on current regimen  - Continuetrihexyphenidyl(Artane)4 mg POQHSfor EPS    # Sialorrhea - Chronic illness with exacerbation, progression, or treatment side effects - She has noticed excess salivation throughout the day. Patient has been prescribed atropine previously, but never picked it up from the pharmacy.   - Start sublingual atropine, 1 drop every morning       Patient was seen, examined, and case discussed along with attending physician.The patient and care were discussed with the attending physician Dr. Hazle Nordmann, and we have jointly formulated assessment and recommendations as documented in this note.    Sherwood Gambler, MD  Resident Physician, PGY-2

## 2020-02-04 ENCOUNTER — Ambulatory Visit: Payer: Self-pay | Admitting: Student in an Organized Health Care Education/Training Program

## 2020-02-09 ENCOUNTER — Ambulatory Visit (INDEPENDENT_AMBULATORY_CARE_PROVIDER_SITE_OTHER): Payer: Medicare (Managed Care) | Admitting: Cardiovascular Disease

## 2020-02-09 ENCOUNTER — Encounter: Payer: Self-pay | Admitting: Cardiovascular Disease

## 2020-02-09 VITALS — BP 143/69 | HR 82 | Temp 97.8°F | Resp 18 | Ht 66.0 in | Wt 272.4 lb

## 2020-02-09 DIAGNOSIS — I10 Essential (primary) hypertension: Secondary | ICD-10-CM

## 2020-02-09 HISTORY — DX: Essential (primary) hypertension: I10

## 2020-02-09 MED ORDER — LOSARTAN POTASSIUM 50 MG OR TABS
50.0000 mg | ORAL_TABLET | Freq: Every day | ORAL | 3 refills | Status: AC
Start: 2020-02-09 — End: ?

## 2020-02-09 NOTE — Progress Notes (Signed)
CARDIOLOGY NEW PATIENT VIST    Referring: Charlotte Crumb    HPI:  Julie Monroe is a 74 year old female here for a cardiovascular evaluation.  Patient has history of heart murmur and RBBB.  She had echocardiogram 03/2018 which showed mild aortic valve stenosis.    She had chest pain 10/16 and was evaluated in [facility].  Workup was negative.  No stress test.  No recurrence of chest pain.  Symptoms were atypical.  Patient mentioned that her father had MI at age 36 and her older sister has atrial fibrillation.    Patient is taking her medications, she is not very active.  She is dieting and is taking orlistat    Patient completed vaccination for covid 05/2019  Past Medical History:  Past Medical History:   Diagnosis Date    Bipolar affect, depressed (CMS-HCC)     Chronic back pain     Essential hypertension, benign 02/09/2020    Hypothyroidism     Insomnia     OAB (overactive bladder)     OCD (obsessive compulsive disorder)     Osteopenia     Pure hypercholesterolemia 10/10/2018    Schizophrenia (CMS-HCC)         Medications:    Current Outpatient Medications:     ARIPiprazole (ABILIFY) 15 MG tablet, Take 1 tablet (15 mg) by mouth in the morning and at bedtime., Disp: 60 tablet, Rfl: 2    atorvastatin (LIPITOR) 40 MG tablet, Take 1 tablet (40 mg) by mouth daily., Disp: 90 tablet, Rfl: 3    atropine (ISOPTO) 1 % ophthalmic solution, 1 drop by Sublingual route daily. 1 Drop under the tongue daily for excess salivation. May dispense 1 bottle of 5 mL (if not available can give other size)., Disp: 1 each, Rfl: 0    buPROPion (WELLBUTRIN SR) 100 MG SR tablet, Take 1 tablet (100 mg) by mouth daily., Disp: 90 tablet, Rfl: 0    Cholecalciferol (VITAMIN D3) 25 MCG (1000 UT) capsule, Take 1 capsule (1,000 Units) by mouth daily., Disp: 90 capsule, Rfl: 3    Clobetasol Propionate (IMPOYZ) 0.025 % CREA, Apply topically., Disp: , Rfl:     cyanocobalamin ER 1000 MCG tablet, Take 1 tablet (1,000 mcg) by mouth daily.,  Disp: 90 tablet, Rfl: 3    cyanocobalamin ER 1000 MCG tablet, Take 0.5 tablets (500 mcg) by mouth daily., Disp: 90 tablet, Rfl: 3    desvenlafaxine (PRISTIQ) 50 MG TB24, Take 1 tablet (50 mg) by mouth daily., Disp: 90 tablet, Rfl: 3    fluPHENAZine (PROLIXIN) 10 MG tablet, Take 1 tablet (10 mg) by mouth daily., Disp: 90 tablet, Rfl: 1    gabapentin (NEURONTIN) 600 MG tablet, Take 2 tablets (1,200 mg) by mouth at bedtime., Disp: 180 tablet, Rfl: 3    levothyroxine (SYNTHROID) 125 MCG tablet, Take 1 tablet (125 mcg) by mouth every morning (before breakfast)., Disp: 90 tablet, Rfl: 3    losartan (COZAAR) 50 MG tablet, Take 1 tablet (50 mg) by mouth daily., Disp: 90 tablet, Rfl: 3    meloxicam (MOBIC) 15 MG tablet, Take 1 tablet (15 mg) by mouth daily., Disp: 90 tablet, Rfl: 1    mirabegron (MYRBETRIQ) 50 MG ER tablet, Take 50 mg by mouth daily., Disp: 90 tablet, Rfl: 3    nystatin (MYCOSTATIN) 100000 UNIT/GM powder, Apply 1 Application topically 3 times daily. Apply to affected area, Disp: 60 g, Rfl: 3    orlistat (XENICAL) 120 MG capsule, Take 1 capsule (120  mg) by mouth 3 times daily (with meals)., Disp: 90 capsule, Rfl: 0    trihexyphenidyl (ARTANE) 2 MG tablet, Take 2 tablets (4 mg) by mouth nightly., Disp: 180 tablet, Rfl: 2    ziprasidone (GEODON) 80 MG capsule, Take 1 capsule (80 mg) by mouth 2 times daily (with meals)., Disp: 180 capsule, Rfl: 3    Allergies:  Allergies   Allergen Reactions    Lisinopril Cough       Past Surgical History:  Past Surgical History:   Procedure Laterality Date    Biilat. Knee replacement      Rt toe surgery      TOTAL KNEE ARTHROPLASTY Bilateral        Family History:  Family History   Problem Relation Name Age of Onset    Other Mother          Wegener    Cancer Brother          Leukemia    Cancer Father          Leukemia    Heart Disease Father      Hypertension Father      No Known Problems Sister      Cancer Brother          Bladder Cancer       Social  History:  Social History     Socioeconomic History    Marital status: Married     Spouse name: Not on file    Number of children: Not on file    Years of education: Not on file    Highest education level: Not on file   Occupational History    Not on file   Tobacco Use    Smoking status: Former Smoker     Packs/day: 2.00     Years: 30.00     Pack years: 60.00    Smokeless tobacco: Never Used   Substance and Sexual Activity    Alcohol use: No    Drug use: No    Sexual activity: Never   Other Topics Concern    Not on file   Social History Narrative    Not on file     Social Determinants of Health     Financial Resource Strain:     Difficulty of Paying Living Expenses: Not on file   Food Insecurity:     Worried About Charity fundraiser in the Last Year: Not on file    [employer] of Food in the Last Year: Not on file   Transportation Needs:     Lack of Transportation (Medical): Not on file    Lack of Transportation (Non-Medical): Not on file   Physical Activity:     Days of Exercise per Week: Not on file    Minutes of Exercise per Session: Not on file   Stress:     Feeling of Stress : Not on file   Social Connections:     Frequency of Communication with Friends and Family: Not on file    Frequency of Social Gatherings with Friends and Family: Not on file    Attends Religious Services: Not on file    Active Member of Clubs or Organizations: Not on file    Attends Archivist Meetings: Not on file    Marital Status: Not on file   Intimate Partner Violence:     Fear of Current or Ex-Partner: Not on file    Emotionally Abused: Not on file  Physically Abused: Not on file    Sexually Abused: Not on file   Housing Stability:     Unable to Pay for Housing in the Last Year: Not on file    Number of Places Lived in the Last Year: Not on file    Unstable Housing in the Last Year: Not on file       Review of Systems:  (+) See HPI  (-) HA, fever, rigors, chills, palpitations, dizziness,  syncope, CP, jaw/arm pain/numbness/paraesthesias, SOB/DOE, cough, hemoptysis, extremity swelling, PND, abdominal pain, N&V, BRBPR, melena, dysuria, hematuria, cold/heat intolerance, tremor, balance difficulties     Physical Exam:   BP 143/69 (BP Location: Left arm, BP Patient Position: Sitting)    Pulse 82    Temp 97.8 F (36.6 C)    Resp 18    Ht 5' 6" (1.676 m)    Wt 123.6 kg (272 lb 6.4 oz)    LMP  (LMP Unknown)    SpO2 97%    BMI 43.97 kg/m     General Appearance: Alert, oriented, cooperative, no acute distress.  Obese  HEENT:  Normocephalic, atraumatic, PERRL, conjunctiva/corneas clear  Neck: Supple, non-tender, normal thyroid, no  carotid bruits or JVD  Lungs: Clear to auscultation bilaterally, no rales or rhonchi  Chest Wall: No tenderness or deformity  Heart: Regular rhythm, normal S1 and S2  2/6 early peaking systolic murmur at the base, no gallop or rub  Abdomen: Soft, non-tende, bowel sounds active, no masses, or organomegaly  Extremities: No cyanosis, clubbing or edema  Pulses: 2+ and symmetric all extremities  Skin: Skin color, texture, turgor normal, no rashes or lesions  Neurologic: CNII-XII intact, normal strength, otherwise grossly intact.    Cardiology Studies:    Echo 04/08/2018   Summary:   1. Normal left ventricular systolic function. The left ventricular ejection fraction is 58% by Biplane.   2. Mild aortic valve stenosis.   3. Mildly enlarged right ventricle.   4. Left atrium mildly dilated.     ECG INTERPRETATION     Preliminary   SINUS RHYTHM RIGHT BUNDLE BRANCH BLOCK [120+ ms QRS DURATION, UPRIGHT V1, 40+ ms S IN I/aVL/V4/V5/V6] LEFT ANTERIOR FASCICULAR BLOCK [QRS AXIS <= -45, QR IN I, RS IN II] VOLTAGE CRITERIA FOR LVH [MEETS CRITERIA IN ONE OF: R(aVL), S(V1), R(V5), R(V5/V6)+S(V1)] ABNORMAL ECG UNCONFIRMED REPORT     Echo 07/01/2019   Summary:   1. Left atrium mildly dilated.   2. Normal left ventricular systolic function. The left ventricular ejection fraction is 62% by  Biplane.   3. Mild to moderate aortic valve stenosis.   4. DI 0.38, AVA 1.2-1.3 cm2 by continuity, mean gradient 24 mmHg, AoV max velocity 3.1 m/s from the apical five chamber.   5. Moderate concentric left ventricular hypertrophy.   6. Left ventricular ejection fraction is 59% by 3D.   7. Moderate diastolic LV dysfunction.     Echo 02/03/2020   Summary:   1. Normal left ventricular systolic function. The left ventricular ejection fraction is 61% by Biplane.   2. Moderate aortic valve stenosis.   3. Left ventricular ejection fraction is 61% by 3D.   4. Left atrium mildly dilated.   5. Normal right ventricular size and systolic function.         Lab Review:   Lab Results   Component Value Date    SODIUM 143 01/22/2020    K 4.3 01/22/2020    CL 106 01/22/2020    CO2 31  01/22/2020    BUN 21 01/22/2020    CREAT 0.7 01/22/2020    GFR >60 01/22/2020    GLU 107 01/22/2020    CA 9.4 01/22/2020     Lab Results   Component Value Date    TPROT 6.9 01/22/2020    ALB 4.1 01/22/2020    ALK 99 01/22/2020    AST 17 01/22/2020    ALT 14 01/22/2020    TBILI 1.0 01/22/2020     Lab Results   Component Value Date    CHOL 155 01/22/2020    TRIG 39 01/22/2020    HDLCH2 76 01/22/2020    HDL 80 11/26/2018    LDLCALC 71 01/22/2020    LDL 87 11/26/2018    VLDLC2 8 01/22/2020     Lab Results   Component Value Date    WBCCOUNT 3.7 (L) 01/22/2020    HGB 13.4 01/22/2020    HCT 38.6 01/22/2020    MCV 89.0 01/22/2020    MCHC 34.6 01/22/2020     Lab Results   Component Value Date    TSH 0.86 11/26/2018    FREET4 1.09 01/22/2020    A1C 5.1 01/22/2020         Assessment and Plan:  Problem List Items Addressed This Visit        Cardiovascular    RBBB - Primary    Moderate aortic valve stenosis    Relevant Orders    Basic Metabolic Panel, Blood Green Plasma Separator Tube    Mixed hyperlipidemia    Relevant Orders    Basic Metabolic Panel, Blood Green Plasma Separator Tube    Essential hypertension, benign        Patient has progression of  aortic valve stenosis to moderate.  I will repeat echocardiogram in 6 months.  Unfortunately their insurance is changed and [facility].    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  Monitor    Chest pain, resolved.  No further workup necessary at this time    HLD on statins. Continue atorvastatin to 76m    HTN.  Not well controlled.  Increase losartan to 550m   IrAlvino ChapelMD

## 2020-02-10 ENCOUNTER — Ambulatory Visit: Payer: Medicare (Managed Care) | Admitting: Orthopaedic Surgery

## 2020-02-18 ENCOUNTER — Ambulatory Visit: Payer: Medicare (Managed Care) | Admitting: Internal Medicine

## 2020-03-10 ENCOUNTER — Telehealth: Payer: Self-pay | Admitting: Student in an Organized Health Care Education/Training Program

## 2020-03-10 DIAGNOSIS — F250 Schizoaffective disorder, bipolar type: Secondary | ICD-10-CM

## 2020-03-10 MED ORDER — BUPROPION SR (BID) 100 MG OR TB12
100.0000 mg | ORAL_TABLET | Freq: Every day | ORAL | 0 refills | Status: AC
Start: 2020-03-10 — End: ?

## 2020-03-10 NOTE — Telephone Encounter (Signed)
Patient called per her insurance she had to change Dr office and she is waiting to be seen she needs a refill for 5 pills to get her by on Bupropion 100 mg  Please send to [facility] [address].

## 2020-03-10 NOTE — Telephone Encounter (Signed)
Refill sent.

## 2020-03-11 NOTE — Progress Notes (Signed)
Attending Note:     Subjective/Objective:   I reviewed the history. Patient examined with the resident.     Review of Systems (ROS): As per the resident's note.  Past Medical, Family, Social History:  As per the resident's  note.    I concur with the resident's exam.     Assessment and Plan:  Reviewed with the resident physician and discussed with the patient.  I agree with the resident's plan as documented.     Please see the resident's note for further details.    Tasmin Exantus, M.D.  Attending Physician  Professor of Clinical Psychiatry

## 2020-03-31 ENCOUNTER — Ambulatory Visit: Payer: Medicare (Managed Care) | Admitting: Student in an Organized Health Care Education/Training Program

## 2020-04-23 ENCOUNTER — Telehealth: Payer: Self-pay

## 2020-04-23 NOTE — Telephone Encounter (Addendum)
PA for Trihexyphenidyl HCl 2MG  submitted 04/22/20        KEY:  06/22/20       PA APPROVED 04/23/20        PT and Pharmacy notified.

## 2020-04-26 ENCOUNTER — Encounter: Payer: Self-pay | Admitting: Student in an Organized Health Care Education/Training Program

## 2020-04-26 DIAGNOSIS — E039 Hypothyroidism, unspecified: Secondary | ICD-10-CM

## 2020-05-24 ENCOUNTER — Ambulatory Visit: Payer: No Typology Code available for payment source | Admitting: Dermatology

## 2020-07-06 ENCOUNTER — Ambulatory Visit: Payer: Medicare (Managed Care) | Admitting: Ophthalmology

## 2021-11-17 LAB — COMPREHENSIVE METABOLIC PANEL, BLOOD
ALT: 16 U/L (ref 7–52)
AST: 21 U/L (ref 13–39)
Albumin: 3.5 g/dL — ABNORMAL LOW (ref 3.7–5.3)
Alk Phos: 86 U/L (ref 34–104)
BUN: 26 mg/dL — ABNORMAL HIGH (ref 7–25)
Bilirubin, Total: 1 mg/dL (ref 0.0–1.4)
CO2: 28 mmol/L (ref 21–31)
Calcium: 9.6 mg/dL (ref 8.6–10.3)
Chloride: 103 mmol/L (ref 98–107)
Creat: 0.5 mg/dL — ABNORMAL LOW (ref 0.6–1.2)
Electrolyte Balance: 7 mmol/L (ref 2–12)
Glucose: 108 mg/dL (ref 85–125)
Glucose: NORMAL mg/dL (ref 85–125)
Potassium: 3.8 mmol/L (ref 3.5–5.1)
Protein, Total: 6.9 g/dL (ref 6.0–8.3)
Sodium: 138 mmol/L (ref 136–145)
eGFR - high estimate: >60 (ref >59)
eGFR - low estimate: >60 (ref >59)

## 2021-11-17 LAB — CBC WITH DIFF, BLOOD
ANC automated: 4.8 10*3/uL (ref 2.0–8.1)
Basophils %: 0.2 %
Basophils Absolute: 0 10*3/uL (ref 0.0–0.2)
Eosinophils %: 6.3 %
Eosinophils Absolute: 0.4 10*3/uL (ref 0.0–0.5)
Hematocrit: 31.1 % — ABNORMAL LOW (ref 34.0–44.0)
Hgb: 10.9 g/dL — ABNORMAL LOW (ref 11.5–15.0)
Lymphocytes %: 9.5 %
Lymphocytes Absolute: 0.6 10*3/uL — ABNORMAL LOW (ref 0.9–3.3)
MCH: 32.2 pg (ref 27.0–33.5)
MCHC: 35.1 g/dL (ref 32.0–35.5)
MCV: 91.8 fL (ref 81.5–97.0)
MPV: 9.6 fL (ref 7.2–11.7)
Monocytes %: 8.5 %
Monocytes Absolute: 0.5 10*3/uL (ref 0.0–0.8)
Neutrophils % (A): 75.5 %
PLT Count: 182 10*3/uL (ref 150–400)
RBC: 3.38 10*6/uL — ABNORMAL LOW (ref 3.70–5.00)
RDW-CV: 14.3 % (ref 11.6–14.4)
White Bld Cell Count: 6.3 10*3/uL (ref 4.0–10.5)

## 2021-11-17 LAB — PREALBUMIN (TRANSTHYRETIN), BLOOD: Prealbumin: 9.8 mg/dL — ABNORMAL LOW (ref 17–34)

## 2021-11-17 LAB — THYROID FUNCTION PANEL (ULTRASENSITIVE TSH + FREE T4)
Free T4: 1.17 ng/dL — ABNORMAL HIGH (ref 0.60–1.12)
TSH, Ultrasensitive: 0.866 u[IU]/mL (ref 0.450–4.120)

## 2022-05-10 IMAGING — CR L-SPINE 4 VWS MIN
5 series · 5 of 5 positions shown · non-contrast
Comparison: None

Lumbar spine 5 views
HISTORY: Lumbar radiculopathy

[t lumbar spine ap]
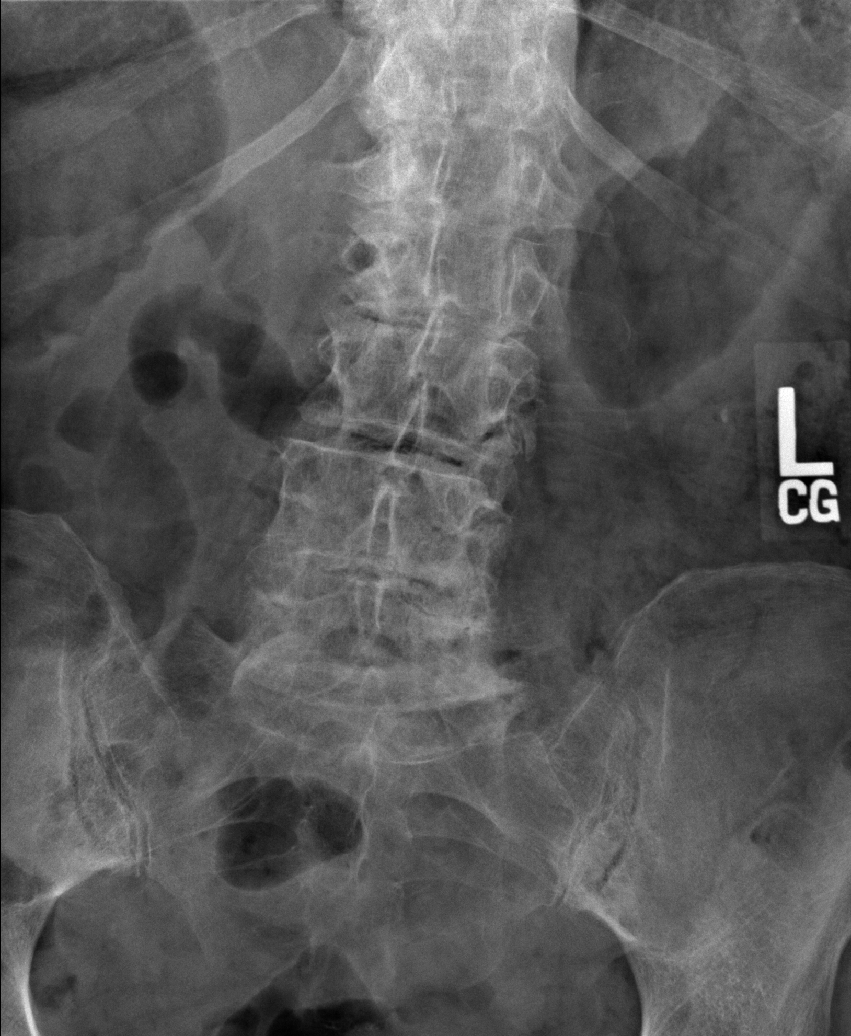

[t lumbar spine obl (1 of 2)]
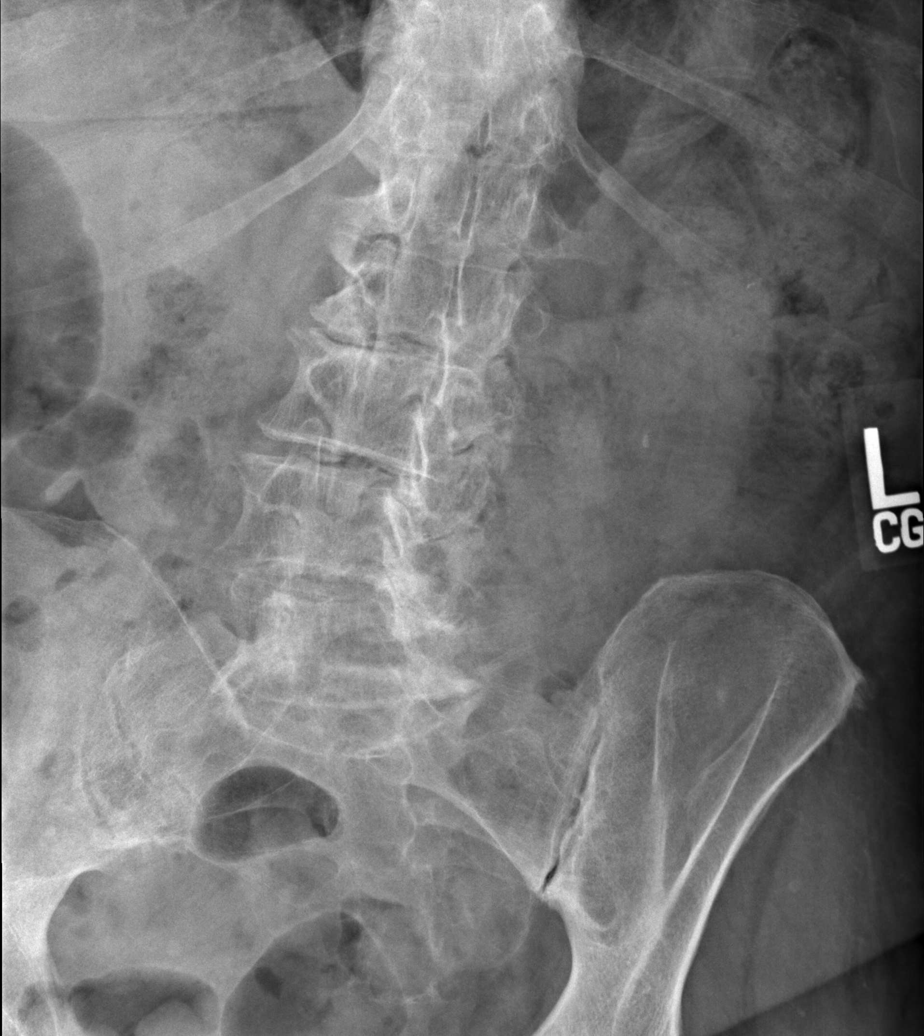

[t lumbar spine obl (2 of 2)]
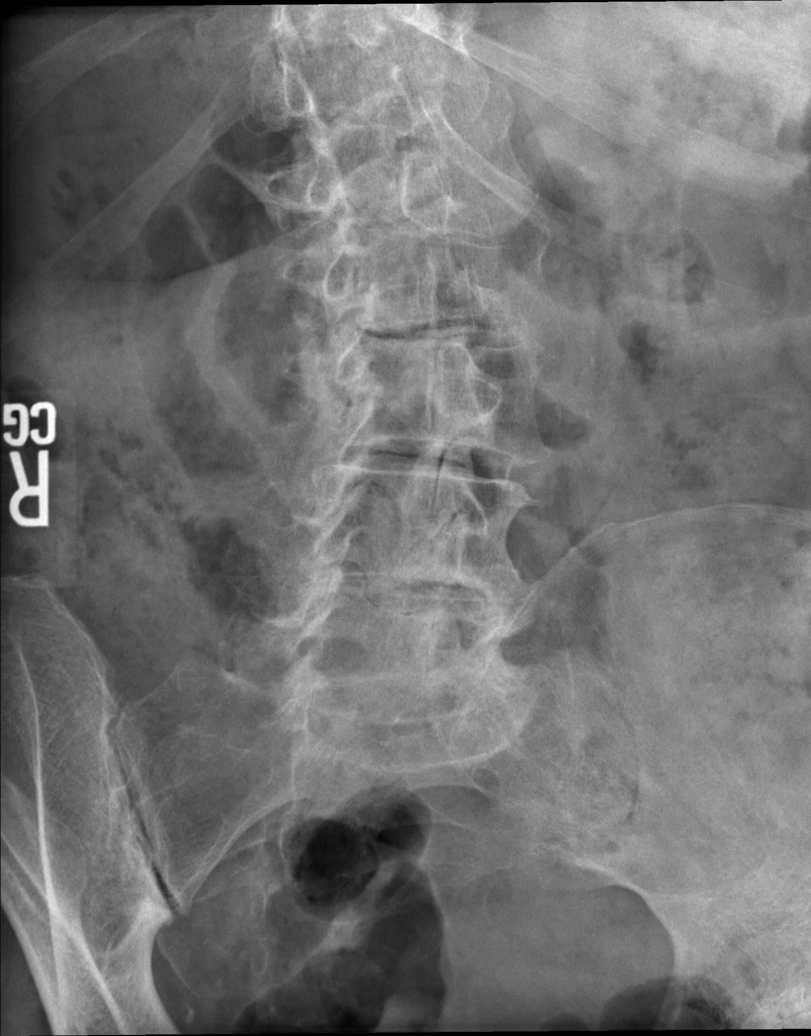

[t lumbar spine lat]
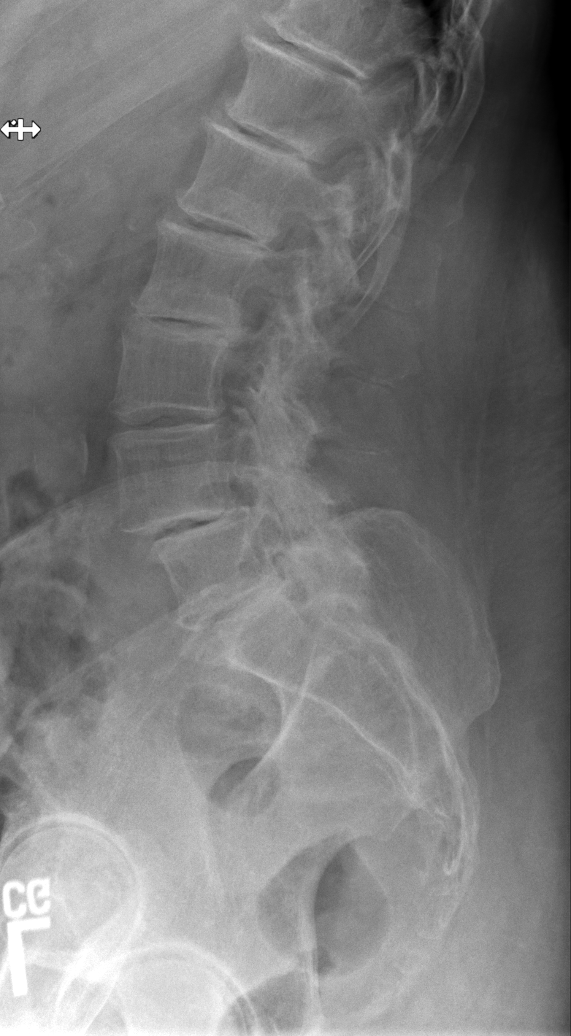

[t lumbar l-5 s-1 spot]
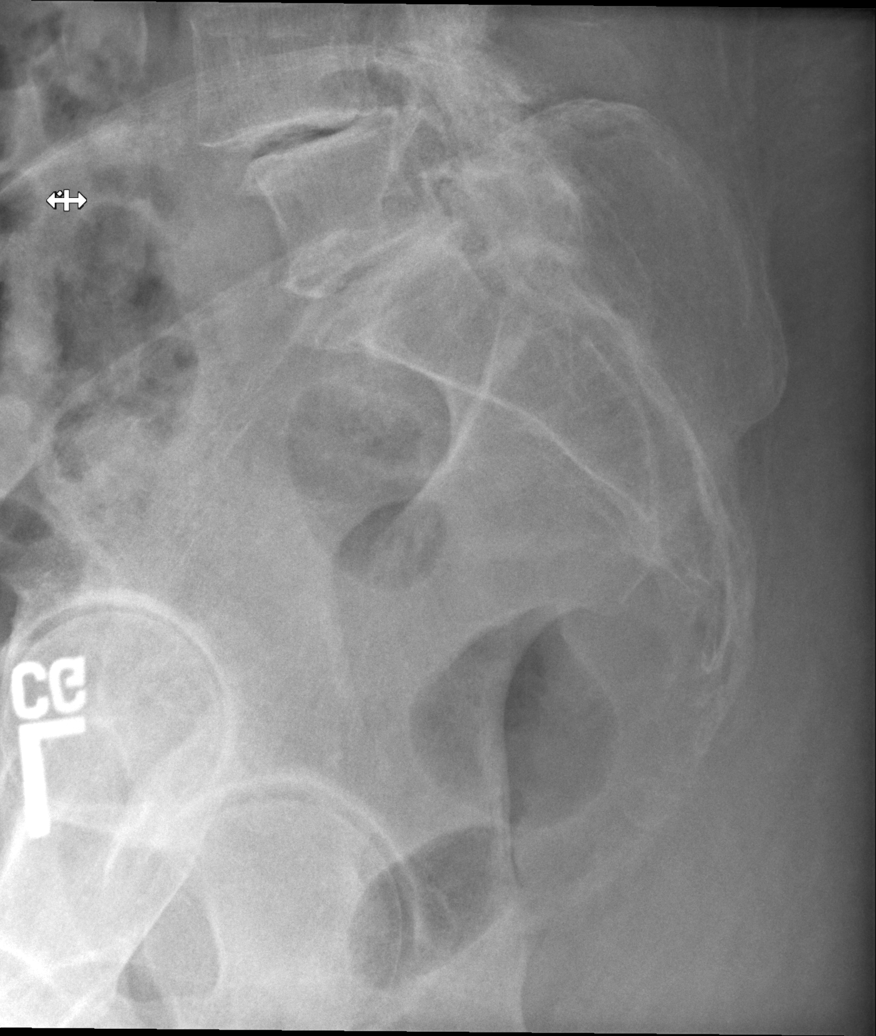

[5 of 5 positions shown; findings below may reference images not displayed]

FINDINGS: There are five lumbar segments. Diffuse severe degenerative disc spondylosis and facet arthropathy change. Moderate SIJ DJD. No wedge compression deformity. Minimal subluxations likely degenerative.
IMPRESSION: Diffuse severe disease.
